# Patient Record
Sex: Female | Born: 1975
Health system: Southern US, Community
[De-identification: ages and names within clinical notes are randomized; demographics above are authoritative.]

## PROBLEM LIST (undated history)

## (undated) DIAGNOSIS — N390 Urinary tract infection, site not specified: Secondary | ICD-10-CM

## (undated) DIAGNOSIS — D649 Anemia, unspecified: Secondary | ICD-10-CM

## (undated) DIAGNOSIS — E079 Disorder of thyroid, unspecified: Secondary | ICD-10-CM

## (undated) DIAGNOSIS — U071 COVID-19: Secondary | ICD-10-CM

## (undated) DIAGNOSIS — K3580 Unspecified acute appendicitis: Principal | ICD-10-CM

## (undated) HISTORY — DX: Anemia, unspecified: D64.9

## (undated) HISTORY — DX: Urinary tract infection, site not specified: N39.0

## (undated) HISTORY — DX: COVID-19: U07.1

## (undated) HISTORY — PX: APPENDECTOMY: SHX54

## (undated) HISTORY — DX: Unspecified acute appendicitis: K35.80

## (undated) HISTORY — PX: COLONOSCOPY: SHX174

---

## 2002-10-19 ENCOUNTER — Inpatient Hospital Stay (HOSPITAL_COMMUNITY): Admission: AD | Admit: 2002-10-19 | Discharge: 2002-10-19 | Payer: Self-pay | Admitting: Family Medicine

## 2006-03-20 ENCOUNTER — Emergency Department (HOSPITAL_COMMUNITY): Admission: EM | Admit: 2006-03-20 | Discharge: 2006-03-21 | Payer: Self-pay | Admitting: Emergency Medicine

## 2006-08-26 ENCOUNTER — Inpatient Hospital Stay (HOSPITAL_COMMUNITY): Admission: AD | Admit: 2006-08-26 | Discharge: 2006-08-28 | Payer: Self-pay | Admitting: Obstetrics

## 2007-04-10 ENCOUNTER — Inpatient Hospital Stay (HOSPITAL_COMMUNITY): Admission: AD | Admit: 2007-04-10 | Discharge: 2007-04-10 | Payer: Self-pay | Admitting: Obstetrics & Gynecology

## 2007-10-05 ENCOUNTER — Inpatient Hospital Stay (HOSPITAL_COMMUNITY): Admission: AD | Admit: 2007-10-05 | Discharge: 2007-10-05 | Payer: Self-pay | Admitting: Obstetrics & Gynecology

## 2008-05-18 ENCOUNTER — Emergency Department (HOSPITAL_COMMUNITY): Admission: EM | Admit: 2008-05-18 | Discharge: 2008-05-19 | Payer: Self-pay | Admitting: Emergency Medicine

## 2008-05-18 ENCOUNTER — Inpatient Hospital Stay (HOSPITAL_COMMUNITY): Admission: AD | Admit: 2008-05-18 | Discharge: 2008-05-18 | Payer: Self-pay | Admitting: Obstetrics & Gynecology

## 2008-05-24 ENCOUNTER — Ambulatory Visit (HOSPITAL_COMMUNITY): Admission: RE | Admit: 2008-05-24 | Discharge: 2008-05-24 | Payer: Self-pay | Admitting: Family Medicine

## 2011-04-26 LAB — WET PREP, GENITAL: Trich, Wet Prep: NONE SEEN

## 2011-04-26 LAB — POCT PREGNANCY, URINE: Preg Test, Ur: NEGATIVE

## 2011-05-14 LAB — POCT PREGNANCY, URINE
Operator id: 22333
Preg Test, Ur: NEGATIVE

## 2011-05-14 LAB — WET PREP, GENITAL: Trich, Wet Prep: NONE SEEN

## 2012-01-13 DIAGNOSIS — K3580 Unspecified acute appendicitis: Secondary | ICD-10-CM

## 2012-01-13 HISTORY — DX: Unspecified acute appendicitis: K35.80

## 2012-01-17 ENCOUNTER — Ambulatory Visit (HOSPITAL_COMMUNITY)
Admission: EM | Admit: 2012-01-17 | Discharge: 2012-01-20 | Disposition: A | Discharge status: Home / Self Care | Payer: Medicaid Other | Source: Ambulatory Visit | Attending: Surgery | Admitting: Surgery

## 2012-01-17 ENCOUNTER — Encounter (HOSPITAL_COMMUNITY): Payer: Self-pay | Admitting: Emergency Medicine

## 2012-01-17 DIAGNOSIS — K37 Unspecified appendicitis: Secondary | ICD-10-CM

## 2012-01-17 DIAGNOSIS — K3580 Unspecified acute appendicitis: Secondary | ICD-10-CM

## 2012-01-17 DIAGNOSIS — R1013 Epigastric pain: Secondary | ICD-10-CM | POA: Insufficient documentation

## 2012-01-17 DIAGNOSIS — R1031 Right lower quadrant pain: Secondary | ICD-10-CM | POA: Insufficient documentation

## 2012-01-17 DIAGNOSIS — K8020 Calculus of gallbladder without cholecystitis without obstruction: Secondary | ICD-10-CM | POA: Insufficient documentation

## 2012-01-17 NOTE — ED Notes (Signed)
PT. REPORTS UPPER ABDOMINAL PAIN ONSET TODAY . VOMITTED TODAY DENIES ,  FEVER OR CHILLS.

## 2012-01-18 ENCOUNTER — Emergency Department (HOSPITAL_COMMUNITY): Payer: Medicaid Other

## 2012-01-18 ENCOUNTER — Encounter (HOSPITAL_COMMUNITY): Payer: Self-pay | Admitting: Anesthesiology

## 2012-01-18 ENCOUNTER — Encounter (HOSPITAL_COMMUNITY)
Admission: EM | Disposition: A | Discharge status: Home / Self Care | Payer: Self-pay | Source: Ambulatory Visit | Attending: Emergency Medicine

## 2012-01-18 ENCOUNTER — Encounter (HOSPITAL_COMMUNITY): Payer: Self-pay | Admitting: General Practice

## 2012-01-18 ENCOUNTER — Emergency Department (HOSPITAL_COMMUNITY): Payer: Medicaid Other | Admitting: Anesthesiology

## 2012-01-18 HISTORY — PX: LAPAROSCOPIC APPENDECTOMY: SHX408

## 2012-01-18 LAB — POCT PREGNANCY, URINE: Preg Test, Ur: NEGATIVE

## 2012-01-18 LAB — URINALYSIS, ROUTINE W REFLEX MICROSCOPIC
Bilirubin Urine: NEGATIVE
Ketones, ur: NEGATIVE mg/dL
Nitrite: NEGATIVE
Protein, ur: NEGATIVE mg/dL
Urobilinogen, UA: 0.2 mg/dL (ref 0.0–1.0)

## 2012-01-18 LAB — LIPASE, BLOOD: Lipase: 26 U/L (ref 11–59)

## 2012-01-18 LAB — DIFFERENTIAL
Basophils Relative: 0 % (ref 0–1)
Eosinophils Absolute: 0.1 10*3/uL (ref 0.0–0.7)
Eosinophils Relative: 1 % (ref 0–5)
Neutrophils Relative %: 84 % — ABNORMAL HIGH (ref 43–77)

## 2012-01-18 LAB — HEPATIC FUNCTION PANEL
AST: 24 U/L (ref 0–37)
Albumin: 3.8 g/dL (ref 3.5–5.2)
Bilirubin, Direct: <0.1 mg/dL (ref 0.0–0.3)
Total Bilirubin: 0.6 mg/dL (ref 0.3–1.2)

## 2012-01-18 LAB — BASIC METABOLIC PANEL
BUN: 12 mg/dL (ref 6–23)
Calcium: 9.1 mg/dL (ref 8.4–10.5)
GFR calc Af Amer: >90 mL/min (ref >90)
GFR calc non Af Amer: >90 mL/min (ref >90)
Potassium: 3.9 mEq/L (ref 3.5–5.1)
Sodium: 137 mEq/L (ref 135–145)

## 2012-01-18 LAB — CBC
MCH: 30.1 pg (ref 26.0–34.0)
MCHC: 33.9 g/dL (ref 30.0–36.0)
MCV: 88.7 fL (ref 78.0–100.0)
Platelets: 336 10*3/uL (ref 150–400)

## 2012-01-18 SURGERY — APPENDECTOMY, LAPAROSCOPIC
Anesthesia: General | Site: Abdomen | Wound class: Contaminated

## 2012-01-18 MED ORDER — LIDOCAINE HCL 4 % MT SOLN: OROMUCOSAL | Status: DC | PRN

## 2012-01-18 MED ORDER — NEOSTIGMINE METHYLSULFATE 1 MG/ML IJ SOLN
INTRAMUSCULAR | Status: DC | PRN
  Administered 2012-01-18: 1 mg via INTRAVENOUS
  Administered 2012-01-18: 2 mg via INTRAVENOUS

## 2012-01-18 MED ORDER — ERTAPENEM SODIUM 1 G IJ SOLR
1.0000 g | Freq: Once | INTRAMUSCULAR | Status: AC
  Administered 2012-01-18: 1 g via INTRAVENOUS
  Filled 2012-01-18: qty 1

## 2012-01-18 MED ORDER — 0.9 % SODIUM CHLORIDE (POUR BTL) OPTIME
TOPICAL | Status: DC | PRN
  Administered 2012-01-18: 1000 mL

## 2012-01-18 MED ORDER — DROPERIDOL 2.5 MG/ML IJ SOLN
INTRAMUSCULAR | Status: DC | PRN
  Administered 2012-01-18: 0.625 mg via INTRAVENOUS

## 2012-01-18 MED ORDER — GLYCOPYRROLATE 0.2 MG/ML IJ SOLN
INTRAMUSCULAR | Status: DC | PRN
  Administered 2012-01-18: 0.4 mg via INTRAVENOUS
  Administered 2012-01-18: 0.1 mg via INTRAVENOUS

## 2012-01-18 MED ORDER — IOHEXOL 300 MG/ML  SOLN: 20.0000 mL | INTRAMUSCULAR | Status: DC

## 2012-01-18 MED ORDER — PHENOL 1.4 % MT LIQD
1.0000 | OROMUCOSAL | Status: DC | PRN
  Filled 2012-01-18: qty 177

## 2012-01-18 MED ORDER — SODIUM CHLORIDE 0.9 % IV BOLUS (SEPSIS)
1000.0000 mL | Freq: Once | INTRAVENOUS | Status: AC
  Administered 2012-01-18: 1000 mL via INTRAVENOUS

## 2012-01-18 MED ORDER — OXYCODONE-ACETAMINOPHEN 5-325 MG PO TABS
1.0000 | ORAL_TABLET | ORAL | Status: DC | PRN
  Administered 2012-01-19: 2 via ORAL
  Administered 2012-01-19: 1 via ORAL
  Administered 2012-01-20: 2 via ORAL
  Filled 2012-01-18: qty 1
  Filled 2012-01-18 (×2): qty 2

## 2012-01-18 MED ORDER — LIDOCAINE HCL (CARDIAC) 20 MG/ML IV SOLN
INTRAVENOUS | Status: DC | PRN
  Administered 2012-01-18: 60 mg via INTRAVENOUS

## 2012-01-18 MED ORDER — ONDANSETRON HCL 4 MG/2ML IJ SOLN
4.0000 mg | Freq: Four times a day (QID) | INTRAMUSCULAR | Status: DC | PRN
  Administered 2012-01-18 – 2012-01-20 (×4): 4 mg via INTRAVENOUS
  Filled 2012-01-18 (×4): qty 2

## 2012-01-18 MED ORDER — HYDROMORPHONE HCL PF 1 MG/ML IJ SOLN
INTRAMUSCULAR | Status: AC
  Filled 2012-01-18: qty 1

## 2012-01-18 MED ORDER — SODIUM CHLORIDE 0.9 % IR SOLN
Status: DC | PRN
  Administered 2012-01-18: 1000 mL

## 2012-01-18 MED ORDER — FENTANYL CITRATE 0.05 MG/ML IJ SOLN
INTRAMUSCULAR | Status: DC | PRN
  Administered 2012-01-18: 150 ug via INTRAVENOUS

## 2012-01-18 MED ORDER — ONDANSETRON HCL 4 MG/2ML IJ SOLN
4.0000 mg | Freq: Once | INTRAMUSCULAR | Status: AC
  Administered 2012-01-18: 4 mg via INTRAVENOUS

## 2012-01-18 MED ORDER — ONDANSETRON HCL 4 MG/2ML IJ SOLN
4.0000 mg | Freq: Once | INTRAMUSCULAR | Status: AC
  Administered 2012-01-18: 4 mg via INTRAVENOUS
  Filled 2012-01-18: qty 2

## 2012-01-18 MED ORDER — ONDANSETRON HCL 4 MG/2ML IJ SOLN: 4.0000 mg | Freq: Once | INTRAMUSCULAR | Status: DC | PRN

## 2012-01-18 MED ORDER — ONDANSETRON HCL 4 MG/2ML IJ SOLN
INTRAMUSCULAR | Status: AC
  Administered 2012-01-18: 4 mg via INTRAVENOUS
  Filled 2012-01-18: qty 2

## 2012-01-18 MED ORDER — LACTATED RINGERS IV SOLN
INTRAVENOUS | Status: DC | PRN
  Administered 2012-01-18 (×2): via INTRAVENOUS

## 2012-01-18 MED ORDER — KCL IN DEXTROSE-NACL 20-5-0.45 MEQ/L-%-% IV SOLN
INTRAVENOUS | Status: AC
  Filled 2012-01-18: qty 1000

## 2012-01-18 MED ORDER — SUCCINYLCHOLINE CHLORIDE 20 MG/ML IJ SOLN
INTRAMUSCULAR | Status: DC | PRN
  Administered 2012-01-18: 60 mg via INTRAVENOUS

## 2012-01-18 MED ORDER — ONDANSETRON HCL 4 MG PO TABS
4.0000 mg | ORAL_TABLET | Freq: Four times a day (QID) | ORAL | Status: DC | PRN
  Administered 2012-01-19: 4 mg via ORAL
  Filled 2012-01-18: qty 1

## 2012-01-18 MED ORDER — PROPOFOL 10 MG/ML IV EMUL
INTRAVENOUS | Status: DC | PRN
  Administered 2012-01-18: 200 mg via INTRAVENOUS

## 2012-01-18 MED ORDER — HEPARIN SODIUM (PORCINE) 5000 UNIT/ML IJ SOLN
5000.0000 [IU] | Freq: Three times a day (TID) | INTRAMUSCULAR | Status: DC
  Administered 2012-01-19 – 2012-01-20 (×4): 5000 [IU] via SUBCUTANEOUS
  Filled 2012-01-18 (×7): qty 1

## 2012-01-18 MED ORDER — ROCURONIUM BROMIDE 100 MG/10ML IV SOLN
INTRAVENOUS | Status: DC | PRN
  Administered 2012-01-18: 30 mg via INTRAVENOUS

## 2012-01-18 MED ORDER — IOHEXOL 300 MG/ML  SOLN
80.0000 mL | Freq: Once | INTRAMUSCULAR | Status: AC | PRN
  Administered 2012-01-18: 80 mL via INTRAVENOUS

## 2012-01-18 MED ORDER — LIDOCAINE HCL 4 % MT SOLN
OROMUCOSAL | Status: DC | PRN
  Administered 2012-01-18: 4 mL via TOPICAL

## 2012-01-18 MED ORDER — KCL IN DEXTROSE-NACL 20-5-0.45 MEQ/L-%-% IV SOLN
INTRAVENOUS | Status: DC
  Administered 2012-01-19: 01:00:00 via INTRAVENOUS
  Filled 2012-01-18 (×3): qty 1000

## 2012-01-18 MED ORDER — HYDROMORPHONE HCL PF 1 MG/ML IJ SOLN
0.2500 mg | INTRAMUSCULAR | Status: DC | PRN
  Administered 2012-01-18 (×2): 0.5 mg via INTRAVENOUS

## 2012-01-18 MED ORDER — ONDANSETRON HCL 4 MG/2ML IJ SOLN
INTRAMUSCULAR | Status: DC | PRN
  Administered 2012-01-18 (×2): 4 mg via INTRAVENOUS

## 2012-01-18 MED ORDER — BUPIVACAINE HCL (PF) 0.25 % IJ SOLN
INTRAMUSCULAR | Status: DC | PRN
  Administered 2012-01-18: 18 mL

## 2012-01-18 MED ORDER — HYDROMORPHONE HCL PF 1 MG/ML IJ SOLN
2.0000 mg | INTRAMUSCULAR | Status: DC | PRN
  Administered 2012-01-18 – 2012-01-19 (×3): 2 mg via INTRAVENOUS
  Filled 2012-01-18: qty 2
  Filled 2012-01-18: qty 1
  Filled 2012-01-18: qty 2
  Filled 2012-01-18: qty 1

## 2012-01-18 MED ORDER — HYDROMORPHONE HCL PF 1 MG/ML IJ SOLN
1.0000 mg | Freq: Once | INTRAMUSCULAR | Status: AC
  Administered 2012-01-18: 1 mg via INTRAVENOUS
  Filled 2012-01-18: qty 1

## 2012-01-18 SURGICAL SUPPLY — 45 items
APPLIER CLIP 5 13 M/L LIGAMAX5 (MISCELLANEOUS)
BLADE SURG ROTATE 9660 (MISCELLANEOUS) IMPLANT
CANISTER SUCTION 2500CC (MISCELLANEOUS) ×2 IMPLANT
CHLORAPREP W/TINT 26ML (MISCELLANEOUS) ×2 IMPLANT
CLIP APPLIE 5 13 M/L LIGAMAX5 (MISCELLANEOUS) IMPLANT
CLOTH BEACON ORANGE TIMEOUT ST (SAFETY) ×2 IMPLANT
COVER SURGICAL LIGHT HANDLE (MISCELLANEOUS) ×2 IMPLANT
CUTTER LINEAR ENDO 35 ART FLEX (STAPLE) ×2 IMPLANT
DECANTER SPIKE VIAL GLASS SM (MISCELLANEOUS) IMPLANT
DERMABOND ADHESIVE PROPEN (GAUZE/BANDAGES/DRESSINGS) ×1
DERMABOND ADVANCED (GAUZE/BANDAGES/DRESSINGS) ×1
DERMABOND ADVANCED .7 DNX12 (GAUZE/BANDAGES/DRESSINGS) ×1 IMPLANT
DERMABOND ADVANCED .7 DNX6 (GAUZE/BANDAGES/DRESSINGS) ×1 IMPLANT
DRAPE UTILITY 15X26 W/TAPE STR (DRAPE) ×4 IMPLANT
ELECT REM PT RETURN 9FT ADLT (ELECTROSURGICAL) ×2
ELECTRODE REM PT RTRN 9FT ADLT (ELECTROSURGICAL) ×1 IMPLANT
ENDOLOOP SUT PDS II  0 18 (SUTURE)
ENDOLOOP SUT PDS II 0 18 (SUTURE) IMPLANT
GLOVE BIOGEL PI IND STRL 7.0 (GLOVE) ×1 IMPLANT
GLOVE BIOGEL PI IND STRL 7.5 (GLOVE) ×2 IMPLANT
GLOVE BIOGEL PI INDICATOR 7.0 (GLOVE) ×1
GLOVE BIOGEL PI INDICATOR 7.5 (GLOVE) ×2
GLOVE EUDERMIC 7 POWDERFREE (GLOVE) ×2 IMPLANT
GLOVE SURG SS PI 7.0 STRL IVOR (GLOVE) ×4 IMPLANT
GOWN PREVENTION PLUS XLARGE (GOWN DISPOSABLE) ×2 IMPLANT
GOWN STRL NON-REIN LRG LVL3 (GOWN DISPOSABLE) ×4 IMPLANT
KIT BASIN OR (CUSTOM PROCEDURE TRAY) ×2 IMPLANT
KIT ROOM TURNOVER OR (KITS) ×2 IMPLANT
NS IRRIG 1000ML POUR BTL (IV SOLUTION) ×2 IMPLANT
PAD ARMBOARD 7.5X6 YLW CONV (MISCELLANEOUS) ×4 IMPLANT
POUCH SPECIMEN RETRIEVAL 10MM (ENDOMECHANICALS) ×2 IMPLANT
RELOAD /EVU35 (ENDOMECHANICALS) IMPLANT
RELOAD CUTTER ETS 35MM STAND (ENDOMECHANICALS) IMPLANT
SCALPEL HARMONIC ACE (MISCELLANEOUS) ×2 IMPLANT
SET IRRIG TUBING LAPAROSCOPIC (IRRIGATION / IRRIGATOR) ×2 IMPLANT
SLEEVE ENDOPATH XCEL 5M (ENDOMECHANICALS) ×2 IMPLANT
SPECIMEN JAR SMALL (MISCELLANEOUS) ×2 IMPLANT
SUT MON AB 4-0 PC3 18 (SUTURE) ×2 IMPLANT
TOWEL OR 17X24 6PK STRL BLUE (TOWEL DISPOSABLE) ×2 IMPLANT
TOWEL OR 17X26 10 PK STRL BLUE (TOWEL DISPOSABLE) ×2 IMPLANT
TRAY FOLEY CATH 14FR (SET/KITS/TRAYS/PACK) ×2 IMPLANT
TRAY LAPAROSCOPIC (CUSTOM PROCEDURE TRAY) ×2 IMPLANT
TROCAR XCEL BLUNT TIP 100MML (ENDOMECHANICALS) ×2 IMPLANT
TROCAR XCEL NON-BLD 5MMX100MML (ENDOMECHANICALS) ×2 IMPLANT
WATER STERILE IRR 1000ML POUR (IV SOLUTION) IMPLANT

## 2012-01-18 NOTE — Op Note (Signed)
Kristen Holt 09/14/1975 [id number] 01/17/2012  Preoperative diagnosis: Acuta appendicitis   Postoperative diagnosis: Same  Procedure: Laparoscopic appendectomy  Surgeon: Currie Paris, MD, FACS   Anesthesia: General   Clinical History and Indications: Patient has been in her usual state of good health until about 2 days ago when she developed abdominal pain which has developed a localized to the right lower quadrant. She had right lower quadrant tenderness, and elevated white count, and a CT scan that was consistent with a very early appendicitis. Incidentally noted is gallstones without evidence of acute cholecystitis. After discussion with the patient she elected to proceed to laparoscopic appendectomy    Description of Procedure: I saw the patient the preoperative area and we confirmed the plans. She had no further questions. She was then taken to the operating room and after satisfactory general and tracheal anesthesia had been obtained a Foley catheter was placed and the abdomen was prepped and draped. A timeout was done.  0.25% plain Marcaine was used for each incision. I made an umbilical incision identified and opened the fascia and entered the peritoneal cavity under direct vision. A pursestring was placed and the Hassan cannula introduced and the abdomen insufflated to 15. Initial exam the admission Zocor 7 ounces. The appendix was not immediately visualized. The gallbladder Appeared to be normal without evidence of acute inflammation.Under direct vision a 5 mm trocar was placed in the right upper quadrant and another one in the left lower quadrant. The camera was then placed in a left lower quadrant port.  The appendix was then able to be visualized I placed the patient in Trendelenburg tilt to the left. It appeared early appendicitis with some edema of the appendix. I was able to grasp it elevated. Using the harmonic scalpel I was able to divide the mesentery down to  the base. The GIA was placed and fired across the base. He takes is placed in the bag. I appear to have a Secure closure of the appendiceal base. There is no bleeding. Appendix and removed Through the umbilicus port. I reinsufflated and a final check for hemostasis. The abdomen was then deflated after the ports were removed. The pursestring was used to close the umbilicus. 4-0 Monocryl subcuticulars use for all the skin incisions.  The patient tolerated the procedure well and there no operative complications. All counts are correct.  Currie Paris, MD, FACS 01/18/2012 10:03 AM

## 2012-01-18 NOTE — ED Provider Notes (Signed)
History     CSN: [id number]  Arrival date & time 01/17/12  2338   First MD Initiated Contact with Patient 01/18/12 0241      Chief Complaint  Patient presents with  . Abdominal Pain    (Consider location/radiation/quality/duration/timing/severity/associated sxs/prior treatment) HPI Comments: Patient is English speaking hispanic lady who presents with a day history of peri-umbilical abdominal pain with nausea and vomiting which started tonight - reports no fever, chills, states that the pain has now radiated to epigastric and RLQ abdominal area.  Reports vomiting 4 times NBNB vomit.  Denies constipation, diarrhea, abdominal surgeries, vaginal discharge, is currently on menstrual cycle which is normal, pregnancy, dysuria, hematuria.  Patient is a 36 y.o. female presenting with abdominal pain. The history is provided by the patient. No language interpreter was used.  Abdominal Pain The primary symptoms of the illness include abdominal pain, nausea and vomiting. The primary symptoms of the illness do not include fever, fatigue, shortness of breath, diarrhea, hematemesis, hematochezia, dysuria, vaginal discharge or vaginal bleeding. The current episode started 6 to 12 hours ago. The onset of the illness was gradual. The problem has been gradually worsening.  The patient states that she believes she is currently not pregnant. The patient has not had a change in bowel habit. Additional symptoms associated with the illness include anorexia. Symptoms associated with the illness do not include chills, diaphoresis, heartburn, constipation, urgency, hematuria, frequency or back pain.    History reviewed. No pertinent past medical history.  History reviewed. No pertinent past surgical history.  No family history on file.  History  Substance Use Topics  . Smoking status: Never Smoker   . Smokeless tobacco: Not on file  . Alcohol Use: No    OB History    Grav Para Term Preterm Abortions TAB SAB  Ect Mult Living                  Review of Systems  Constitutional: Negative for fever, chills, diaphoresis and fatigue.  Respiratory: Negative for shortness of breath.   Gastrointestinal: Positive for nausea, vomiting, abdominal pain and anorexia. Negative for heartburn, diarrhea, constipation, hematochezia and hematemesis.  Genitourinary: Negative for dysuria, urgency, frequency, hematuria, vaginal bleeding and vaginal discharge.  Musculoskeletal: Negative for back pain.  All other systems reviewed and are negative.    Allergies  Review of patient's allergies indicates no known allergies.  Home Medications   Current Outpatient Rx  Name Route Sig Dispense Refill  . EMETROL PO Oral Take 5 mLs by mouth 2 (two) times daily as needed. For nausea      BP 99/61  Pulse 78  Temp 98.3 F (36.8 C)  Resp 18  SpO2 100%  LMP 01/14/2012  Physical Exam  Nursing note and vitals reviewed. Constitutional: She is oriented to person, place, and time. She appears well-developed and well-nourished. She appears distressed.       Uncomfortable appearing  HENT:  Head: Normocephalic and atraumatic.  Right Ear: External ear normal.  Left Ear: External ear normal.  Nose: Nose normal.  Mouth/Throat: Oropharynx is clear and moist. No oropharyngeal exudate.  Eyes: Conjunctivae are normal. Pupils are equal, round, and reactive to light. No scleral icterus.  Neck: Normal range of motion. Neck supple.  Cardiovascular: Normal rate, regular rhythm and normal heart sounds.  Exam reveals no gallop and no friction rub.   No murmur heard. Pulmonary/Chest: Effort normal and breath sounds normal. No respiratory distress. She has no wheezes. She has no rales.  She exhibits no tenderness.  Abdominal: Soft. Bowel sounds are normal. She exhibits no distension and no mass. There is tenderness. There is guarding. There is no rebound.    Musculoskeletal: Normal range of motion. She exhibits no edema and no  tenderness.  Lymphadenopathy:    She has no cervical adenopathy.  Neurological: She is alert and oriented to person, place, and time. No cranial nerve deficit.  Skin: Skin is warm and dry. No rash noted. No erythema. No pallor.  Psychiatric: She has a normal mood and affect. Her behavior is normal. Judgment and thought content normal.    ED Course  Procedures (including critical care time)  Labs Reviewed  CBC - Abnormal; Notable for the following:    WBC 17.8 (*)     All other components within normal limits  DIFFERENTIAL - Abnormal; Notable for the following:    Neutrophils Relative 84 (*)     Neutro Abs 15.0 (*)     Lymphocytes Relative 11 (*)     All other components within normal limits  BASIC METABOLIC PANEL - Abnormal; Notable for the following:    Glucose, Bld 125 (*)     All other components within normal limits  URINALYSIS, ROUTINE W REFLEX MICROSCOPIC  HEPATIC FUNCTION PANEL  LIPASE, BLOOD  POCT PREGNANCY, URINE   US Abdomen Complete  01/18/2012  *RADIOLOGY REPORT*  Clinical Data:  Abdominal pain.  ABDOMINAL ULTRASOUND COMPLETE  Comparison:  None  Findings:  Gallbladder:  A single 1.0 cm stone is noted within the gallbladder; this is mobile, without evidence for obstruction.  No gallbladder wall thickening or pericholecystic fluid is seen to suggest cholecystitis.  No ultrasonographic Murphy's sign is elicited.  Common Bile Duct:  0.3 cm in diameter; within normal limits in caliber, though difficult to fully characterize.  Liver:  Normal parenchymal echogenicity and echotexture; no focal lesions identified.  Limited Doppler evaluation demonstrates normal blood flow within the liver.  IVC:  Unremarkable in appearance.  Pancreas:  Not well visualized due to overlying bowel gas.  Spleen:  8.2 cm in length; within normal limits in size and echotexture.  Right kidney:  9.2 cm in length; normal in size, configuration and parenchymal echogenicity.  No evidence of mass or  hydronephrosis.  Left kidney:  11.4 cm in length; normal in size, configuration and parenchymal echogenicity.  No evidence of mass or hydronephrosis.  Abdominal Aorta:  Normal in caliber; no aneurysm identified.  IMPRESSION:  1.  No acute abnormalities seen within the abdomen. 2.  Cholelithiasis, without evidence for obstruction or cholecystitis.  Original Report Authenticated By: Tonia Ghent, M.D.   Results for orders placed during the hospital encounter of 01/17/12  URINALYSIS, ROUTINE W REFLEX MICROSCOPIC      Component Value Range   Color, Urine YELLOW  YELLOW   APPearance CLEAR  CLEAR   Specific Gravity, Urine 1.014  1.005 - 1.030   pH 7.0  5.0 - 8.0   Glucose, UA NEGATIVE  NEGATIVE mg/dL   Hgb urine dipstick NEGATIVE  NEGATIVE   Bilirubin Urine NEGATIVE  NEGATIVE   Ketones, ur NEGATIVE  NEGATIVE mg/dL   Protein, ur NEGATIVE  NEGATIVE mg/dL   Urobilinogen, UA 0.2  0.0 - 1.0 mg/dL   Nitrite NEGATIVE  NEGATIVE   Leukocytes, UA NEGATIVE  NEGATIVE  CBC      Component Value Range   WBC 17.8 (*) 4.0 - 10.5 K/uL   RBC 4.52  3.87 - 5.11 MIL/uL   Hemoglobin 13.6  12.0 - 15.0 g/dL   HCT 65.7  84.6 - 96.2 %   MCV 88.7  78.0 - 100.0 fL   MCH 30.1  26.0 - 34.0 pg   MCHC 33.9  30.0 - 36.0 g/dL   RDW 95.2  84.1 - 32.4 %   Platelets 336  150 - 400 K/uL  DIFFERENTIAL      Component Value Range   Neutrophils Relative 84 (*) 43 - 77 %   Neutro Abs 15.0 (*) 1.7 - 7.7 K/uL   Lymphocytes Relative 11 (*) 12 - 46 %   Lymphs Abs 1.9  0.7 - 4.0 K/uL   Monocytes Relative 5  3 - 12 %   Monocytes Absolute 0.8  0.1 - 1.0 K/uL   Eosinophils Relative 1  0 - 5 %   Eosinophils Absolute 0.1  0.0 - 0.7 K/uL   Basophils Relative 0  0 - 1 %   Basophils Absolute 0.0  0.0 - 0.1 K/uL  BASIC METABOLIC PANEL      Component Value Range   Sodium 137  135 - 145 mEq/L   Potassium 3.9  3.5 - 5.1 mEq/L   Chloride 101  96 - 112 mEq/L   CO2 25  19 - 32 mEq/L   Glucose, Bld 125 (*) 70 - 99 mg/dL   BUN 12  6 - 23  mg/dL   Creatinine, Ser 4.01  0.50 - 1.10 mg/dL   Calcium 9.1  8.4 - 02.7 mg/dL   GFR calc non Af Amer >90  >90 mL/min   GFR calc Af Amer >90  >90 mL/min  HEPATIC FUNCTION PANEL      Component Value Range   Total Protein 7.2  6.0 - 8.3 g/dL   Albumin 3.8  3.5 - 5.2 g/dL   AST 24  0 - 37 U/L   ALT 18  0 - 35 U/L   Alkaline Phosphatase 72  39 - 117 U/L   Total Bilirubin 0.6  0.3 - 1.2 mg/dL   Bilirubin, Direct <2.5  0.0 - 0.3 mg/dL   Indirect Bilirubin NOT CALCULATED  0.3 - 0.9 mg/dL  LIPASE, BLOOD      Component Value Range   Lipase 26  11 - 59 U/L  POCT PREGNANCY, URINE      Component Value Range   Preg Test, Ur NEGATIVE  NEGATIVE   US Abdomen Complete  01/18/2012  *RADIOLOGY REPORT*  Clinical Data:  Abdominal pain.  ABDOMINAL ULTRASOUND COMPLETE  Comparison:  None  Findings:  Gallbladder:  A single 1.0 cm stone is noted within the gallbladder; this is mobile, without evidence for obstruction.  No gallbladder wall thickening or pericholecystic fluid is seen to suggest cholecystitis.  No ultrasonographic Murphy's sign is elicited.  Common Bile Duct:  0.3 cm in diameter; within normal limits in caliber, though difficult to fully characterize.  Liver:  Normal parenchymal echogenicity and echotexture; no focal lesions identified.  Limited Doppler evaluation demonstrates normal blood flow within the liver.  IVC:  Unremarkable in appearance.  Pancreas:  Not well visualized due to overlying bowel gas.  Spleen:  8.2 cm in length; within normal limits in size and echotexture.  Right kidney:  9.2 cm in length; normal in size, configuration and parenchymal echogenicity.  No evidence of mass or hydronephrosis.  Left kidney:  11.4 cm in length; normal in size, configuration and parenchymal echogenicity.  No evidence of mass or hydronephrosis.  Abdominal Aorta:  Normal in caliber; no aneurysm identified.  IMPRESSION:  1.  No acute abnormalities seen within the abdomen. 2.  Cholelithiasis, without evidence  for obstruction or cholecystitis.  Original Report Authenticated By: Tonia Ghent, M.D.      Acute early appendicitis   MDM  Patient here with peri-umbilical and RLQ abdominal pain with leukocytosis and vomiting - CT scan reads early appendicitis - Dr. Norlene Campbell will be contacting Dr. Janee Morn with surgery.        Izola Price [place], [place] 01/18/12 1610  7:24 AM Spoke with Dr. Jamey Ripa with general surgery who will admit the patient.  Izola Price [place], [place] 01/18/12 [phone]

## 2012-01-18 NOTE — Preoperative (Signed)
Beta Blockers   Reason not to administer Beta Blockers:Not Applicable 

## 2012-01-18 NOTE — Anesthesia Procedure Notes (Signed)
Procedure Name: Intubation Date/Time: 01/18/2012 9:14 AM Performed by: Quentin Ore Pre-anesthesia Checklist: Patient identified, Emergency Drugs available, Suction available, Patient being monitored and Timeout performed Patient Re-evaluated:Patient Re-evaluated prior to inductionOxygen Delivery Method: Circle system utilized Preoxygenation: Pre-oxygenation with 100% oxygen Intubation Type: Rapid sequence and Cricoid Pressure applied Laryngoscope Size: Mac and 3 Grade View: Grade I Tube type: Oral Tube size: 7.5 mm Number of attempts: 1 Airway Equipment and Method: Stylet and LTA kit utilized Placement Confirmation: ETT inserted through vocal cords under direct vision,  positive ETCO2 and breath sounds checked- equal and bilateral Secured at: 20 cm Tube secured with: Tape Dental Injury: Teeth and Oropharynx as per pre-operative assessment

## 2012-01-18 NOTE — ED Provider Notes (Signed)
Medical screening examination/treatment/procedure(s) were conducted as a shared visit with non-physician practitioner(s) and myself.  I personally evaluated the patient during the encounter.  Pt with onset of pain today initially more diffuse now mainly on right lower side.  No urinary or gi or GU signs on exam.  CT scan shows appendicitis  TO be admitted to surgery for next lap appy  Olivia Mackie, MD 01/18/12 1036

## 2012-01-18 NOTE — Anesthesia Postprocedure Evaluation (Signed)
  Anesthesia Post-op Note  Patient: Kristen Holt  Procedure(s) Performed: Procedure(s) (LRB): APPENDECTOMY LAPAROSCOPIC (N/A)  Patient Location: PACU  Anesthesia Type: General  Level of Consciousness: awake, alert , oriented and patient cooperative  Airway and Oxygen Therapy: Patient Spontanous Breathing and Patient connected to nasal cannula oxygen  Post-op Pain: mild  Post-op Assessment: Post-op Vital signs reviewed, Patient's Cardiovascular Status Stable, Respiratory Function Stable, Patent Airway, No signs of Nausea or vomiting and Pain level controlled  Post-op Vital Signs: stable  Complications: No apparent anesthesia complications

## 2012-01-18 NOTE — H&P (Signed)
Kristen Holt 09/14/1975  [id number].   Primary Care MD:  Requesting MD: Dr. Norlene Campbell Chief Complaint/Reason for Consult: Abdominal pain with nausea and vomiting HPI: Patient is a 35 yr old female who presents to the [facility] with a 2 day history of abdiminal pain with nausea and vomiting.  The pain started as more generalized and has now become more umbilicus and right lower quadrant.  She has also had chills but no fevers.  She denies any other symptoms.  CT evaluation shows appendicitis and lab work shows leukocytosis.  Review of Systems: Review of systems is negative except as indicated in HPI  No family history on file.  History reviewed. No pertinent past medical history.  History reviewed. No pertinent past surgical history.  Social History:  reports that she has never smoked. She does not have any smokeless tobacco history on file. She reports that she does not drink alcohol or use illicit drugs.  Allergies: No Known Allergies   (Not in a hospital admission)  Blood pressure 101/56, pulse 79, temperature 97.7 F (36.5 C), temperature source Oral, resp. rate 16, last menstrual period 01/14/2012, SpO2 99.00%. Physical Exam: General:  WDWN in NAD.  Pleasant and cooperative.  HEENT:  NCAT, EOMI, no icterus.  NECK:  Supple, no obvious mass or thyroid enlargement.  CV:  RRR, no murmur, no JVD.  CHEST:  No scars.  RESPIRATORY:  Breath sounds equal and clear. Respirations nonlabored.  ABDOMEN:  Soft, tender in umbilicus and right lower quad, nondistended, no masses, no organomegaly, active bowel sounds, no scars, no hernias.  ANORECTAL:  Deferred.  GU:  Deferred.  MUSCULOSKELETAL:  FROM, good muscle tone, no edema, no venous stasis changes  LYMPHATIC: No palpable cervical, supraclavicular, axillary adenopathy.  SKIN:  No jaundice or suspicious rashes.  NEUROLOGIC:  Alert and oriented, answers questions appropriately, moves all four extremities  equally    Results for orders placed during the hospital encounter of 01/17/12 (from the past 48 hour(s))  CBC     Status: Abnormal   Collection Time   01/17/12 11:46 PM      Component Value Range Comment   WBC 17.8 (*) 4.0 - 10.5 K/uL    RBC 4.52  3.87 - 5.11 MIL/uL    Hemoglobin 13.6  12.0 - 15.0 g/dL    HCT 40.9  81.1 - 91.4 %    MCV 88.7  78.0 - 100.0 fL    MCH 30.1  26.0 - 34.0 pg    MCHC 33.9  30.0 - 36.0 g/dL    RDW 78.2  95.6 - 21.3 %    Platelets 336  150 - 400 K/uL   DIFFERENTIAL     Status: Abnormal   Collection Time   01/17/12 11:46 PM      Component Value Range Comment   Neutrophils Relative 84 (*) 43 - 77 %    Neutro Abs 15.0 (*) 1.7 - 7.7 K/uL    Lymphocytes Relative 11 (*) 12 - 46 %    Lymphs Abs 1.9  0.7 - 4.0 K/uL    Monocytes Relative 5  3 - 12 %    Monocytes Absolute 0.8  0.1 - 1.0 K/uL    Eosinophils Relative 1  0 - 5 %    Eosinophils Absolute 0.1  0.0 - 0.7 K/uL    Basophils Relative 0  0 - 1 %    Basophils Absolute 0.0  0.0 - 0.1 K/uL   BASIC METABOLIC PANEL     Status:  Abnormal   Collection Time   01/17/12 11:46 PM      Component Value Range Comment   Sodium 137  135 - 145 mEq/L    Potassium 3.9  3.5 - 5.1 mEq/L    Chloride 101  96 - 112 mEq/L    CO2 25  19 - 32 mEq/L    Glucose, Bld 125 (*) 70 - 99 mg/dL    BUN 12  6 - 23 mg/dL    Creatinine, Ser 1.61  0.50 - 1.10 mg/dL    Calcium 9.1  8.4 - 09.6 mg/dL    GFR calc non Af Amer >90  >90 mL/min    GFR calc Af Amer >90  >90 mL/min   HEPATIC FUNCTION PANEL     Status: Normal   Collection Time   01/17/12 11:49 PM      Component Value Range Comment   Total Protein 7.2  6.0 - 8.3 g/dL    Albumin 3.8  3.5 - 5.2 g/dL    AST 24  0 - 37 U/L HEMOLYSIS AT THIS LEVEL MAY AFFECT RESULT   ALT 18  0 - 35 U/L    Alkaline Phosphatase 72  39 - 117 U/L    Total Bilirubin 0.6  0.3 - 1.2 mg/dL    Bilirubin, Direct <0.4  0.0 - 0.3 mg/dL    Indirect Bilirubin NOT CALCULATED  0.3 - 0.9 mg/dL   LIPASE, BLOOD      Status: Normal   Collection Time   01/17/12 11:49 PM      Component Value Range Comment   Lipase 26  11 - 59 U/L   URINALYSIS, ROUTINE W REFLEX MICROSCOPIC     Status: Normal   Collection Time   01/18/12  3:17 AM      Component Value Range Comment   Color, Urine YELLOW  YELLOW    APPearance CLEAR  CLEAR    Specific Gravity, Urine 1.014  1.005 - 1.030    pH 7.0  5.0 - 8.0    Glucose, UA NEGATIVE  NEGATIVE mg/dL    Hgb urine dipstick NEGATIVE  NEGATIVE    Bilirubin Urine NEGATIVE  NEGATIVE    Ketones, ur NEGATIVE  NEGATIVE mg/dL    Protein, ur NEGATIVE  NEGATIVE mg/dL    Urobilinogen, UA 0.2  0.0 - 1.0 mg/dL    Nitrite NEGATIVE  NEGATIVE    Leukocytes, UA NEGATIVE  NEGATIVE MICROSCOPIC NOT DONE ON URINES WITH NEGATIVE PROTEIN, BLOOD, LEUKOCYTES, NITRITE, OR GLUCOSE <1000 mg/dL.  POCT PREGNANCY, URINE     Status: Normal   Collection Time   01/18/12  3:21 AM      Component Value Range Comment   Preg Test, Ur NEGATIVE  NEGATIVE    US Abdomen Complete  01/18/2012  *RADIOLOGY REPORT*  Clinical Data:  Abdominal pain.  ABDOMINAL ULTRASOUND COMPLETE  Comparison:  None  Findings:  Gallbladder:  A single 1.0 cm stone is noted within the gallbladder; this is mobile, without evidence for obstruction.  No gallbladder wall thickening or pericholecystic fluid is seen to suggest cholecystitis.  No ultrasonographic Murphy's sign is elicited.  Common Bile Duct:  0.3 cm in diameter; within normal limits in caliber, though difficult to fully characterize.  Liver:  Normal parenchymal echogenicity and echotexture; no focal lesions identified.  Limited Doppler evaluation demonstrates normal blood flow within the liver.  IVC:  Unremarkable in appearance.  Pancreas:  Not well visualized due to overlying bowel gas.  Spleen:  8.2 cm  in length; within normal limits in size and echotexture.  Right kidney:  9.2 cm in length; normal in size, configuration and parenchymal echogenicity.  No evidence of mass or hydronephrosis.   Left kidney:  11.4 cm in length; normal in size, configuration and parenchymal echogenicity.  No evidence of mass or hydronephrosis.  Abdominal Aorta:  Normal in caliber; no aneurysm identified.  IMPRESSION:  1.  No acute abnormalities seen within the abdomen. 2.  Cholelithiasis, without evidence for obstruction or cholecystitis.  Original Report Authenticated By: Tonia Ghent, M.D.   Ct Abdomen Pelvis W Contrast  01/18/2012  *RADIOLOGY REPORT*  Clinical Data: Lower abdominal pain; nausea, vomiting and chills.  CT ABDOMEN AND PELVIS WITH CONTRAST  Technique:  Multidetector CT imaging of the abdomen and pelvis was performed following the standard protocol during bolus administration of intravenous contrast.  Contrast: 80mL OMNIPAQUE IOHEXOL 300 MG/ML  SOLN  Comparison: Abdominal ultrasound performed earlier today at 01:53 a.m.  Findings: Mild bibasilar atelectasis is noted.  The liver and spleen are unremarkable in appearance.  A few tiny stones are noted layering dependently within the gallbladder.  The gallbladder is otherwise unremarkable in appearance.  The pancreas and adrenal glands are unremarkable.  The kidneys are unremarkable in appearance.  There is no evidence of hydronephrosis.  No renal or ureteral stones are seen.  No perinephric stranding is appreciated.  No free fluid is identified.  The small bowel is unremarkable in appearance.  The stomach is within normal limits.  No acute vascular abnormalities are seen.  The appendix appears mildly dilated, measuring up to 0.9 cm in diameter, with mild associated soft tissue inflammation, suggestive of mild acute appendicitis.  Mild adjacent cecal wall thickening is noted.  No significant free fluid is seen.  There is no evidence for perforation or abscess formation.  The colon is unremarkable in appearance.  The bladder is mildly distended and grossly unremarkable in appearance.  The uterus is within normal limits.  The ovaries are relatively symmetric;  no suspicious adnexal masses are seen.  No inguinal lymphadenopathy is seen.  No acute osseous abnormalities are identified.  IMPRESSION:  1.  Mild dilatation of the appendix to 0.9 cm in diameter, with mild soft tissue inflammation and adjacent thickening of the cecal wall.  This most likely reflects mild appendicitis.  No evidence for perforation or abscess formation.  No free fluid seen. 2.  Cholelithiasis noted; gallbladder otherwise unremarkable in appearance. 3.  Mild bibasilar atelectasis noted.  These results were called by telephone on 01/18/2012  at  07:03 a.m. to  [facility], who verbally acknowledged these results.  Original Report Authenticated By: Tonia Ghent, M.D.       Assessment/Plan 1.  Acute Appendicitis with leukocytosis: the patient will be admitted and taken to the OR for laparoscopic appendectomy.  RBA discussed with patient including bleeding, infection, bowel injury, need for conversion to open procedure.  She expresses understanding and wishes to proceed.  Interpreter was offered to the patient but she states that she does understand the situation but will ask if needed to have one present.  The patient will be seen by Dr. Jamey Ripa prior to surgery.  Kristen Holt 01/18/2012, 7:49 AM

## 2012-01-18 NOTE — ED Notes (Signed)
Contrast given to patient

## 2012-01-18 NOTE — ED Notes (Signed)
CT made aware that pt is complete with contrast.

## 2012-01-18 NOTE — Transfer of Care (Signed)
Immediate Anesthesia Transfer of Care Note  Patient: Kristen Holt  Procedure(s) Performed: Procedure(s) (LRB): APPENDECTOMY LAPAROSCOPIC (N/A)  Patient Location: PACU  Anesthesia Type: General  Level of Consciousness: awake, alert  and oriented  Airway & Oxygen Therapy: Patient Spontanous Breathing and Patient connected to nasal cannula oxygen  Post-op Assessment: Report given to PACU RN, Post -op Vital signs reviewed and stable and Patient moving all extremities X 4  Post vital signs: Reviewed and stable  Complications: No apparent anesthesia complications

## 2012-01-18 NOTE — H&P (Signed)
Patient seen and examined and agree with findings of EW, PA. This appears to be an early appendicitis by CT. She also has gallstones, so will look at GB at time of surgery.Reviewed the plans with the patient who understands and agrees. All questions answered

## 2012-01-18 NOTE — ED Notes (Signed)
Patient was brought to the ED by family with complaints of Epigastric pain.  Patient has had 4 incidents of vomitus and no diarrhea.  Patient rates her pain at an 8.

## 2012-01-18 NOTE — Anesthesia Preprocedure Evaluation (Addendum)
Anesthesia Evaluation  Patient identified by MRN, date of birth, ID band Patient awake    Reviewed: Allergy & Precautions, H&P , NPO status , Patient's Chart, lab work & pertinent test results  Airway Mallampati: I TM Distance: >3 FB Neck ROM: full    Dental  (+) Teeth Intact   Pulmonary          Cardiovascular Rhythm:regular Rate:Normal     Neuro/Psych    GI/Hepatic   Endo/Other    Renal/GU      Musculoskeletal   Abdominal   Peds  Hematology   Anesthesia Other Findings   Reproductive/Obstetrics                         Anesthesia Physical Anesthesia Plan  ASA: I  Anesthesia Plan: General   Post-op Pain Management:    Induction: Intravenous  Airway Management Planned: Oral ETT  Additional Equipment:   Intra-op Plan:   Post-operative Plan: Extubation in OR  Informed Consent: I have reviewed the patients History and Physical, chart, labs and discussed the procedure including the risks, benefits and alternatives for the proposed anesthesia with the patient or authorized representative who has indicated his/her understanding and acceptance.     Plan Discussed with: CRNA, Anesthesiologist and Surgeon  Anesthesia Plan Comments:         Anesthesia Quick Evaluation  

## 2012-01-19 ENCOUNTER — Encounter (HOSPITAL_COMMUNITY): Payer: Self-pay | Admitting: Surgery

## 2012-01-19 LAB — CBC
HCT: 36.5 % (ref 36.0–46.0)
Hemoglobin: 12.1 g/dL (ref 12.0–15.0)
MCH: 29.8 pg (ref 26.0–34.0)
MCHC: 33.2 g/dL (ref 30.0–36.0)
MCV: 89.9 fL (ref 78.0–100.0)

## 2012-01-19 MED ORDER — PROMETHAZINE HCL 25 MG/ML IJ SOLN
25.0000 mg | Freq: Four times a day (QID) | INTRAMUSCULAR | Status: DC | PRN
  Administered 2012-01-19: 25 mg via INTRAVENOUS
  Filled 2012-01-19: qty 1

## 2012-01-19 MED FILL — Bupivacaine HCl Preservative Free (PF) Inj 0.25%: INTRAMUSCULAR | Qty: 30 | Status: AC

## 2012-01-19 NOTE — Progress Notes (Signed)
Pt has been voiding very small amts since surgery. Unable to void now. I&O cath done. 700 cc clear urine removed. Still nauseated after 2 doses of Zofran. Diet changed back to clear liquids, and MD notified.Irena Reichmann 01/19/2012

## 2012-01-19 NOTE — Progress Notes (Signed)
Interpreter Wyvonnia Dusky for Morrie Sheldon, Financial asstance

## 2012-01-19 NOTE — Progress Notes (Signed)
Vomited X1 since seen earlier. Not much pain. VS: BP 89/68  Pulse 63  Temp 98.2 F (36.8 C) (Oral)  Resp 18  Ht 5' (1.524 m)  Wt 149 lb 14.4 oz (67.994 kg)  BMI 29.28 kg/m2  SpO2 99%  LMP 01/14/2012  Alert, NAD Abd soft and not tender except incision.  Post op nausea - likely will resolve later today

## 2012-01-19 NOTE — Progress Notes (Signed)
1 Day Post-Op  Subjective: Generally feels well today,  Some episodes of NV last pm, but has been taking liquid diet.   Objective: Vital signs in last 24 hours: Temp:  [97 F (36.1 C)-98.2 F (36.8 C)] 98.2 F (36.8 C) (06/19 0525) Pulse Rate:  [59-88] 63  (06/19 0525) Resp:  [12-21] 18  (06/19 0525) BP: (88-108)/(43-77) 89/68 mmHg (06/19 0525) SpO2:  [93 %-100 %] 99 % (06/19 0525) Weight:  [149 lb 14.4 oz (67.994 kg)] 149 lb 14.4 oz (67.994 kg) (06/18 1200) Last BM Date: 01/17/12  Intake/Output from previous day: 06/18 0701 - 06/19 0700 In: 1625 [I.V.:1625] Out: 510 [Urine:500; Blood:10] Intake/Output this shift:    General appearance: alert, cooperative and no distress Abdomen: all surgical wounds appear to be healing well, no eccymosis,drng seen. No bloating, positive BS, no flatus reported, No BM to date. Currently no report of NV.   Lab Results:   Basename 01/17/12 2346  WBC 17.8*  HGB 13.6  HCT 40.1  PLT 336   BMET  Basename 01/17/12 2346  NA 137  K 3.9  CL 101  CO2 25  GLUCOSE 125*  BUN 12  CREATININE 0.57  CALCIUM 9.1   PT/INR No results found for this basename: LABPROT:2,INR:2 in the last 72 hours ABG No results found for this basename: PHART:2,PCO2:2,PO2:2,HCO3:2 in the last 72 hours  Studies/Results: US Abdomen Complete  01/18/2012  *RADIOLOGY REPORT*  Clinical Data:  Abdominal pain.  ABDOMINAL ULTRASOUND COMPLETE  Comparison:  None  Findings:  Gallbladder:  A single 1.0 cm stone is noted within the gallbladder; this is mobile, without evidence for obstruction.  No gallbladder wall thickening or pericholecystic fluid is seen to suggest cholecystitis.  No ultrasonographic Murphy's sign is elicited.  Common Bile Duct:  0.3 cm in diameter; within normal limits in caliber, though difficult to fully characterize.  Liver:  Normal parenchymal echogenicity and echotexture; no focal lesions identified.  Limited Doppler evaluation demonstrates normal blood flow  within the liver.  IVC:  Unremarkable in appearance.  Pancreas:  Not well visualized due to overlying bowel gas.  Spleen:  8.2 cm in length; within normal limits in size and echotexture.  Right kidney:  9.2 cm in length; normal in size, configuration and parenchymal echogenicity.  No evidence of mass or hydronephrosis.  Left kidney:  11.4 cm in length; normal in size, configuration and parenchymal echogenicity.  No evidence of mass or hydronephrosis.  Abdominal Aorta:  Normal in caliber; no aneurysm identified.  IMPRESSION:  1.  No acute abnormalities seen within the abdomen. 2.  Cholelithiasis, without evidence for obstruction or cholecystitis.  Original Report Authenticated By: Tonia Ghent, M.D.   Ct Abdomen Pelvis W Contrast  01/18/2012  *RADIOLOGY REPORT*  Clinical Data: Lower abdominal pain; nausea, vomiting and chills.  CT ABDOMEN AND PELVIS WITH CONTRAST  Technique:  Multidetector CT imaging of the abdomen and pelvis was performed following the standard protocol during bolus administration of intravenous contrast.  Contrast: 80mL OMNIPAQUE IOHEXOL 300 MG/ML  SOLN  Comparison: Abdominal ultrasound performed earlier today at 01:53 a.m.  Findings: Mild bibasilar atelectasis is noted.  The liver and spleen are unremarkable in appearance.  A few tiny stones are noted layering dependently within the gallbladder.  The gallbladder is otherwise unremarkable in appearance.  The pancreas and adrenal glands are unremarkable.  The kidneys are unremarkable in appearance.  There is no evidence of hydronephrosis.  No renal or ureteral stones are seen.  No perinephric stranding is appreciated.  No free fluid is identified.  The small bowel is unremarkable in appearance.  The stomach is within normal limits.  No acute vascular abnormalities are seen.  The appendix appears mildly dilated, measuring up to 0.9 cm in diameter, with mild associated soft tissue inflammation, suggestive of mild acute appendicitis.  Mild adjacent  cecal wall thickening is noted.  No significant free fluid is seen.  There is no evidence for perforation or abscess formation.  The colon is unremarkable in appearance.  The bladder is mildly distended and grossly unremarkable in appearance.  The uterus is within normal limits.  The ovaries are relatively symmetric; no suspicious adnexal masses are seen.  No inguinal lymphadenopathy is seen.  No acute osseous abnormalities are identified.  IMPRESSION:  1.  Mild dilatation of the appendix to 0.9 cm in diameter, with mild soft tissue inflammation and adjacent thickening of the cecal wall.  This most likely reflects mild appendicitis.  No evidence for perforation or abscess formation.  No free fluid seen. 2.  Cholelithiasis noted; gallbladder otherwise unremarkable in appearance. 3.  Mild bibasilar atelectasis noted.  These results were called by telephone on 01/18/2012  at  07:03 a.m. to  [facility] PA, who verbally acknowledged these results.  Original Report Authenticated By: Tonia Ghent, M.D.    Anti-infectives: Anti-infectives     Start     Dose/Rate Route Frequency Ordered Stop   01/18/12 0715   ertapenem (INVANZ) 1 g in sodium chloride 0.9 % 50 mL IVPB        1 g 100 mL/hr over 30 Minutes Intravenous  Once 01/18/12 0707 01/18/12 0816          Assessment/Plan: s/p Procedure(s) (LRB): APPENDECTOMY LAPAROSCOPIC (N/A) 1.Advance diet 2. D/C IVF 3.Possible discharge to home if tolerates regular diet without any NV. 4. Recheck CBC   LOS: 2 days    Kalep Full 01/19/2012

## 2012-01-20 MED ORDER — ONDANSETRON HCL 4 MG PO TABS: 4.0000 mg | ORAL_TABLET | Freq: Four times a day (QID) | ORAL | Status: AC | PRN

## 2012-01-20 MED ORDER — OXYCODONE-ACETAMINOPHEN 5-325 MG PO TABS
1.0000 | ORAL_TABLET | ORAL | Status: AC | PRN
Start: 2012-01-20 — End: 2012-01-30

## 2012-01-20 NOTE — Discharge Summary (Signed)
  Physician Discharge Summary  Patient ID: Adrionna Delcid MRN: [id number] DOB/AGE: 35/13/1977 35 y.o.  Admit date: 01/17/2012 Discharge date: 01/20/2012  Discharge Diagnoses There are no active problems to display for this patient.   Consultants none  Procedures Laparoscopic Appendectomy  HPI: 36 yr old female admitted through [facility] with abdominal pain and nausea who was found to have acute appendicitis.  Hospital Course: Patient was admitted and underwent the procedure listed above.  She tolerated this well but her post-op course was complicated by persistent nausea requiring an extra day in the hospital.  On POD#2, her nausea was much better, she was hungry, her vitals stable, afebrile, her pain controlled and she was felt stable for discharge.    Medication List  As of 01/20/2012  8:56 AM   TAKE these medications         EMETROL PO   Take 5 mLs by mouth 2 (two) times daily as needed. For nausea      ondansetron 4 MG tablet   Commonly known as: ZOFRAN   Take 1 tablet (4 mg total) by mouth every 6 (six) hours as needed for nausea.      oxyCODONE-acetaminophen 5-325 MG per tablet   Commonly known as: PERCOCET   Take 1-2 tablets by mouth every 4 (four) hours as needed.             Follow-up Information    Follow up with Currie Paris, MD. Call in 2 weeks. (Post-op appendectomy)    Contact information:   [facility] [address] [place] [address] [phone]          Signed: Clance Boll, Cordelia Poche   01/20/2012, 8:56 AM

## 2012-01-20 NOTE — Discharge Instructions (Signed)
Apendicitis (Appendicitis) Se llama apendicitis a la inflamacin del apndice. La inflamacin puede causar la ruptura (perforacin) y la acumulacin de pus. (absceso).  CAUSAS No siempre hay una causa evidente de apendicitis. A veces se produce por una obstruccin en el apndice. Las causas de la obstruccin pueden ser:  Verner Mould pequea y dura de materia fecal del tamao de una arveja (fecalito).   Ganglios linfticos agrandados en el apndice.  SINTOMAS  Dolor alrededor del ombligo que se irradia hacia la zona derecha del abdomen. El dolor puede ser cada vez ms intenso y agudo a medida que el tiempo pasa.   Sensibilidad en la zona inferior derecha del abdomen. El dolor empeora si tose o realiza algn movimiento brusco.   Ganas de vomitar (nuseas).   Vmitos.   Prdida del apetito.   Grant Ruts.   Constipacin.   Diarrea.   Malestar general.  DIAGNSTICO  Examen fsico.   anlisis de sangre.   Anlisis de [place].   Una radiografa o tomografa computada para confirmar el diagnstico.  TRATAMENTO Una vez que el diagnstico de apendicitis fue realizado, se procede a extirparlo lo antes posible. Este procedimiento se denomina apendicectoma. En una apendicectoma abierta, se realiza un corte (incisin) en la zona inferior derecha del abdomen y se extirpa el apndice. En una apendicectoma laporoscpica, por lo general se realizan 3 incisiones. Se utilizan instrumentos largos y finos y [place] para extirpar el apndice. La mayora de los pacientes vuelve a su casa luego de 24 a 28 horas. En algunas situaciones, el apndice podra estar perforado y se puede haber formado un absceso. El absceso podr [employer] "pared" alrededor que puede verse con una tomografa computarizada. En este caso, se realizar un drenaje del absceso y se lo tratar con medicamentos que matan grmenes (antibiticos). Una vez que el absceso se ha resuelto, podr ser o no necesario Runner, broadcasting/film/video. Document Released: 07/19/2005 Document Revised: 07/08/2011 [facility] Patient Information 2012 [place], [place].  Apendicectoma abierta - Cuidados posteriores  (Open Appendectomy, Care After)  Siga estas instrucciones durante las prximas semanas. Estas indicaciones le proporcionan informacin general acerca de cmo deber cuidarse despus del procedimiento. El mdico tambin podr darle instrucciones especficas. El tratamiento ha sido planificado segn las prcticas mdicas actuales, pero en algunos casos pueden ocurrir problemas. Comunquese con el mdico si tiene algn problema o tiene preguntas despus del procedimiento.  INSTRUCCIONES PARA EL CUIDADO EN EL HOGAR   No conduzca mientras toma analgsicos narcticos.   Use un laxante si est constipado por los analgsicos.   Cambie el vendaje tal como se le indic.   Mantenga la herida seca y limpia. Puede lavar la herida suavemente con Hortense Ramal. Seque dando palmaditas con una toalla limpia y seca.   No tome baos, no utilice piscinas ni baeras durante [address], o segn las indicaciones del mdico.   Solo tome medicamentos que se pueden comprar sin receta o recetados para Chief Technology Officer, Dentist o fiebre, como le indica el mdico.   Puede retomar su dieta habitual, segn las indicaciones.   No levante objetos que pesen ms de 10 libras (4.5 kg) ni practique deportes de contacto durante 3 semanas, o segn las indicaciones.   Retome lentamente la actividad despus de la [place].   Inspire profundamente para evitar una infeccin pulmonar (neumona).  SOLICITE ATENCIN MDICA SI:   Presenta enrojecimiento, hinchazn o aumento del dolor en la herida.   Observa pus que proviene de la herida.   Hay una secrecin  en la herida que dura ms de Civil engineer, contracting.   Advierte un olor ftido que proviene de la herida o del vendaje.   La herida se abre luego de que le han extrado las suturas.   Nota un incremento del dolor en los  hombros (en la zona donde van los breteles) o cerca de los omplatos.   Presenta episodios de mareos o se siente dbil cuando est de pie.   Le falta el aire.   Presenta nuseas o vmitos persistentes.   No puede controlar las funciones intestinales o pierde el apetito.   Tiene diarrea.  SOLICITE ATENCIN MDICA DE INMEDIATO SI:   Tiene fiebre.   Aparece una erupcin cutnea.   Presenta dificultades respiratorias o siente dolor en el pecho.   Aparece alguna reaccin o efecto secundario por los medicamentos administrados.  ASEGRESE DE QUE:   Comprende estas instrucciones.   Controlar su enfermedad.   Solicitar ayuda de inmediato si no mejora o si empeora.  Document Released: 03/17/2011 Document Revised: 07/08/2011 [facility] Patient Information 2012 [place], [place].Apendicectoma abierta - Cuidados posteriores  (Open Appendectomy, Care After)  Siga estas instrucciones durante las prximas semanas. Estas indicaciones le proporcionan informacin general acerca de cmo deber cuidarse despus del procedimiento. El mdico tambin podr darle instrucciones especficas. El tratamiento ha sido planificado segn las prcticas mdicas actuales, pero en algunos casos pueden ocurrir problemas. Comunquese con el mdico si tiene algn problema o tiene preguntas despus del procedimiento.  INSTRUCCIONES PARA EL CUIDADO EN EL HOGAR   No conduzca mientras toma analgsicos narcticos.   Use un laxante si est constipado por los analgsicos.   Cambie el vendaje tal como se le indic.   Mantenga la herida seca y limpia. Puede lavar la herida suavemente con Hortense Ramal. Seque dando palmaditas con una toalla limpia y seca.   No tome baos, no utilice piscinas ni baeras durante [address], o segn las indicaciones del mdico.   Solo tome medicamentos que se pueden comprar sin receta o recetados para Chief Technology Officer, Dentist o fiebre, como le indica el mdico.   Puede retomar su dieta habitual,  segn las indicaciones.   No levante objetos que pesen ms de 10 libras (4.5 kg) ni practique deportes de contacto durante 3 semanas, o segn las indicaciones.   Retome lentamente la actividad despus de la [place].   Inspire profundamente para evitar una infeccin pulmonar (neumona).  SOLICITE ATENCIN MDICA SI:   Presenta enrojecimiento, hinchazn o aumento del dolor en la herida.   Observa pus que proviene de la herida.   Hay una secrecin en la herida que dura ms de Civil engineer, contracting.   Advierte un olor ftido que proviene de la herida o del vendaje.   La herida se abre luego de que le han extrado las suturas.   Nota un incremento del dolor en los hombros (en la zona donde van los breteles) o cerca de los omplatos.   Presenta episodios de mareos o se siente dbil cuando est de pie.   Le falta el aire.   Presenta nuseas o vmitos persistentes.   No puede controlar las funciones intestinales o pierde el apetito.   Tiene diarrea.  SOLICITE ATENCIN MDICA DE INMEDIATO SI:   Tiene fiebre.   Aparece una erupcin cutnea.   Presenta dificultades respiratorias o siente dolor en el pecho.   Aparece alguna reaccin o efecto secundario por los medicamentos administrados.  ASEGRESE DE QUE:   Comprende estas instrucciones.   Controlar su enfermedad.  Solicitar ayuda de inmediato si no mejora o si empeora.  Document Released: 03/17/2011 Document Revised: 07/08/2011 [facility] Patient Information 2012 [place], [place].

## 2012-02-10 ENCOUNTER — Encounter (INDEPENDENT_AMBULATORY_CARE_PROVIDER_SITE_OTHER): Payer: Self-pay | Admitting: Surgery

## 2012-02-10 ENCOUNTER — Ambulatory Visit (INDEPENDENT_AMBULATORY_CARE_PROVIDER_SITE_OTHER): Payer: Self-pay | Admitting: Surgery

## 2012-02-10 VITALS — BP 122/80 | HR 68 | Temp 98.5°F | Resp 16 | Ht 61.5 in | Wt 146.0 lb

## 2012-02-10 DIAGNOSIS — K3580 Unspecified acute appendicitis: Secondary | ICD-10-CM

## 2012-02-10 NOTE — Patient Instructions (Addendum)
We will see you again on an as needed basis. Please call the office at [phone] if you have any questions or concerns. Thank you for allowing us to take care of you.  

## 2012-02-11 ENCOUNTER — Encounter (INDEPENDENT_AMBULATORY_CARE_PROVIDER_SITE_OTHER): Payer: Self-pay | Admitting: Surgery

## 2012-02-11 NOTE — Progress Notes (Signed)
NAME: Kristen Holt                                            DOB: 09/14/1975 DATE: 02/10/2012                                                  MRN: [id number]  CC: Post op   HPI: This patient comes in for post op follow-up.Sheunderwent lap appendectomy on 01/17/12. She feels that she is doing well. Occasional mild nausea. Has returned to work PE:  VITAL SIGNS: BP 122/80  Pulse 68  Temp 98.5 F (36.9 C) (Temporal)  Resp 16  Ht 5' 1.5" (1.562 m)  Wt 146 lb (66.225 kg)  BMI 27.14 kg/m2  LMP 01/14/2012  General: The patient appears to be healthy, NAD Abdomen soft and benign. All incisions healing fine  DATA REVIEWED: Path showed acute supporative appendicitis  IMPRESSION: The patient is doing well S/P appendectomy.    PLAN: She can resume all normal activities; RTC PRN

## 2012-09-28 ENCOUNTER — Encounter (HOSPITAL_COMMUNITY): Payer: Self-pay

## 2012-09-28 ENCOUNTER — Emergency Department (HOSPITAL_COMMUNITY)
Admission: EM | Admit: 2012-09-28 | Discharge: 2012-09-28 | Disposition: A | Discharge status: Home / Self Care | Payer: No Typology Code available for payment source | Source: Home / Self Care

## 2012-09-28 DIAGNOSIS — E039 Hypothyroidism, unspecified: Secondary | ICD-10-CM

## 2012-09-28 DIAGNOSIS — J329 Chronic sinusitis, unspecified: Secondary | ICD-10-CM

## 2012-09-28 MED ORDER — LORATADINE 10 MG PO TABS: 10.0000 mg | ORAL_TABLET | Freq: Every day | ORAL | Status: DC

## 2012-09-28 MED ORDER — AMOXICILLIN-POT CLAVULANATE 875-125 MG PO TABS: 1.0000 | ORAL_TABLET | Freq: Two times a day (BID) | ORAL | Status: DC

## 2012-09-28 NOTE — ED Provider Notes (Signed)
Patient Demographics  Kristen Holt, is a 37 y.o. female  ZOX:[id number]  WJX:[id number]  DOB - 09/14/1975  Chief Complaint  Patient presents with  . Thyroid Problem        Subjective:   Kristen Holt is a 37 yo with h/o hypothyroidism, ?perrenial allergies, and sinusitis here today for recheck of her TFTs and c/o sinus problems. She reports rhinorrhea, postnasal drip that she has had for more tan 2weeks, and pains above her eye areas bilat. today has, No chest pain, No abdominal pain - No Nausea, No new weakness tingling or numbness, No Cough - SOB. She denies fevers she states she has been on claritin, states she tried the other antihistamines in the past and they did not work.  Objective:    Filed Vitals:   09/28/12 1409  BP: 124/76  Pulse: 68  Temp: 97.7 F (36.5 C)  TempSrc: Oral  SpO2: 100%     Exam  Awake Alert, Oriented X 3, No new F.N deficits, Normal affect [place].AT,PERRAL, tenderness over frontal area above sinuses bil/ethmoid sinus areas Supple Neck,No JVD, No cervical lymphadenopathy appriciated.  Symmetrical Chest wall movement, Good air movement bilaterally, CTAB RRR,No Gallops,Rubs or new Murmurs, No Parasternal Heave +ve B.Sounds, Abd Soft, Non tender, No organomegaly appriciated, No rebound - guarding or rigidity. No Cyanosis, Clubbing or edema, No new Rash or bruise      Data Review   CBC No results found for this basename: WBC, HGB, HCT, PLT, MCV, MCH, MCHC, RDW, NEUTRABS, LYMPHSABS, MONOABS, EOSABS, BASOSABS, BANDABS, BANDSABD,  in the last 168 hours  Chemistries   No results found for this basename: NA, K, CL, CO2, GLUCOSE, BUN, CREATININE, GFRCGP, CALCIUM, MG, AST, ALT, ALKPHOS, BILITOT,  in the last 168 hours ------------------------------------------------------------------------------------------------------------------ No results found for this basename: HGBA1C,  in the last 72  hours ------------------------------------------------------------------------------------------------------------------ No results found for this basename: CHOL, HDL, LDLCALC, TRIG, CHOLHDL, LDLDIRECT,  in the last 72 hours ------------------------------------------------------------------------------------------------------------------ No results found for this basename: TSH, T4TOTAL, FREET3, T3FREE, THYROIDAB,  in the last 72 hours ------------------------------------------------------------------------------------------------------------------ No results found for this basename: VITAMINB12, FOLATE, FERRITIN, TIBC, IRON, RETICCTPCT,  in the last 72 hours  Coagulation profile  No results found for this basename: INR, PROTIME,  in the last 168 hours     Prior to Admission medications   Medication Sig Start Date End Date Taking? Authorizing Provider  amoxicillin-clavulanate (AUGMENTIN) 875-125 MG per tablet Take 1 tablet by mouth 2 (two) times daily. 09/28/12   Kela Millin, MD  loratadine (CLARITIN) 10 MG tablet Take 1 tablet (10 mg total) by mouth daily. 09/28/12   Kela Millin, MD     Assessment & Plan   Hypothyroidism - continue synthroid -check TSH, follow and adjust meds as appropriate Sinusitis -will treat with a10day course of augmentin -continue claritin -follow up as needed or in 3mos    Follow-up Information   Schedule an appointment as soon as possible for a visit in 3 months to follow up. (and as needed)        Rozena Fierro C M.D on 09/28/2012 at 3:19 PM   Kela Millin, MD 09/29/12 2130

## 2012-09-28 NOTE — ED Notes (Signed)
Patient states here for thyroid check  And sinus problem

## 2012-09-29 LAB — TSH: TSH: 1.813 u[IU]/mL (ref 0.350–4.500)

## 2012-10-26 ENCOUNTER — Encounter (HOSPITAL_COMMUNITY): Payer: Self-pay | Admitting: *Deleted

## 2012-10-26 ENCOUNTER — Emergency Department (HOSPITAL_COMMUNITY)
Admission: EM | Admit: 2012-10-26 | Discharge: 2012-10-26 | Disposition: A | Discharge status: Home / Self Care | Payer: No Typology Code available for payment source | Source: Home / Self Care

## 2012-10-26 DIAGNOSIS — J329 Chronic sinusitis, unspecified: Secondary | ICD-10-CM

## 2012-10-26 HISTORY — DX: Disorder of thyroid, unspecified: E07.9

## 2012-10-26 MED ORDER — PHENYLEPHRINE HCL 1 % NA SOLN: 1.0000 [drp] | NASAL | Status: DC | PRN

## 2012-10-26 MED ORDER — FLUCONAZOLE 100 MG PO TABS: 100.0000 mg | ORAL_TABLET | Freq: Every day | ORAL | Status: DC

## 2012-10-26 NOTE — ED Notes (Signed)
Pt here to establish care and need lab results from another time; still has sinusitis; vaginal itching.

## 2012-10-26 NOTE — ED Provider Notes (Signed)
History     CSN: [id number]  Arrival date & time 10/26/12  1614   First MD Initiated Contact with Patient 10/26/12 1640      Chief Complaint  Patient presents with  . Establish Care    (Consider location/radiation/quality/duration/timing/severity/associated sxs/prior treatment) HPI Pt is 37 yo female with recent sinusitis, comes in for follow up and explains she has completed ABX but feels as if she is still congested in nasal area. She denies fevers, chills, cough, chest pain or shortness of breath. No other systemic concerns.  Past Medical History  Diagnosis Date  . Diabetes mellitus     pre diabetic   . Acute appendicitis 01/13/2012  . Thyroid disease     Past Surgical History  Procedure Laterality Date  . Laparoscopic appendectomy  01/18/2012    Procedure: APPENDECTOMY LAPAROSCOPIC;  Surgeon: Currie Paris, MD;  Location: [facility] OR;  Service: General;  Laterality: N/A;  . Appendectomy      Family History  Problem Relation Age of Onset  . Diabetes Mother     History  Substance Use Topics  . Smoking status: Never Smoker   . Smokeless tobacco: Never Used  . Alcohol Use: No    OB History   Grav Para Term Preterm Abortions TAB SAB Ect Mult Living                  Review of Systems  Constitutional: Negative for fever, chills, diaphoresis, activity change, appetite change and fatigue.  HENT: Negative for ear pain, nosebleeds, facial swelling, neck pain, neck stiffness and ear discharge.   Eyes: Negative for pain, discharge, redness, itching and visual disturbance.  Respiratory: Negative for cough, choking, chest tightness, shortness of breath, wheezing and stridor.   Cardiovascular: Negative for chest pain, palpitations and leg swelling.  Gastrointestinal: Negative for abdominal distention.  Genitourinary: Negative for dysuria, urgency, frequency, hematuria, flank pain, decreased urine volume, difficulty urinating and dyspareunia.  Musculoskeletal: Negative for  back pain, joint swelling, arthralgias and gait problem.  Neurological: Negative for dizziness, tremors, seizures, syncope, facial asymmetry, speech difficulty, weakness, light-headedness, numbness and headaches.  Hematological: Negative for adenopathy. Does not bruise/bleed easily.  Psychiatric/Behavioral: Negative for hallucinations, behavioral problems, confusion, dysphoric mood, decreased concentration and agitation.    Allergies  Review of patient's allergies indicates no known allergies.  Home Medications   Current Outpatient Rx  Name  Route  Sig  Dispense  Refill  . levothyroxine (SYNTHROID, LEVOTHROID) 25 MCG tablet   Oral   Take 25 mcg by mouth daily.         . fluconazole (DIFLUCAN) 100 MG tablet   Oral   Take 1 tablet (100 mg total) by mouth daily.   10 tablet   0   . phenylephrine ([employer] NASAL SPRAY) 1 % nasal spray   Nasal   Place 1 drop into the nose every 4 (four) hours as needed for congestion.   30 mL   1     BP 91/62  Pulse 75  Temp(Src) 97.8 F (36.6 C) (Oral)  SpO2 100%  LMP 10/04/2012  Physical Exam  Constitutional: Appears well-developed and well-nourished. No distress.  HENT: Normocephalic. External right and left ear normal. Oropharynx is clear and moist.  Eyes: Conjunctivae and EOM are normal. PERRLA, no scleral icterus.  Neck: Normal ROM. Neck supple. No JVD. No tracheal deviation. No thyromegaly.  CVS: RRR, S1/S2 +, no murmurs, no gallops, no carotid bruit.  Pulmonary: Effort and breath sounds normal, no stridor, rhonchi,  wheezes, rales.  Abdominal: Soft. BS +,  no distension, tenderness, rebound or guarding.  Musculoskeletal: Normal range of motion. No edema and no tenderness.  Lymphadenopathy: No lymphadenopathy noted, cervical, inguinal. Neuro: Alert. Normal reflexes, muscle tone coordination. No cranial nerve deficit. Skin: Skin is warm and dry. No rash noted. Not diaphoretic. No erythema. No pallor.  Psychiatric: Normal mood and  affect. Behavior, judgment, thought content normal.    ED Course  Procedures (including critical care time)  Labs Reviewed - No data to display No results found.   1. Sinusitis   - encouraged placing warm compresses if sinuses feel tender - discussed overuse of ABX as pt is afebrile and has completed course of ABX  - advised inhaling warm salty steam to alleviate congestion, advised avoiding sick contacts or exposures    MDM  Sinusitis

## 2013-01-19 ENCOUNTER — Ambulatory Visit: Payer: No Typology Code available for payment source | Attending: Family Medicine | Admitting: Internal Medicine

## 2013-01-19 VITALS — BP 108/73 | HR 80 | Temp 98.2°F | Resp 16 | Wt 150.4 lb

## 2013-01-19 DIAGNOSIS — Z79899 Other long term (current) drug therapy: Secondary | ICD-10-CM | POA: Insufficient documentation

## 2013-01-19 DIAGNOSIS — J020 Streptococcal pharyngitis: Secondary | ICD-10-CM

## 2013-01-19 DIAGNOSIS — J029 Acute pharyngitis, unspecified: Secondary | ICD-10-CM | POA: Insufficient documentation

## 2013-01-19 DIAGNOSIS — J329 Chronic sinusitis, unspecified: Secondary | ICD-10-CM | POA: Insufficient documentation

## 2013-01-19 DIAGNOSIS — E119 Type 2 diabetes mellitus without complications: Secondary | ICD-10-CM | POA: Insufficient documentation

## 2013-01-19 DIAGNOSIS — R1310 Dysphagia, unspecified: Secondary | ICD-10-CM | POA: Insufficient documentation

## 2013-01-19 LAB — CBC WITH DIFFERENTIAL/PLATELET
Basophils Absolute: 0 10*3/uL (ref 0.0–0.1)
Eosinophils Absolute: 0.7 10*3/uL (ref 0.0–0.7)
Eosinophils Relative: 10 % — ABNORMAL HIGH (ref 0–5)
MCH: 28.9 pg (ref 26.0–34.0)
MCV: 87 fL (ref 78.0–100.0)
Platelets: 308 10*3/uL (ref 150–400)
RDW: 13.8 % (ref 11.5–15.5)

## 2013-01-19 MED ORDER — BENZOCAINE-MENTHOL 10-2.1 MG MT LOZG: LOZENGE | OROMUCOSAL | Status: DC

## 2013-01-19 MED ORDER — FLUTICASONE PROPIONATE 50 MCG/ACT NA SUSP: 2.0000 | Freq: Every day | NASAL | Status: DC

## 2013-01-19 MED ORDER — AMOXICILLIN-POT CLAVULANATE 875-125 MG PO TABS: 1.0000 | ORAL_TABLET | Freq: Two times a day (BID) | ORAL | Status: AC

## 2013-01-19 NOTE — Progress Notes (Signed)
Patient ID: Kristen Holt, female   DOB: 09/14/1975, 37 y.o.   MRN: [id number]   CC:  HPI: 37 year old female who [facility] sore throat. The symptoms have been present for about a couple of weeks. She has difficulty swallowing she has more pain on the left tonsil. She states that occasionally she has noted white spots on her palate. The patient denies any fever, denies any cough. The patient now has a history of chronic sinusitis and has been taking loratadine for this. But she states that it does not work very well.   No Known Allergies Past Medical History  Diagnosis Date  . Diabetes mellitus     pre diabetic   . Acute appendicitis 01/13/2012  . Thyroid disease    Current Outpatient Prescriptions on File Prior to Visit  Medication Sig Dispense Refill  . fluconazole (DIFLUCAN) 100 MG tablet Take 1 tablet (100 mg total) by mouth daily.  10 tablet  0  . levothyroxine (SYNTHROID, LEVOTHROID) 25 MCG tablet Take 25 mcg by mouth daily.      . phenylephrine ([employer]S NASAL SPRAY) 1 % nasal spray Place 1 drop into the nose every 4 (four) hours as needed for congestion.  30 mL  1   No current facility-administered medications on file prior to visit.   Family History  Problem Relation Age of Onset  . Diabetes Mother    History   Social History  . Marital Status: Single    Spouse Name: N/A    Number of Children: N/A  . Years of Education: N/A   Occupational History  . Not on file.   Social History Main Topics  . Smoking status: Never Smoker   . Smokeless tobacco: Never Used  . Alcohol Use: No  . Drug Use: No  . Sexually Active: Yes   Other Topics Concern  . Not on file   Social History Narrative  . No narrative on file    Review of Systems  Constitutional: Negative for fever, chills, diaphoresis, activity change, appetite change and fatigue.  HENT: Negative for ear pain, nosebleeds, congestion, facial swelling, rhinorrhea,  neck pain, neck stiffness and ear discharge.   Eyes: Negative for pain, discharge, redness, itching and visual disturbance.  Respiratory: Negative for cough, choking, chest tightness, shortness of breath, wheezing and stridor.   Cardiovascular: Negative for chest pain, palpitations and leg swelling.  Gastrointestinal: Negative for abdominal distention.  Genitourinary: Negative for dysuria, urgency, frequency, hematuria, flank pain, decreased urine volume, difficulty urinating and dyspareunia.  Musculoskeletal: Negative for back pain, joint swelling, arthralgias and gait problem.  Neurological: Negative for dizziness, tremors, seizures, syncope, facial asymmetry, speech difficulty, weakness, light-headedness, numbness and headaches.  Hematological: Negative for adenopathy. Does not bruise/bleed easily.  Psychiatric/Behavioral: Negative for hallucinations, behavioral problems, confusion, dysphoric mood, decreased concentration and agitation.    Objective:   Filed Vitals:   01/19/13 0911  BP: 108/73  Pulse: 80  Temp: 98.2 F (36.8 C)  Resp: 16    Physical Exam  Constitutional: Appears well-developed and well-nourished. No distress.  HENT: Normocephalic. External right and left ear normal. Oropharynx is clear and moist.  Eyes: Conjunctivae and EOM are normal. PERRLA, no scleral icterus.  Neck: Normal ROM. Neck supple. No JVD. No tracheal deviation. No thyromegaly.  CVS: RRR, S1/S2 +, no murmurs, no gallops, no carotid bruit.  Pulmonary: Effort and breath sounds normal, no stridor, rhonchi, wheezes, rales.  Abdominal: Soft. BS +,  no distension, tenderness, rebound  or guarding.  Musculoskeletal: Normal range of motion. No edema and no tenderness.  Lymphadenopathy: No lymphadenopathy noted, cervical, inguinal. Neuro: Alert. Normal reflexes, muscle tone coordination. No cranial nerve deficit. Skin: Skin is warm and dry. No rash noted. Not diaphoretic. No erythema. No pallor.  Psychiatric:  Normal mood and affect. Behavior, judgment, thought content normal.   Lab Results  Component Value Date   WBC 7.5 01/19/2012   HGB 12.1 01/19/2012   HCT 36.5 01/19/2012   MCV 89.9 01/19/2012   PLT 291 01/19/2012   Lab Results  Component Value Date   CREATININE 0.57 01/17/2012   BUN 12 01/17/2012   NA 137 01/17/2012   K 3.9 01/17/2012   CL 101 01/17/2012   CO2 25 01/17/2012    No results found for this basename: HGBA1C   Lipid Panel  No results found for this basename: chol, trig, hdl, cholhdl, vldl, ldlcalc       Assessment and plan:   Patient Active Problem List   Diagnosis Date Noted  . Acute appendicitis 01/13/2012   Acute laryngitis vs streptococcal pharyngitis #1 Will obtain a rapid strep throat test #2 prescribe Augmentin for 10 days #3 prescribed Flonase for congestion  Patient advised to followup and call back within 2 weeks if symptoms don't resolve

## 2013-01-19 NOTE — Progress Notes (Signed)
Patient still not feeling well Complains of sore throat with white stuff on tonsils Congestion Runny nose

## 2013-02-01 ENCOUNTER — Ambulatory Visit: Payer: No Typology Code available for payment source | Attending: Family Medicine | Admitting: Family Medicine

## 2013-02-01 VITALS — BP 109/75 | HR 69 | Temp 97.9°F | Resp 17 | Ht 65.0 in | Wt 149.8 lb

## 2013-02-01 DIAGNOSIS — J029 Acute pharyngitis, unspecified: Secondary | ICD-10-CM | POA: Insufficient documentation

## 2013-02-01 DIAGNOSIS — J309 Allergic rhinitis, unspecified: Secondary | ICD-10-CM | POA: Insufficient documentation

## 2013-02-01 DIAGNOSIS — J020 Streptococcal pharyngitis: Secondary | ICD-10-CM | POA: Insufficient documentation

## 2013-02-01 MED ORDER — NAPROXEN 500 MG PO TABS: ORAL_TABLET | ORAL | Status: DC

## 2013-02-01 NOTE — Patient Instructions (Addendum)
Faringitis Viral  (Viral Pharyngitis)   La faringitis virales una infección viral que produce enrojecimiento, dolor e hinchazón (inflamación) en la garganta. No se disemina de una persona a otra (no es contagiosa).   CAUSAS  La causa es la inhalación de una gran cantidad de gérmenes llamados virus. Muchos virus diferentes pueden causar faringitis viral.  SÍNTOMAS  Los síntomas de faringitis viral son:  · Dolor de garganta  · Cansancio.  · Nariz tapada.  · Fiebre no muy elevada  · Congestión  · Tos  TRATAMIENTO  El tratamiento incluye reposo, beber muchos líquidos y el uso de medicamentos de venta libre (autorizados por el médico)  INSTRUCCIONES PARA EL CUIDADO EN EL HOGAR   · Debe ingerir gran cantidad de líquido para mantener la orina de tono claro o color amarillo pálido.  · Consuma alimentos blandos, fríos, como helados de crema, de agua o gelatina.  · Puede hacer gárgaras con agua tibia con sal (una cucharadita en 1 litro de agua).  · Después de los 7 años, pueden administrarse pastillas para la tos con seguridad.  · Solo tome medicamentos que se pueden comprar sin receta o recetados para el dolor, malestar o fiebre, como le indica el médico. No tome aspirina  Para no contagiar evite:  · El contacto boca a boca con otras personas.  · Compartir utensilios para comer o beber.  · Toser cerca de otras personas  SOLICITE ATENCIÓN MÉDICA SI:   · Mejora luego de algunos días pero luego empeora.  · Tiene fiebre o siente un dolor intenso que no puede ser controlado con los medicamentos.  · Hay otros cambios que lo preocupan.  Document Released: 04/28/2005 Document Revised: 10/11/2011  [employer]® Patient Information ©2014 [employer].

## 2013-02-01 NOTE — Progress Notes (Signed)
Patient states here for follow up for her sore throat Still not better-now has white stuff in her throat

## 2013-02-01 NOTE — Progress Notes (Signed)
Patient ID: Kristen Holt, female   DOB: 09/14/1975, 37 y.o.   MRN: [id number]  [id number] throat  Translator used to communicate with patient   HPI: Pt reports that she has some white patches on the back of her throat and reports that she still has some sore throat.  No fever or chills.  Pt says she completed the antibiotics with no problems.    No Known Allergies Past Medical History  Diagnosis Date  . Diabetes mellitus     pre diabetic   . Acute appendicitis 01/13/2012  . Thyroid disease    Current Outpatient Prescriptions on File Prior to Visit  Medication Sig Dispense Refill  . Benzocaine-Menthol (CEPACOL SORE THROAT) 10-2.1 MG LOZG Use of every 4 hours as needed  100 each  0  . fluticasone (FLONASE) 50 MCG/ACT nasal spray Place 2 sprays into the nose daily.  16 g  6  . levothyroxine (SYNTHROID, LEVOTHROID) 25 MCG tablet Take 25 mcg by mouth daily.       No current facility-administered medications on file prior to visit.   Family History  Problem Relation Age of Onset  . Diabetes Mother    History   Social History  . Marital Status: Single    Spouse Name: N/A    Number of Children: N/A  . Years of Education: N/A   Occupational History  . Not on file.   Social History Main Topics  . Smoking status: Never Smoker   . Smokeless tobacco: Never Used  . Alcohol Use: No  . Drug Use: No  . Sexually Active: Yes   Other Topics Concern  . Not on file   Social History Narrative  . No narrative on file    Review of Systems  Constitutional: Negative for fever, chills, diaphoresis, activity change, appetite change and fatigue.  HENT: Negative for ear pain, nosebleeds, congestion, facial swelling, rhinorrhea, neck pain, neck stiffness and ear discharge.   Eyes: Negative for pain, discharge, redness, itching and visual disturbance.  Respiratory: Negative for cough, choking, chest tightness, shortness of breath, wheezing and stridor.   Cardiovascular: Negative for  chest pain, palpitations and leg swelling.  Gastrointestinal: Negative for abdominal distention.  Genitourinary: Negative for dysuria, urgency, frequency, hematuria, flank pain, decreased urine volume, difficulty urinating and dyspareunia.  Musculoskeletal: Negative for back pain, joint swelling, arthralgias and gait problem.  Neurological: Negative for dizziness, tremors, seizures, syncope, facial asymmetry, speech difficulty, weakness, light-headedness, numbness and headaches.  Hematological: Negative for adenopathy. Does not bruise/bleed easily.  Psychiatric/Behavioral: Negative for hallucinations, behavioral problems, confusion, dysphoric mood, decreased concentration and agitation.    Objective:   Filed Vitals:   02/01/13 1040  BP: 109/75  Pulse: 69  Temp: 97.9 F (36.6 C)  Resp: 17    Physical Exam  Constitutional: Appears well-developed and well-nourished. No distress.  HENT: Normocephalic. External right and left ear normal. Oropharynx is clear and moist.  Eyes: Conjunctivae and EOM are normal. PERRLA, no scleral icterus.  Neck: Normal ROM. Neck supple. No JVD. No tracheal deviation. No thyromegaly.  CVS: RRR, S1/S2 +, no murmurs, no gallops, no carotid bruit.  Pulmonary: Effort and breath sounds normal, no stridor, rhonchi, wheezes, rales.  Abdominal: Soft. BS +,  no distension, tenderness, rebound or guarding.  Musculoskeletal: Normal range of motion. No edema and no tenderness.  Lymphadenopathy: No lymphadenopathy noted, cervical, inguinal. Neuro: Alert. Normal reflexes, muscle tone coordination. No cranial nerve deficit. Skin: Skin is warm and dry. No rash noted. Not diaphoretic. No erythema.  No pallor.  Psychiatric: Normal mood and affect. Behavior, judgment, thought content normal.   Lab Results  Component Value Date   WBC 7.2 01/19/2013   HGB 12.9 01/19/2013   HCT 38.9 01/19/2013   MCV 87.0 01/19/2013   PLT 308 01/19/2013   Lab Results  Component Value Date    CREATININE 0.57 01/17/2012   BUN 12 01/17/2012   NA 137 01/17/2012   K 3.9 01/17/2012   CL 101 01/17/2012   CO2 25 01/17/2012    No results found for this basename: HGBA1C   Lipid Panel  No results found for this basename: chol, trig, hdl, cholhdl, vldl, ldlcalc       Assessment and plan:   Patient Active Problem List   Diagnosis Date Noted  . Acute appendicitis 01/13/2012    Rapid Strep negative   Viral Pharyngitis  Naproxen 500 mg po every 12 hours with food prn Fluids Rest  RTC in 3 mos and prn  The patient was given clear instructions to go to ER or return to medical center if symptoms don't improve, worsen or new problems develop.  The patient verbalized understanding.  The patient was told to call to get any lab results if not heard anything in the next week.    Rodney Langton, MD, CDE, FAAFP [facility] [place], [place]

## 2013-02-23 ENCOUNTER — Emergency Department (HOSPITAL_COMMUNITY)
Admission: EM | Admit: 2013-02-23 | Discharge: 2013-02-23 | Disposition: A | Discharge status: Home / Self Care | Payer: No Typology Code available for payment source | Source: Home / Self Care | Attending: Emergency Medicine | Admitting: Emergency Medicine

## 2013-02-23 ENCOUNTER — Encounter (HOSPITAL_COMMUNITY): Payer: Self-pay | Admitting: Emergency Medicine

## 2013-02-23 ENCOUNTER — Emergency Department (INDEPENDENT_AMBULATORY_CARE_PROVIDER_SITE_OTHER): Payer: No Typology Code available for payment source

## 2013-02-23 DIAGNOSIS — K2970 Gastritis, unspecified, without bleeding: Secondary | ICD-10-CM

## 2013-02-23 DIAGNOSIS — K2990 Gastroduodenitis, unspecified, without bleeding: Secondary | ICD-10-CM

## 2013-02-23 LAB — CBC WITH DIFFERENTIAL/PLATELET
Eosinophils Relative: 7 % — ABNORMAL HIGH (ref 0–5)
HCT: 39.4 % (ref 36.0–46.0)
Lymphocytes Relative: 42 % (ref 12–46)
Lymphs Abs: 2.9 10*3/uL (ref 0.7–4.0)
MCH: 29.4 pg (ref 26.0–34.0)
MCHC: 34.3 g/dL (ref 30.0–36.0)
MCV: 85.8 fL (ref 78.0–100.0)
Monocytes Absolute: 0.6 10*3/uL (ref 0.1–1.0)
Neutro Abs: 2.8 10*3/uL (ref 1.7–7.7)
Neutrophils Relative %: 42 % — ABNORMAL LOW (ref 43–77)
Platelets: 292 10*3/uL (ref 150–400)
RBC: 4.59 MIL/uL (ref 3.87–5.11)
RDW: 13.5 % (ref 11.5–15.5)
WBC: 6.7 10*3/uL (ref 4.0–10.5)

## 2013-02-23 LAB — POCT I-STAT, CHEM 8
Potassium: 3.8 mEq/L (ref 3.5–5.1)
Sodium: 140 mEq/L (ref 135–145)
TCO2: 24 mmol/L (ref 0–100)

## 2013-02-23 LAB — POCT URINALYSIS DIP (DEVICE)
Leukocytes, UA: NEGATIVE
pH: 8.5 — ABNORMAL HIGH (ref 5.0–8.0)

## 2013-02-23 MED ORDER — ONDANSETRON HCL 4 MG/2ML IJ SOLN
4.0000 mg | Freq: Once | INTRAMUSCULAR | Status: AC
  Administered 2013-02-23: 4 mg via INTRAMUSCULAR

## 2013-02-23 MED ORDER — GI COCKTAIL ~~LOC~~
ORAL | Status: AC
  Filled 2013-02-23: qty 30

## 2013-02-23 MED ORDER — ONDANSETRON 4 MG PO TBDP
ORAL_TABLET | ORAL | Status: AC
  Filled 2013-02-23: qty 2

## 2013-02-23 MED ORDER — GI COCKTAIL ~~LOC~~
30.0000 mL | Freq: Once | ORAL | Status: AC
  Administered 2013-02-23: 30 mL via ORAL

## 2013-02-23 MED ORDER — ONDANSETRON 4 MG PO TBDP
8.0000 mg | ORAL_TABLET | Freq: Once | ORAL | Status: AC
  Administered 2013-02-23: 8 mg via ORAL

## 2013-02-23 MED ORDER — OXYCODONE-ACETAMINOPHEN 5-325 MG PO TABS: ORAL_TABLET | ORAL | Status: DC

## 2013-02-23 MED ORDER — ONDANSETRON 8 MG PO TBDP: 8.0000 mg | ORAL_TABLET | Freq: Three times a day (TID) | ORAL | Status: DC | PRN

## 2013-02-23 MED ORDER — DIPHENOXYLATE-ATROPINE 2.5-0.025 MG PO TABS: 1.0000 | ORAL_TABLET | Freq: Four times a day (QID) | ORAL | Status: DC | PRN

## 2013-02-23 MED ORDER — ONDANSETRON HCL 4 MG/2ML IJ SOLN
INTRAMUSCULAR | Status: AC
  Filled 2013-02-23: qty 2

## 2013-02-23 MED ORDER — OMEPRAZOLE 20 MG PO CPDR: 20.0000 mg | DELAYED_RELEASE_CAPSULE | Freq: Every day | ORAL | Status: DC

## 2013-02-23 NOTE — ED Notes (Signed)
Reports c/o abdominal pain, pointing to center, epigastric area.  0. Patient reports pain worse with eating and reports nausea and belching frequently.  Patient reports diarrhea this am, cold and hot spells.

## 2013-02-23 NOTE — ED Provider Notes (Signed)
Chief Complaint:   Chief Complaint  Patient presents with  . Abdominal Pain    History of Present Illness:    Kristen Holt is a 37 year old female who has a one-week history of epigastric pain which is worse when she is some better with antacids. It is rated 8/10 in intensity. It does not radiate. It's been associated with nausea, burping, and anorexia. She had diarrhea twice this morning and once while here at the urgent care Center. The patient has felt cold and hot but not had any definite fever. She's had some urinary frequency. She denies any vomiting, fever, URI symptoms, blood in the stool, black stools, dysuria, urgency, and hematuria, or GYN complaints. Her last menstrual period was July 18.  Review of Systems:  Other than noted above, the patient denies any of the following symptoms: Constitutional:  No fever, chills, fatigue, weight loss or anorexia. Lungs:  No cough or shortness of breath. Heart:  No chest pain, palpitations, syncope or edema.  No cardiac history. Abdomen:  No nausea, vomiting, hematememesis, melena, diarrhea, or hematochezia. GU:  No dysuria, frequency, urgency, or hematuria. Gyn:  No vaginal discharge, itching, abnormal bleeding, dyspareunia, or pelvic pain.  PMFSH:  Past medical history, family history, social history, meds, and allergies were reviewed along with nurse's notes.  No prior abdominal surgeries, past history of GI problems, STDs or GYN problems.  No history of aspirin or NSAID use.  No excessive alcohol intake.   Physical Exam:   Vital signs:  BP 126/77  Pulse 90  Temp(Src) 98 F (36.7 C) (Oral)  Resp 14  SpO2 97% Gen:  Alert, oriented, in no distress. Lungs:  Breath sounds clear and equal bilaterally.  No wheezes, rales or rhonchi. Heart:  Regular rhythm.  No gallops or murmers.   Abdomen:  Soft, flat, nondistended. No organomegaly or mass. Bowel sounds were normally active. She has tenderness to palpation in the epigastrium and over the  periumbilical area but not in the lower abdomen. There was no guarding or rebound. Skin:  Clear, warm and dry.  No rash.  Labs:   Results for orders placed during the hospital encounter of 02/23/13  CBC WITH DIFFERENTIAL      Result Value Range   WBC 6.7  4.0 - 10.5 K/uL   RBC 4.59  3.87 - 5.11 MIL/uL   Hemoglobin 13.5  12.0 - 15.0 g/dL   HCT 16.1  09.6 - 04.5 %   MCV 85.8  78.0 - 100.0 fL   MCH 29.4  26.0 - 34.0 pg   MCHC 34.3  30.0 - 36.0 g/dL   RDW 40.9  81.1 - 91.4 %   Platelets 292  150 - 400 K/uL   Neutrophils Relative % 42 (*) 43 - 77 %   Neutro Abs 2.8  1.7 - 7.7 K/uL   Lymphocytes Relative 42  12 - 46 %   Lymphs Abs 2.9  0.7 - 4.0 K/uL   Monocytes Relative 8  3 - 12 %   Monocytes Absolute 0.6  0.1 - 1.0 K/uL   Eosinophils Relative 7 (*) 0 - 5 %   Eosinophils Absolute 0.5  0.0 - 0.7 K/uL   Basophils Relative 1  0 - 1 %   Basophils Absolute 0.1  0.0 - 0.1 K/uL  POCT URINALYSIS DIP (DEVICE)      Result Value Range   Glucose, UA NEGATIVE  NEGATIVE mg/dL   Bilirubin Urine NEGATIVE  NEGATIVE   Ketones, ur NEGATIVE  NEGATIVE mg/dL   Specific Gravity, Urine 1.015  1.005 - 1.030   Hgb urine dipstick NEGATIVE  NEGATIVE   pH 8.5 (*) 5.0 - 8.0   Protein, ur NEGATIVE  NEGATIVE mg/dL   Urobilinogen, UA 0.2  0.0 - 1.0 mg/dL   Nitrite NEGATIVE  NEGATIVE   Leukocytes, UA NEGATIVE  NEGATIVE  POCT I-STAT, CHEM 8      Result Value Range   Sodium 140  135 - 145 mEq/L   Potassium 3.8  3.5 - 5.1 mEq/L   Chloride 105  96 - 112 mEq/L   BUN 9  6 - 23 mg/dL   Creatinine, Ser 1.61  0.50 - 1.10 mg/dL   Glucose, Bld 99  70 - 99 mg/dL   Calcium, Ion 0.96  0.45 - 1.23 mmol/L   TCO2 24  0 - 100 mmol/L   Hemoglobin 15.0  12.0 - 15.0 g/dL   HCT 40.9  81.1 - 91.4 %  POCT H PYLORI SCREEN      Result Value Range   H. PYLORI SCREEN, POC NEGATIVE  NEGATIVE  POCT PREGNANCY, URINE      Result Value Range   Preg Test, Ur NEGATIVE  NEGATIVE     Radiology:  Dg Abd Acute W/chest  02/23/2013    *RADIOLOGY REPORT*  Clinical Data: Epigastric pain for 2 weeks.  ACUTE ABDOMEN SERIES (ABDOMEN 2 VIEW & CHEST 1 VIEW)  Comparison: None.  Findings: The heart size and mediastinal contours are normal. The lungs are clear. There is no pleural effusion or pneumothorax. No acute osseous findings are identified.  The bowel gas pattern is normal.  There is no free intraperitoneal air.  Small pelvic calcifications are likely phleboliths.  The osseous structures appear normal.  IMPRESSION: No active cardiopulmonary or abdominal process identified.   Original Report Authenticated By: Carey Bullocks, M.D.    Course in Urgent Care Center:   She was given Zofran ODT 8 mg sublingually and GI cocktail. Her pain was better but she does little bit more nauseated so she was given additional 4 mg of Zofran IM.  Assessment:  The encounter diagnosis was Gastritis.  This could be a viral or bacterial gastroenteritis or could be gastritis or ulcer disease.  Plan:   1.  The following meds were prescribed:   Discharge Medication List as of 02/23/2013  3:51 PM    START taking these medications   Details  diphenoxylate-atropine (LOMOTIL) 2.5-0.025 MG per tablet Take 1 tablet by mouth 4 (four) times daily as needed for diarrhea or loose stools., Starting 02/23/2013, Until Discontinued, Print    omeprazole (PRILOSEC) 20 MG capsule Take 1 capsule (20 mg total) by mouth daily., Starting 02/23/2013, Until Discontinued, Normal    ondansetron (ZOFRAN ODT) 8 MG disintegrating tablet Take 1 tablet (8 mg total) by mouth every 8 (eight) hours as needed for nausea., Starting 02/23/2013, Until Discontinued, Normal    oxyCODONE-acetaminophen (PERCOCET) 5-325 MG per tablet 1 to 2 tablets every 6 hours as needed for pain., Print       2.  The patient was instructed in symptomatic care and handouts were given. 3.  The patient was told to return if becoming worse in any way, if no better in 3 or 4 days, and given some red flag symptoms  such as persistent vomiting or fever or worsening pain that would indicate earlier return. 4.  Follow up at the [facility] and [facility] next week.    Reuben Likes, MD  02/23/13 2148 

## 2013-02-23 NOTE — ED Notes (Signed)
Sipping ginger ale 

## 2013-02-23 NOTE — ED Notes (Signed)
Escorted to registration to have questions related to orange card answered

## 2013-02-23 NOTE — ED Notes (Signed)
MD at bedside. 

## 2013-05-21 ENCOUNTER — Ambulatory Visit: Payer: Medicaid Other

## 2013-05-21 ENCOUNTER — Ambulatory Visit: Payer: No Typology Code available for payment source | Attending: Internal Medicine | Admitting: Internal Medicine

## 2013-05-21 ENCOUNTER — Encounter: Payer: Self-pay | Admitting: Internal Medicine

## 2013-05-21 VITALS — BP 101/65 | HR 73 | Temp 99.4°F | Resp 16 | Ht 62.0 in | Wt 142.0 lb

## 2013-05-21 DIAGNOSIS — J029 Acute pharyngitis, unspecified: Secondary | ICD-10-CM | POA: Insufficient documentation

## 2013-05-21 DIAGNOSIS — R52 Pain, unspecified: Secondary | ICD-10-CM | POA: Insufficient documentation

## 2013-05-21 DIAGNOSIS — J3489 Other specified disorders of nose and nasal sinuses: Secondary | ICD-10-CM | POA: Insufficient documentation

## 2013-05-21 DIAGNOSIS — Z79899 Other long term (current) drug therapy: Secondary | ICD-10-CM | POA: Insufficient documentation

## 2013-05-21 DIAGNOSIS — Z23 Encounter for immunization: Secondary | ICD-10-CM | POA: Insufficient documentation

## 2013-05-21 DIAGNOSIS — R059 Cough, unspecified: Secondary | ICD-10-CM | POA: Insufficient documentation

## 2013-05-21 DIAGNOSIS — J069 Acute upper respiratory infection, unspecified: Secondary | ICD-10-CM | POA: Insufficient documentation

## 2013-05-21 DIAGNOSIS — R7309 Other abnormal glucose: Secondary | ICD-10-CM | POA: Insufficient documentation

## 2013-05-21 DIAGNOSIS — R509 Fever, unspecified: Secondary | ICD-10-CM | POA: Insufficient documentation

## 2013-05-21 DIAGNOSIS — R05 Cough: Secondary | ICD-10-CM | POA: Insufficient documentation

## 2013-05-21 DIAGNOSIS — E039 Hypothyroidism, unspecified: Secondary | ICD-10-CM | POA: Insufficient documentation

## 2013-05-21 LAB — CBC WITH DIFFERENTIAL/PLATELET
Basophils Absolute: 0 10*3/uL (ref 0.0–0.1)
Eosinophils Relative: 8 % — ABNORMAL HIGH (ref 0–5)
HCT: 37.2 % (ref 36.0–46.0)
Hemoglobin: 12.5 g/dL (ref 12.0–15.0)
Lymphocytes Relative: 35 % (ref 12–46)
MCHC: 33.6 g/dL (ref 30.0–36.0)
MCV: 86.3 fL (ref 78.0–100.0)
Monocytes Absolute: 0.5 10*3/uL (ref 0.1–1.0)
Monocytes Relative: 7 % (ref 3–12)
RDW: 14.1 % (ref 11.5–15.5)
WBC: 7.1 10*3/uL (ref 4.0–10.5)

## 2013-05-21 MED ORDER — FLUTICASONE PROPIONATE 50 MCG/ACT NA SUSP: 2.0000 | Freq: Every day | NASAL | Status: DC

## 2013-05-21 MED ORDER — AZITHROMYCIN 250 MG PO TABS: ORAL_TABLET | ORAL | Status: DC

## 2013-05-21 NOTE — Patient Instructions (Signed)
Infección de las vías aéreas superiores en los adultos  (Upper Respiratory Infection, Adult)   La infección respiratoria de las vías aéreas superiores se conoce también como resfrío común. Las vías aéreas superiores incluyen los senos nasales, la garganta, la tráquea, y los bronquios. Los bronquios son las vías aéreas que conducen el aire a los pulmones. La mayor parte de las personas mejora luego de una semana, pero los síntomas pueden durar hasta dos semanas. La tos residual puede durar más.  CAUSAS  Varios tipos de virus pueden causar la infección de los tejidos que cubren las vías aéreas superiores. Los tejidos se irritan y se inflaman y se originan secreciones. También es frecuente la producción de moco. El resfrío es contagioso. El virus se disemina fácilmente a otras personas por contacto oral. Aquí se incluyen los besos, el compartir un vaso y el toser o estornudar. También puede diseminarse tocándose la boca o la nariz y luego tocando una superficie que luego tocan otras personas.   SÍNTOMAS  Los síntomas se desarrollan entre uno y tres días luego de entrar en contacto con el virus. Pueden variar de una persona a otra. Incluyen:  · Secreción nasal.  · Estornudos  · Congestión nasal.  · Irritación de los senos nasales.  · Dolor de garganta.  · Pérdida de la voz (laringitis).  · Tos.  · Fatiga.  · Dolores musculares.  · Pérdida del apetito.  · Dolor de cabeza.  · Fiebre no muy elevada.  DIAGNÓSTICO  Puede diagnosticarse a sí mismo la infección respiratoria, según los síntomas habituales, ya que la mayor parte de las personas se resfría dos o tres veces al año. El profesional puede confirmarlo basándose en el examen físico. Lo más importante es que el profesional verifique que los síntomas no se deben a otra enfermedad como anginas, sinusitis, neumonía, asma o epiglotitis. Para diagnosticar el resfrio común, no es necesario que haga análisis de sangre, pruebas en la garganta o radiografías, pero en algunos  casos puede ser de utilidad para excluir otros problemas más graves. El médico decidirá si necesita otras pruebas.  RIESGOS Y COMPLICACIONES  Tendrá mayor riesgo de sufrir un resfrío grave si consume cigarrillos, sufre una enfermedad cardíaca (como insuficiencia cardíaca) o pulmonar crónica (como asma) o si tiene un debilitamiento del sistema inmunológico. Las personas muy jóvenes o muy mayores tienen riesgo de sufrir infecciones más graves. La sinusitis bacteriana, las infecciones del oído medio y la neumonía bacteriana pueden complicar el resfrío común. El resfrío puede exacerbar el asma y la enfermedad pulmonar obstructiva crónica. En algunos casos estas complicaciones requieren la atención en un servicio de emergencias y pueden poner en peligro la vida.  PREVENCIÓN  La mejor manera de protegerse para no contraer un resfrío es mantener una buena higiene. Evite el contacto bucal o de las manos con personas con síntomas de resfrío. Si se produce el contacto, lávese las manos con frecuencia. No hay pruebas firmes que indiquen que la vitamina C, la vitamina E, la equinácea o la actividad física reduzcan las posibilidades de tener una infección. Sin embargo, siempre se recomienda descansar mucho y tener una buena nutrición.  TRATAMIENTO  El tratamiento está dirigido a aliviar los síntomas. Esta enfermedad no tiene cura. Los antibióticos no son eficaces, ya que esta infección la causa un virus y no una bacteria. El tratamiento incluye:  · Aumente la ingesta de líquidos. Consumo de bebidas deportivas, que proporcionan electrolitos,azúcares e hidratación.  · Inhale vapor caliente (de un vaporizador o de   la ducha).  · Tomar sopa de pollo u otros líquidos claros, y mantener una buena nutrición.  · Descanse lo suficiente.  · Haga gárgaras o coma pastillas para aliviar las molestias.  · Control de la fiebre con ibuprofeno o acetaminofen, según las indicaciones del médico.  · Aumento del uso del inhalador, si sufre asma.  Las  pastillas y los geles de zinc durante las primeras 24 horas de iniciado el resfrío común, pueden disminuir la duración y aliviar la gravedad de los síntomas. Los medicamentos para el dolor pueden disminuir la fiebre, aliviar los dolores musculares y el dolor de garganta. Se dispone de una gran variedad de medicamentos de venta libre para tratar la congestión y la secreción nasal. El profesional podrá recomendarle inhalantes para los otros síntomas.  INSTRUCCIONES PARA EL CUIDADO DOMICILIARIO  · Utilice los medicamentos de venta libre o de prescripción para el dolor, el malestar o la fiebre, según se lo indique el profesional que lo asiste.  · Utilice un vaporizador caliente o inhale vapor, haciendo salir agua de la ducha para aumentar la humedad ambiente. Esto mantendrá las secreciones húmedas y le resultará más fácil respirar.  · Beba gran cantidad de líquido para mantener la orina de tono claro o color amarillo pálido.  · Descanse todo lo que pueda.  · Regrese a su trabajo cuando la temperatura se haya normalizado, o cuando el profesional que lo asiste se lo indique. Quizás sea necesario que permanezca en su casa durante un tiempo más prolongado para evitar infectar a otras personas. También puede utilizar un barbijo y ser cuidadoso con el lavado de manos para evitar la diseminación del virus.  SOLICITE ATENCIÓN MÉDICA SI:  · Luego de los primeros días siente que empeora en vez de mejorar.  · Necesita que el profesional le brinde más información relacionada con los medicamentos para controlar los síntomas.  · Siente escalofríos, le falta el aire o escupe moco de color marrón o rojo. Estos pueden ser síntomas de neumonía.  · Tiene una secreción nasal de color amarillo o marrón, o siente dolor en el rostro, especialmente cuando se inclina hacia adelante. Estos pueden ser síntomas de sinusitis.  · Tiene fiebre, siente el cuello hinchado, tiene dolor al tragar u observa manchas blancas en el fondo de la garganta.  Estos pueden ser síntomas de angina por estreptococo.  SOLICITE ATENCIÓN MÉDICA DE INMEDIATO SI:  · Tiene fiebre.  · Comienza a sentir un dolor de cabeza intenso o persistente, dolor de oídos, en el seno nasal o en el pecho.  · Tiene tos y esta se prolonga demasiado, tose y escupe sangre, la mucosidad habitual se modifica (si tiene una enfermedad pulmonar crónica) o respira con dificultad.  · Siente rigidez en el cuello o dolor de cabeza intenso.  Document Released: 04/28/2005 Document Revised: 10/11/2011  [employer]® Patient Information ©2014 [employer].

## 2013-05-21 NOTE — Progress Notes (Signed)
Patient ID: Kristen Holt, female   DOB: 09/14/1975, 37 y.o.   MRN: [id number] Patient Demographics  Kristen Holt, is a 37 y.o. female  AOZ:[id number]  NGE:[id number]  DOB - 09/14/1975  Chief Complaint  Patient presents with  . Follow-up        Subjective:   Kristen Holt is a 37 y.o. female here today for a follow up visit. She has been having sore throat, nasal congestion and cough for the past few days. She also experiences fever and generalized body aches. She has no history of contact with anyone with similar symptoms. She told me so on diagnosed with hypothyroidism in the past but she has not been using her medications, she wants to know if truly she has hypothyroidism. She has no significant symptoms, no constipation, no cold intolerance, no neck swelling. She denies being depressed. She does not smoke cigarette, she does not drink alcohol. Patient has No headache, No chest pain, No abdominal pain - No Nausea, No new weakness tingling or numbness, No Cough - SOB.  ALLERGIES: No Known Allergies  PAST MEDICAL HISTORY: Past Medical History  Diagnosis Date  . Diabetes mellitus     pre diabetic   . Acute appendicitis 01/13/2012  . Thyroid disease     MEDICATIONS AT HOME: Prior to Admission medications   Medication Sig Start Date End Date Taking? Authorizing Provider  fluticasone (FLONASE) 50 MCG/ACT nasal spray Place 2 sprays into the nose daily. 05/21/13  Yes Jeanann Lewandowsky, MD  azithromycin (ZITHROMAX) 250 MG tablet Take 2 tablets day 1, then 1 tab daily for 5 days 05/21/13   Jeanann Lewandowsky, MD  Benzocaine-Menthol (CEPACOL SORE THROAT) 10-2.1 MG LOZG Use of every 4 hours as needed 01/19/13   Richarda Overlie, MD  levothyroxine (SYNTHROID, LEVOTHROID) 25 MCG tablet Take 25 mcg by mouth daily.    Nurse Emergency, RN  naproxen (NAPROSYN) 500 MG tablet Take 1 po every 12 hours with food prn sore throat pain 02/01/13   Clanford Cyndie Mull, MD      Objective:   Filed Vitals:   05/21/13 0953  BP: 101/65  Pulse: 73  Temp: 99.4 F (37.4 C)  TempSrc: Oral  Resp: 16  Height: 5\' 2"  (1.575 m)  Weight: 142 lb (64.411 kg)  SpO2: 98%    Exam General appearance : Awake, alert, not in any distress. Speech Clear. Not toxic looking, nasal congestion HEENT: Atraumatic and Normocephalic, pupils equally reactive to light and accomodation Neck: supple, no JVD. No cervical lymphadenopathy.  Chest:Good air entry bilaterally, no added sounds  CVS: S1 S2 regular, no murmurs.  Abdomen: Bowel sounds present, Non tender and not distended with no gaurding, rigidity or rebound. Extremities: B/L Lower Ext shows no edema, both legs are warm to touch Neurology: Awake alert, and oriented X 3, CN II-XII intact, Non focal Skin:No Rash Wounds:N/A   Data Review   CBC No results found for this basename: WBC, HGB, HCT, PLT, MCV, MCH, MCHC, RDW, NEUTRABS, LYMPHSABS, MONOABS, EOSABS, BASOSABS, BANDABS, BANDSABD,  in the last 168 hours  Chemistries   No results found for this basename: NA, K, CL, CO2, GLUCOSE, BUN, CREATININE, GFRCGP, CALCIUM, MG, AST, ALT, ALKPHOS, BILITOT,  in the last 168 hours ------------------------------------------------------------------------------------------------------------------ No results found for this basename: HGBA1C,  in the last 72 hours ------------------------------------------------------------------------------------------------------------------ No results found for this basename: CHOL, HDL, LDLCALC, TRIG, CHOLHDL, LDLDIRECT,  in the last 72 hours ------------------------------------------------------------------------------------------------------------------ No results found for this basename: TSH, T4TOTAL, FREET3, T3FREE, THYROIDAB,  in  the last 72 hours ------------------------------------------------------------------------------------------------------------------ No results found for this basename:  VITAMINB12, FOLATE, FERRITIN, TIBC, IRON, RETICCTPCT,  in the last 72 hours  Coagulation profile  No results found for this basename: INR, PROTIME,  in the last 168 hours    Assessment & Plan   Patient Active Problem List   Diagnosis Date Noted  . Upper respiratory infection, acute 05/21/2013  . Streptococcal sore throat 02/01/2013  . Allergic rhinitis 02/01/2013  . Acute appendicitis 01/13/2012     Plan: Zithromax 500 mg by mouth day 1 then 250 mg by mouth daily for 5 days Flonase 50 mcg/ACT nasal spray twice daily for muscle congestion Ibuprofen 400 mg tablet by mouth every 6 hour when necessary fever  Labs: CBC differential TSH Free T4  Patient has been extensively counseled about upper respiratory tract infection and the possibility of being viral, patient educated on symptomatic treatment approach  Interpreter was used to communicate directly with patient for the entire encounter including providing detailed patient instructions.   Health Maintenance -Colonoscopy: Not applicable -Pap Smear: Scheduled for January 2015 -Mammogram: Not applicable -Vaccinations:  -Influenza given today  Follow up in 3 months or when necessary   The patient was given clear instructions to go to ER or return to medical center if symptoms don't improve, worsen or new problems develop. The patient verbalized understanding. The patient was told to call to get lab results if they haven't heard anything in the next week.    Jeanann Lewandowsky, MD, MHA, FACP, FAAP [facility] [place], [place] [phone]   05/21/2013, 10:09 AM

## 2013-05-21 NOTE — Progress Notes (Signed)
Pt is here today with flu like symptoms, fever, chills, widespread aching in her joints and bones. Symptoms started last Friday. Pt reports that for two years she has been dealing with rhinitis.

## 2013-05-22 ENCOUNTER — Encounter (HOSPITAL_COMMUNITY): Payer: Self-pay | Admitting: Emergency Medicine

## 2013-05-23 ENCOUNTER — Telehealth: Payer: Self-pay | Admitting: Emergency Medicine

## 2013-05-23 NOTE — Telephone Encounter (Signed)
Message copied by Darlis Loan on Wed May 23, 2013  9:48 AM ------      Message from: Quentin Angst      Created: Tue May 22, 2013  3:50 PM       Please let patient know that her thyroid function test as well as other lab tests are normal ------

## 2013-05-23 NOTE — Telephone Encounter (Signed)
Pt given lab results 

## 2013-05-24 ENCOUNTER — Telehealth: Payer: Self-pay | Admitting: Emergency Medicine

## 2013-05-24 NOTE — Telephone Encounter (Signed)
Pt came in as a walk in for c/o chills,body aches without fever or cough x 2 weeks. Pt is taking nasal spray for sinus congestion orthostats negative BP 95/63 Spoke with Dr. Elisabeth Pigeon- instructions to get plenty of fluids and rest and return if fever and cough Pt refused work note

## 2013-05-28 ENCOUNTER — Encounter (HOSPITAL_COMMUNITY): Payer: Self-pay | Admitting: Emergency Medicine

## 2013-05-28 ENCOUNTER — Emergency Department (INDEPENDENT_AMBULATORY_CARE_PROVIDER_SITE_OTHER): Payer: PRIVATE HEALTH INSURANCE

## 2013-05-28 ENCOUNTER — Emergency Department (HOSPITAL_COMMUNITY)
Admission: EM | Admit: 2013-05-28 | Discharge: 2013-05-28 | Disposition: A | Discharge status: Home / Self Care | Payer: PRIVATE HEALTH INSURANCE | Source: Home / Self Care | Attending: Emergency Medicine | Admitting: Emergency Medicine

## 2013-05-28 DIAGNOSIS — B349 Viral infection, unspecified: Secondary | ICD-10-CM

## 2013-05-28 DIAGNOSIS — K2970 Gastritis, unspecified, without bleeding: Secondary | ICD-10-CM

## 2013-05-28 DIAGNOSIS — B9789 Other viral agents as the cause of diseases classified elsewhere: Secondary | ICD-10-CM

## 2013-05-28 MED ORDER — IPRATROPIUM-ALBUTEROL 0.5-2.5 (3) MG/3ML IN SOLN
3.0000 mL | Freq: Once | RESPIRATORY_TRACT | Status: AC
  Administered 2013-05-28: 3 mL via RESPIRATORY_TRACT

## 2013-05-28 MED ORDER — ALBUTEROL SULFATE (5 MG/ML) 0.5% IN NEBU
INHALATION_SOLUTION | RESPIRATORY_TRACT | Status: AC
  Filled 2013-05-28: qty 1

## 2013-05-28 MED ORDER — IPRATROPIUM BROMIDE 0.02 % IN SOLN
RESPIRATORY_TRACT | Status: AC
  Filled 2013-05-28: qty 2.5

## 2013-05-28 MED ORDER — SODIUM CHLORIDE 0.9 % IN NEBU
INHALATION_SOLUTION | RESPIRATORY_TRACT | Status: AC
  Filled 2013-05-28: qty 3

## 2013-05-28 MED ORDER — PREDNISONE 10 MG PO KIT: PACK | ORAL | Status: DC

## 2013-05-28 MED ORDER — CHLORPHENIRAMINE-PSE-IBUPROFEN 2-30-200 MG PO TABS: ORAL_TABLET | ORAL | Status: DC

## 2013-05-28 NOTE — ED Provider Notes (Signed)
Medical screening examination/treatment/procedure(s) were performed by non-physician practitioner and as supervising physician I was immediately available for consultation/collaboration.  Arush Gatliff, M.D.  Greidy Sherard C Shishir Krantz, MD 05/28/13 1450 

## 2013-05-28 NOTE — ED Provider Notes (Signed)
CSN: [id number]     Arrival date & time 05/28/13  1130 History   None    Chief Complaint  Patient presents with  . URI   (Consider location/radiation/quality/duration/timing/severity/associated sxs/prior Treatment) HPI Comments: 37 year old female presents complaining of upper respiratory infection symptoms for the past 2 weeks. About one week ago she saw her primary care physician who prescribed Flonase and a Z-Pak. This has not helped at all with her symptoms. She continues to have body aches, subjective fevers, cough, and nasal congestion. She took the medications as prescribed he continues to use the nasal spray. She denies nausea, vomiting, rash, sick contacts, recent travel. She has taken over-the-counter medications also without relief.  Patient is a 37 y.o. female presenting with URI.  URI Presenting symptoms: congestion, cough and fatigue   Presenting symptoms: no fever   Associated symptoms: arthralgias and myalgias     Past Medical History  Diagnosis Date  . Diabetes mellitus     pre diabetic   . Acute appendicitis 01/13/2012  . Thyroid disease    Past Surgical History  Procedure Laterality Date  . Laparoscopic appendectomy  01/18/2012    Procedure: APPENDECTOMY LAPAROSCOPIC;  Surgeon: Currie Paris, MD;  Location: [facility] OR;  Service: General;  Laterality: N/A;  . Appendectomy     Family History  Problem Relation Age of Onset  . Diabetes Mother    History  Substance Use Topics  . Smoking status: Never Smoker   . Smokeless tobacco: Never Used  . Alcohol Use: No   OB History   Grav Para Term Preterm Abortions TAB SAB Ect Mult Living                 Review of Systems  Constitutional: Positive for fatigue. Negative for fever and chills.  HENT: Positive for congestion.   Eyes: Negative for visual disturbance.  Respiratory: Positive for cough. Negative for shortness of breath.   Cardiovascular: Negative for chest pain, palpitations and leg swelling.   Gastrointestinal: Negative for nausea, vomiting and abdominal pain.  Endocrine: Negative for polydipsia and polyuria.  Genitourinary: Negative for dysuria, urgency and frequency.  Musculoskeletal: Positive for arthralgias and myalgias.  Skin: Negative for rash.  Neurological: Negative for dizziness, weakness and light-headedness.    Allergies  Review of patient's allergies indicates no known allergies.  Home Medications   Current Outpatient Rx  Name  Route  Sig  Dispense  Refill  . amoxicillin-clavulanate (AUGMENTIN) 875-125 MG per tablet   Oral   Take 1 tablet by mouth 2 (two) times daily.   20 tablet   0   . azithromycin (ZITHROMAX) 250 MG tablet      Take 2 tablets day 1, then 1 tab daily for 5 days   6 tablet   0   . Benzocaine-Menthol (CEPACOL SORE THROAT) 10-2.1 MG LOZG      Use of every 4 hours as needed   100 each   0   . bismuth subsalicylate (PEPTO BISMOL) 262 MG/15ML suspension   Oral   Take 15 mLs by mouth every 6 (six) hours as needed for indigestion.         . castor oil liquid   Oral   Take by mouth.         . Chlorpheniramine-PSE-Ibuprofen (ADVIL ALLERGY SINUS) 2-30-200 MG TABS      1-2 tabs PO Q4-6 hrs PRN   50 each   1   . diphenoxylate-atropine (LOMOTIL) 2.5-0.025 MG per tablet   Oral  Take 1 tablet by mouth 4 (four) times daily as needed for diarrhea or loose stools.   16 tablet   0   . fluticasone (FLONASE) 50 MCG/ACT nasal spray   Nasal   Place 2 sprays into the nose daily.   16 g   6   . levothyroxine (SYNTHROID, LEVOTHROID) 25 MCG tablet   Oral   Take 25 mcg by mouth daily.         Marland Kitchen loratadine (CLARITIN) 10 MG tablet   Oral   Take 1 tablet (10 mg total) by mouth daily.   30 tablet   0   . naproxen (NAPROSYN) 500 MG tablet      Take 1 po every 12 hours with food prn sore throat pain   20 tablet   0   . omeprazole (PRILOSEC) 20 MG capsule   Oral   Take 1 capsule (20 mg total) by mouth daily.   15 capsule    0   . ondansetron (ZOFRAN ODT) 8 MG disintegrating tablet   Oral   Take 1 tablet (8 mg total) by mouth every 8 (eight) hours as needed for nausea.   20 tablet   0   . oxyCODONE-acetaminophen (PERCOCET) 5-325 MG per tablet      1 to 2 tablets every 6 hours as needed for pain.   20 tablet   0   . PredniSONE 10 MG KIT      12 day taper dose pack. Use as directed   48 each   0   . Ranitidine HCl (ZANTAC PO)   Oral   Take by mouth.         . sodium-potassium bicarbonate (ALKA-SELTZER GOLD) TBEF   Oral   Take 1 tablet by mouth daily as needed.          BP 109/70  Pulse 70  Temp(Src) 98.2 F (36.8 C) (Oral)  Resp 16  SpO2 100%  LMP 05/22/2013 Physical Exam  Nursing note and vitals reviewed. Constitutional: She is oriented to person, place, and time. Vital signs are normal. She appears well-developed and well-nourished. No distress.  HENT:  Head: Normocephalic and atraumatic.  Right Ear: External ear normal.  Left Ear: External ear normal.  Nose: Nose normal.  Mouth/Throat: Oropharynx is clear and moist. No oropharyngeal exudate.  Eyes: Conjunctivae and EOM are normal. Pupils are equal, round, and reactive to light. Right eye exhibits no discharge. Left eye exhibits no discharge. No scleral icterus.  Neck: Normal range of motion. Neck supple. No JVD present. No thyromegaly present.  Cardiovascular: Normal rate, regular rhythm and normal heart sounds.   Pulmonary/Chest: Effort normal. No respiratory distress. She has wheezes (LUL only).  Lymphadenopathy:    She has no cervical adenopathy.  Neurological: She is alert and oriented to person, place, and time. She has normal strength. Coordination normal.  Skin: Skin is warm and dry. No rash noted. She is not diaphoretic.  Psychiatric: She has a normal mood and affect. Judgment normal.    ED Course  Procedures (including critical care time) Labs Review Labs Reviewed  POCT INFECTIOUS MONO SCREEN   Imaging  Review Dg Chest 2 View  05/28/2013   CLINICAL DATA:  Chest pain and headache.  EXAM: CHEST  2 VIEW  COMPARISON:  02/23/2013.  FINDINGS: The heart size and mediastinal contours are within normal limits. Both lungs are clear. The visualized skeletal structures are unremarkable.  IMPRESSION: No active cardiopulmonary disease.   Electronically Signed  By: Loralie Champagne M.D.   On: 05/28/2013 13:50    Mono is negative. Giving a breathing treatment and getting a chest x-ray.  MDM   1. Viral syndrome    Chest x-ray negative. This is most likely a virus similar to Mono given the normal physical exam and vague subjective complaints. Treat with Advil allergy sinus symptoms and with Sterapred dosepak. Continue to use the Flonase. Followup as needed if not improving.   Meds ordered this encounter  Medications  . ipratropium-albuterol (DUONEB) 0.5-2.5 (3) MG/3ML nebulizer solution 3 mL    Sig:   . PredniSONE 10 MG KIT    Sig: 12 day taper dose pack. Use as directed    Dispense:  48 each    Refill:  0    Order Specific Question:  Supervising Provider    Answer:  Lorenz Coaster, DAVID C [id number]  . Chlorpheniramine-PSE-Ibuprofen (ADVIL ALLERGY SINUS) 2-30-200 MG TABS    Sig: 1-2 tabs PO Q4-6 hrs PRN    Dispense:  50 each    Refill:  1    Order Specific Question:  Supervising Provider    Answer:  Lorenz Coaster, DAVID C [[id number]]       Graylon Good, PA-C 05/28/13 1410

## 2013-05-28 NOTE — ED Notes (Addendum)
c/o URI type syx for 2 weeks (body aches, fever) no relief w OTC medications; recent z-pack, flu shot at [employer], wellness clinic

## 2013-08-17 ENCOUNTER — Emergency Department (HOSPITAL_COMMUNITY)
Admission: EM | Admit: 2013-08-17 | Discharge: 2013-08-17 | Disposition: A | Discharge status: Home / Self Care | Payer: PRIVATE HEALTH INSURANCE | Source: Home / Self Care | Attending: Family Medicine | Admitting: Family Medicine

## 2013-08-17 ENCOUNTER — Encounter (HOSPITAL_COMMUNITY): Payer: Self-pay | Admitting: Emergency Medicine

## 2013-08-17 DIAGNOSIS — R899 Unspecified abnormal finding in specimens from other organs, systems and tissues: Secondary | ICD-10-CM

## 2013-08-17 DIAGNOSIS — J029 Acute pharyngitis, unspecified: Secondary | ICD-10-CM

## 2013-08-17 DIAGNOSIS — R6889 Other general symptoms and signs: Secondary | ICD-10-CM

## 2013-08-17 LAB — BASIC METABOLIC PANEL
BUN: 15 mg/dL (ref 6–23)
CALCIUM: 8.9 mg/dL (ref 8.4–10.5)
CO2: 24 meq/L (ref 19–32)
CREATININE: 0.5 mg/dL (ref 0.50–1.10)
Chloride: 102 mEq/L (ref 96–112)
GFR calc Af Amer: >90 mL/min (ref >90)
GFR calc non Af Amer: >90 mL/min (ref >90)
GLUCOSE: 84 mg/dL (ref 70–99)
Potassium: 4.1 mEq/L (ref 3.7–5.3)
Sodium: 138 mEq/L (ref 137–147)

## 2013-08-17 LAB — CBC
HEMATOCRIT: 38 % (ref 36.0–46.0)
Hemoglobin: 13 g/dL (ref 12.0–15.0)
MCH: 30.2 pg (ref 26.0–34.0)
MCHC: 34.2 g/dL (ref 30.0–36.0)
MCV: 88.4 fL (ref 78.0–100.0)
Platelets: 329 10*3/uL (ref 150–400)
RBC: 4.3 MIL/uL (ref 3.87–5.11)
RDW: 13.5 % (ref 11.5–15.5)
WBC: 7.4 10*3/uL (ref 4.0–10.5)

## 2013-08-17 LAB — POCT RAPID STREP A: Streptococcus, Group A Screen (Direct): NEGATIVE

## 2013-08-17 LAB — SEDIMENTATION RATE: Sed Rate: 7 mm/hr (ref 0–22)

## 2013-08-17 LAB — TSH: TSH: 3.526 u[IU]/mL (ref 0.350–4.500)

## 2013-08-17 MED ORDER — IPRATROPIUM BROMIDE 0.06 % NA SOLN: 2.0000 | Freq: Four times a day (QID) | NASAL | Status: DC

## 2013-08-17 MED ORDER — PREDNISONE 10 MG PO TABS: 30.0000 mg | ORAL_TABLET | Freq: Every day | ORAL | Status: DC

## 2013-08-17 NOTE — ED Notes (Addendum)
TSH 3.526, sed rate 7,  WNL. ANA and throat culture pending.  Message to Dr. Denyse Amassorey. Vassie MoselleYork, Talis Iwan M 08/17/2013 ANA and throat culture neg.  Dr. Denyse Amassorey said not further action needed. 08/20/2013

## 2013-08-17 NOTE — Discharge Instructions (Signed)
Thank you for coming in today. Take prednisone daily for 5 days.  Use the nasal spray as needed.  Call or go to the emergency room if you get worse, have trouble breathing, have chest pains, or palpitations.   [facility] [address] [place], [place] [address] [phone] I will call you with lab results.

## 2013-08-17 NOTE — ED Provider Notes (Signed)
Kristen Holt is a 38 y.o. female who presents to Urgent Care today for one week of fevers chills body aches sore throat and ear pain. She has not tried any medications yet. She denies any shortness of breath cough chest pains or palpitations. She notes muscle aches and joint pain diffusely.  Of note in November patient had labs a different office that showed a mildly positive ANA at 1:40 she was treated with a prednisone Dosepak and got better. This is the first time her joint aches have returned since that initial treatment. She has never had followup with rheumatology.   Past Medical History  Diagnosis Date  . Diabetes mellitus     pre diabetic   . Acute appendicitis 01/13/2012  . Thyroid disease    History  Substance Use Topics  . Smoking status: Never Smoker   . Smokeless tobacco: Never Used  . Alcohol Use: No   ROS as above Medications: No current facility-administered medications for this encounter.   Current Outpatient Prescriptions  Medication Sig Dispense Refill  . castor oil liquid Take by mouth.      . fluticasone (FLONASE) 50 MCG/ACT nasal spray Place 2 sprays into the nose daily.  16 g  6  . ipratropium (ATROVENT) 0.06 % nasal spray Place 2 sprays into both nostrils 4 (four) times daily.  15 mL  1  . levothyroxine (SYNTHROID, LEVOTHROID) 25 MCG tablet Take 25 mcg by mouth daily.      Marland Kitchen. loratadine (CLARITIN) 10 MG tablet Take 1 tablet (10 mg total) by mouth daily.  30 tablet  0  . omeprazole (PRILOSEC) 20 MG capsule Take 1 capsule (20 mg total) by mouth daily.  15 capsule  0  . predniSONE (DELTASONE) 10 MG tablet Take 3 tablets (30 mg total) by mouth daily.  15 tablet  0  . [DISCONTINUED] Ranitidine HCl (ZANTAC PO) Take by mouth.        Exam:  BP 114/67  Pulse 68  Temp(Src) 98.4 F (36.9 C) (Oral)  Resp 16  SpO2 100%  LMP 07/29/2013 Gen: Well NAD HEENT: EOMI,  MMM, normal appearing tympanic membranes. Posterior pharynx with cobblestoning. Mild  nontender anterior cervical lymphadenopathy is present bilaterally. Lungs: Normal work of breathing. CTABL Heart: RRR no MRG Abd: NABS, Soft. NT, ND Exts: Brisk capillary refill, warm and well perfused.  No synovitis noted in the hands or wrists bilaterally.  Rapid strep test is negative  Assessment and Plan: 38 y.o. female with resolving viral URI versus viral pharyngitis. Plan to treat with Atrovent nasal spray, and low-dose prednisone. Additionally recommend Tylenol. Followup with primary care provider.  Patient also had a mildly elevated ANA just past the cut off range in November. I am doubtful this is a diagnostic lab finding. Plan to reobtain ANA as well as CBC basic metabolic panel and sedimentation rate. Call patient with results of significantly abnormal. Followup with primary care provider  Discussed warning signs or symptoms. Please see discharge instructions. Patient expresses understanding.    Rodolph BongEvan S Myrikal Messmer, MD 08/17/13 [phone]

## 2013-08-17 NOTE — ED Notes (Signed)
C/o fever/ chills States right side of her head gets real hot Was given meds  States she does have a runny nose and has been sneezing but feels as if that is her allergies

## 2013-08-19 LAB — CULTURE, GROUP A STREP

## 2013-08-20 LAB — ANA: ANA: NEGATIVE

## 2013-08-21 ENCOUNTER — Ambulatory Visit: Payer: PRIVATE HEALTH INSURANCE | Attending: Internal Medicine | Admitting: Internal Medicine

## 2013-08-21 ENCOUNTER — Encounter: Payer: Self-pay | Admitting: Internal Medicine

## 2013-08-21 ENCOUNTER — Other Ambulatory Visit (HOSPITAL_COMMUNITY)
Admission: RE | Admit: 2013-08-21 | Discharge: 2013-08-21 | Disposition: A | Discharge status: Home / Self Care | Payer: PRIVATE HEALTH INSURANCE | Source: Ambulatory Visit | Attending: Internal Medicine | Admitting: Internal Medicine

## 2013-08-21 VITALS — BP 103/67 | HR 71 | Temp 97.8°F | Resp 16 | Ht 62.0 in | Wt 156.0 lb

## 2013-08-21 DIAGNOSIS — J069 Acute upper respiratory infection, unspecified: Secondary | ICD-10-CM | POA: Insufficient documentation

## 2013-08-21 DIAGNOSIS — N760 Acute vaginitis: Secondary | ICD-10-CM | POA: Insufficient documentation

## 2013-08-21 DIAGNOSIS — Z01419 Encounter for gynecological examination (general) (routine) without abnormal findings: Secondary | ICD-10-CM | POA: Insufficient documentation

## 2013-08-21 DIAGNOSIS — Z1151 Encounter for screening for human papillomavirus (HPV): Secondary | ICD-10-CM | POA: Insufficient documentation

## 2013-08-21 DIAGNOSIS — Z113 Encounter for screening for infections with a predominantly sexual mode of transmission: Secondary | ICD-10-CM | POA: Insufficient documentation

## 2013-08-21 DIAGNOSIS — Z124 Encounter for screening for malignant neoplasm of cervix: Secondary | ICD-10-CM | POA: Insufficient documentation

## 2013-08-21 MED ORDER — AMOXICILLIN-POT CLAVULANATE 875-125 MG PO TABS: 1.0000 | ORAL_TABLET | Freq: Two times a day (BID) | ORAL | Status: DC

## 2013-08-21 MED ORDER — FLUTICASONE PROPIONATE 50 MCG/ACT NA SUSP: 2.0000 | Freq: Every day | NASAL | Status: DC

## 2013-08-21 MED ORDER — LORATADINE 10 MG PO TABS: 10.0000 mg | ORAL_TABLET | Freq: Every day | ORAL | Status: DC

## 2013-08-21 NOTE — Patient Instructions (Signed)
Infeccin de las vas areas superiores en los adultos (Upper Respiratory Infection, Adult)  La infeccin de las vas areas superiores tambin se conoce como resfro comn. La causa es un tipo de germen (virus). Los resfros se diseminan fcilmente (son contagiosos). Puede transmitirlo a los dems al besar, toser, estornudar o beber en el mismo vaso. Generalmente se cura en 1 a 2 semanas.  CUIDADOS EN EL HOGAR   Tome la medicacin segn las indicaciones.  Use un humidificador caliente o respire el vapor en una ducha caliente.  Beba gran cantidad de lquido para mantener la orina de tono claro o color amarillo plido.  Debe hacer reposo.  Regrese a su trabajo cuando la temperatura se haya normalizado, o cuando el mdico se lo indique. Puede usar un barbijo y lavarse las manos para evitar que el resfro se contagie. SOLICITE AYUDA DE INMEDIATO SI:   Luego de los primeros das siente que empeora en vez de mejorar.  Tiene dudas relacionadas con los medicamentos.  Siente escalofros, le falta el aire o escupe moco de color marrn o rojo.  Tiene una secrecin nasal de color amarillo o marrn, o siente dolor en el rostro, especialmente cuando se inclina hacia adelante.  Tiene fiebre, siente el cuello hinchado, tiene dolor al tragar u observa manchas blancas en el fondo de la garganta.  Comienza a sentir un dolor de cabeza intenso o persistente, dolor de odos, en el seno nasal o en el pecho.  Al respirar emite un sonido similar a un silbido (sibilancias).  Siente falta de aire o escupe sangre al toser.  Tiene dolores musculares o rigidez en el cuello. ASEGRESE DE QUE:   Comprende estas instrucciones.  Controlar su enfermedad.  Solicitar ayuda de inmediato si no mejora o si empeora. Document Released: 12/21/2010 Document Revised: 10/11/2011 ExitCare Patient Information 2014 [employer].  

## 2013-08-21 NOTE — Progress Notes (Signed)
Pt is here requesting a pap smear. She also reports having a headache and burning in her left side of her head. She states that when she wakes up feeling sick.

## 2013-08-21 NOTE — Progress Notes (Signed)
Patient ID: Kristen Holt, female   DOB: 09/14/1975, 38 y.o.   MRN: [id number] Patient Demographics  Kristen Holt, is a 38 y.o. female  WIO:[id number]  EQA:[id number]  DOB - 09/14/1975  Chief Complaint  Patient presents with  . Follow-up        Subjective:   Kristen Holt is a 38 y.o. female here today for a follow up visit. Patient is here today mainly for Pap smear. She was however seen recently in the urgent care for cough and sneezing and symptoms of upper respiratory tract infections. She was extensively worked up on all tests were negative including throat swab for strept, ANA, CBC, CMP, ESR. Patient continue to have the same symptoms of sneezing and coughing and runny nose and congestion with blocked nostrils. Previous chest x-ray will review which showed no abnormality. Patient has not taken antibiotics lately. She does not smoke cigarette, she does not drink alcohol. Patient has No headache, No chest pain, No abdominal pain - No Nausea, No new weakness tingling or numbness, No Cough - SOB.  ALLERGIES: No Known Allergies  PAST MEDICAL HISTORY: Past Medical History  Diagnosis Date  . Diabetes mellitus     pre diabetic   . Acute appendicitis 01/13/2012  . Thyroid disease     MEDICATIONS AT HOME: Prior to Admission medications   Medication Sig Start Date End Date Taking? Authorizing Provider  amoxicillin-clavulanate (AUGMENTIN) 875-125 MG per tablet Take 1 tablet by mouth 2 (two) times daily. 08/21/13   Angelica Chessman, MD  castor oil liquid Take by mouth.    Historical Provider, MD  fluticasone (FLONASE) 50 MCG/ACT nasal spray Place 2 sprays into both nostrils daily. 08/21/13   Angelica Chessman, MD  ipratropium (ATROVENT) 0.06 % nasal spray Place 2 sprays into both nostrils 4 (four) times daily. 08/17/13   Gregor Hams, MD  levothyroxine (SYNTHROID, LEVOTHROID) 25 MCG tablet Take 25 mcg by mouth daily.    Nurse Emergency, RN  loratadine  (CLARITIN) 10 MG tablet Take 1 tablet (10 mg total) by mouth daily. 08/21/13   Angelica Chessman, MD  omeprazole (PRILOSEC) 20 MG capsule Take 1 capsule (20 mg total) by mouth daily. 02/23/13   Harden Mo, MD  predniSONE (DELTASONE) 10 MG tablet Take 3 tablets (30 mg total) by mouth daily. 08/17/13   Gregor Hams, MD     Objective:   Filed Vitals:   08/21/13 0955  BP: 103/67  Pulse: 71  Temp: 97.8 F (36.6 C)  TempSrc: Oral  Resp: 16  Height: $Remove'5\' 2"'fJVMCqg$  (1.575 m)  Weight: 156 lb (70.761 kg)  SpO2: 100%    Exam General appearance : Awake, alert, not in any distress. Speech Clear. Not toxic looking, nasal speech HEENT: Congested, Atraumatic and Normocephalic, pupils equally reactive to light and accomodation Neck: supple, no JVD. No cervical lymphadenopathy.  Chest:Good air entry bilaterally, no added sounds  CVS: S1 S2 regular, no murmurs.  Abdomen: Bowel sounds present, Non tender and not distended with no gaurding, rigidity or rebound. Extremities: B/L Lower Ext shows no edema, both legs are warm to touch Neurology: Awake alert, and oriented X 3, CN II-XII intact, Non focal Skin:No Rash Wounds:N/A Pelvic examination: Normal female external genitalia, central cervix, patulous, cervical excitation tenderness negative.  Data Review   CBC  Recent Labs Lab 08/17/13 1207  WBC 7.4  HGB 13.0  HCT 38.0  PLT 329  MCV 88.4  MCH 30.2  MCHC 34.2  RDW 13.5    Chemistries  Recent Labs Lab 08/17/13 1207  NA 138  K 4.1  CL 102  CO2 24  GLUCOSE 84  BUN 15  CREATININE 0.50  CALCIUM 8.9   ------------------------------------------------------------------------------------------------------------------ No results found for this basename: HGBA1C,  in the last 72 hours ------------------------------------------------------------------------------------------------------------------ No results found for this basename: CHOL, HDL, LDLCALC, TRIG, CHOLHDL, LDLDIRECT,  in the  last 72 hours ------------------------------------------------------------------------------------------------------------------ No results found for this basename: TSH, T4TOTAL, FREET3, T3FREE, THYROIDAB,  in the last 72 hours ------------------------------------------------------------------------------------------------------------------ No results found for this basename: VITAMINB12, FOLATE, FERRITIN, TIBC, IRON, RETICCTPCT,  in the last 72 hours  Coagulation profile  No results found for this basename: INR, PROTIME,  in the last 168 hours    Assessment & Plan  All laboratory results and workup were refilled by me and 've explained to patient  1. Upper respiratory infection, acute  - amoxicillin-clavulanate (AUGMENTIN) 875-125 MG per tablet; Take 1 tablet by mouth 2 (two) times daily.  Dispense: 20 tablet; Refill: 0 - loratadine (CLARITIN) 10 MG tablet; Take 1 tablet (10 mg total) by mouth daily.  Dispense: 30 tablet; Refill: 0 - fluticasone (FLONASE) 50 MCG/ACT nasal spray; Place 2 sprays into both nostrils daily.  Dispense: 16 g; Refill: 0  2. Pap smear for cervical cancer screening  - Cytology - PAP - Cervicovaginal ancillary only Sent to the laboratory for cytology and ancillary tests  Follow up in 3 months or when necessary   Interpreter was used to communicate directly with patient for the entire encounter including providing detailed patient instructions.   The patient was given clear instructions to go to ER or return to medical center if symptoms don't improve, worsen or new problems develop. The patient verbalized understanding. The patient was told to call to get lab results if they haven't heard anything in the next week.    Angelica Chessman, MD, [place], [place], [place] and [place], [place]   08/21/2013, 10:56 AM

## 2013-08-22 ENCOUNTER — Telehealth: Payer: Self-pay | Admitting: *Deleted

## 2013-08-22 NOTE — Telephone Encounter (Signed)
Message copied by Raynelle CharyWINFREE, Ahsha Hinsley R on Wed Aug 22, 2013  9:08 AM ------      Message from: Quentin AngstJEGEDE, OLUGBEMIGA E      Created: Tue Aug 21, 2013  4:45 PM       Please inform patient that her swab tests result is negative for infection. We are waiting for the Pap smear cytology ------

## 2013-08-22 NOTE — Telephone Encounter (Signed)
I spoke to the pt and informed her that the swab test came back normal and that I would call her when the pap smear results were in.

## 2013-08-25 ENCOUNTER — Telehealth: Payer: Self-pay

## 2013-08-25 NOTE — Telephone Encounter (Signed)
Interpreter line used Patient is aware of her pap smear results

## 2013-08-25 NOTE — Telephone Encounter (Signed)
Message copied by Lestine MountJUAREZ, Kevonte Vanecek L on Sat Aug 25, 2013  9:30 AM ------      Message from: Quentin AngstJEGEDE, OLUGBEMIGA E      Created: Fri Aug 24, 2013  5:08 PM       Please inform patient that her Pap smear result is normal, negative for malignancy ------

## 2013-09-06 ENCOUNTER — Ambulatory Visit: Payer: PRIVATE HEALTH INSURANCE | Attending: Internal Medicine | Admitting: Family Medicine

## 2013-09-06 ENCOUNTER — Encounter: Payer: Self-pay | Admitting: Family Medicine

## 2013-09-06 VITALS — BP 122/76 | HR 69 | Temp 98.0°F | Resp 14 | Ht 62.0 in | Wt 155.0 lb

## 2013-09-06 DIAGNOSIS — J069 Acute upper respiratory infection, unspecified: Secondary | ICD-10-CM

## 2013-09-06 DIAGNOSIS — R61 Generalized hyperhidrosis: Secondary | ICD-10-CM

## 2013-09-06 MED ORDER — LORATADINE 10 MG PO TABS: 10.0000 mg | ORAL_TABLET | Freq: Every day | ORAL | Status: DC

## 2013-09-06 MED ORDER — FLUTICASONE PROPIONATE 50 MCG/ACT NA SUSP: 2.0000 | Freq: Every day | NASAL | Status: DC

## 2013-09-06 NOTE — Patient Instructions (Addendum)
Be sure you STOP omeprazole and ipratropium, if you are still taking them.

## 2013-09-06 NOTE — Progress Notes (Unsigned)
Subjective:    Patient ID: Kristen Holt, female    DOB: 09/14/1975, 38 y.o.   MRN: [id number]  HPI This patient is here in followup today. Information is obtained via an and person translator.  Problem #1: Night sweats and hot flashes. These have been present for about 3 months. She used to have associated knee pain but the knee pain has improved. She occasionally has discomfort in her hands and feet but isn't sure if they are related to the hot flushes. She gets awakened about 5 times a night due to being very sweaty and feeling hot. During the day she also feels hot especially on the palms of her hands, soles of her feet, and in her head. She sometimes feels a heat rising from her stomach. She has checked her temperature and does not actually have a fever. Labs checked so far have all been within normal limits including her thyroid as she is on Synthroid.  Problem #2: Chronic sinus congestion. The patient points to azithromycin on a paper and tells me that she would like a refill on this as it is "the only thing that has ever helped". She has tried [employer] but did not feel it helped. She tried saline but did not feel it helped. She also tried Claritin but did not feel it helped. Therefore she did not continue any of these. She did not ever try more than one thing at the same time period with Flonase she was using it looking up at the ceiling and aiming at straight up. She denies any sinus pain or pressure at this time also denies fever or bloody discharge. I note that she was prescribed Augmentin about 2 weeks ago for sinusitis.  Review of Systems A 12 point review of systems is negative except as per hpi.      Objective:   Physical Exam  Nursing note and vitals reviewed. Constitutional: She is oriented to person, place, and time. She appears well-developed and well-nourished.  HENT:  Right Ear: External ear normal.  Left Ear: External ear normal.  Nose: Nose normal. No sinus  ttp Mouth/Throat: Oropharynx is clear and moist. No oropharyngeal exudate.  Eyes: Conjunctivae are normal. Pupils are equal, round, and reactive to light.  Neck: Normal range of motion. Neck supple. No thyromegaly present.  Cardiovascular: Normal rate, regular rhythm and normal heart sounds.   Pulmonary/Chest: Effort normal and breath sounds normal.  Abdominal: Soft. Bowel sounds are normal. She exhibits no distension. There is no tenderness. There is no rebound.  Lymphadenopathy:    She has no cervical adenopathy.  Neurological: She is alert and oriented to person, place, and time. She has normal reflexes.  Skin: Skin is warm and dry.  Psychiatric: She has a normal mood and affect. Her behavior is normal. She is a little agitated, especially when denied the azithromycin.  msk - no edema or swelling. FROM without crepitus. No ttp.      Assessment & Plan:  Gregory was seen today for office visit.  Diagnoses and associated orders for this visit:  Upper respiratory infection, acute - loratadine (CLARITIN) 10 MG tablet; Take 1 tablet (10 mg total) by mouth daily. - fluticasone (FLONASE) 50 MCG/ACT nasal spray; Place 2 sprays into both nostrils daily. - I've discussed with the patient the importance of taking the Claritin and Flonase and using saline rinses to alleviate her chronic sinusitis symptoms. I suspect this is due to allergic rhinitis. I've emphasized that azithromycin is not the treatment  of choice for sinus infection. Further I don't think that she currently has a sinus infection. She was treated very appropriately 2 weeks ago and I think her symptoms right now are her baseline chronic allergic rhinitis/sinusitis. We discussed aiming the Flonase toward the ipsilateral eyeball and looking down at her feet to obtain proper angle. We also discussed using the saline rinse at night and the Flonase in the morning to avoid rinse and Flonase away. Night sweats - PPD - DG Chest 2  View - HIV antibody - Follicle Stimulating Hormone - Metanephrines, urine, 24 hour; Future - Catecholamines, fractionated, urine, 24 hour; Future - 5 HIAA w/Creatinine, 24 hr. - I've asked her to stop the omeprazole as well as ipratropium because there is a chance these could be contributing to her hot flashes. She is alert he had a CBC and a TSH done recently so won't repeat these. Labs we will do our as above. We could consider a CT of her torso or blood cultures if the above fails to reveal or resolve the issue. Next  Plan to see this patient back in about 4 weeks, earlier if needed. Any acute illnesses she'll go the emergency department.

## 2013-09-06 NOTE — Progress Notes (Unsigned)
Pt is here for an office visit. Complains of Rt side headaches with hot heat from bilateral hands and feet at night x3 months. Symptoms have been worse before. Has visited our clinic a few times for the same complaints. Also experiences hot/cold seats that interfere with pt's sleeping. Has been taking medication as prescribed and symptoms have not gone away. Pt also complains of sinuses; medication for sinuses did not work. Requests a stool test. Pt has an interpreter with her.

## 2013-09-07 LAB — FOLLICLE STIMULATING HORMONE: FSH: 3.9 m[IU]/mL

## 2013-09-07 LAB — HIV ANTIBODY (ROUTINE TESTING W REFLEX): HIV: NONREACTIVE

## 2013-09-08 ENCOUNTER — Other Ambulatory Visit: Payer: Self-pay | Admitting: Emergency Medicine

## 2013-09-08 ENCOUNTER — Other Ambulatory Visit: Payer: Self-pay | Admitting: *Deleted

## 2013-09-08 DIAGNOSIS — R8290 Unspecified abnormal findings in urine: Secondary | ICD-10-CM

## 2013-09-08 DIAGNOSIS — R61 Generalized hyperhidrosis: Secondary | ICD-10-CM

## 2013-09-09 ENCOUNTER — Other Ambulatory Visit: Payer: Self-pay | Admitting: Internal Medicine

## 2013-09-11 ENCOUNTER — Ambulatory Visit (HOSPITAL_COMMUNITY)
Admission: RE | Admit: 2013-09-11 | Discharge: 2013-09-11 | Disposition: A | Discharge status: Home / Self Care | Payer: PRIVATE HEALTH INSURANCE | Source: Ambulatory Visit | Attending: Family Medicine | Admitting: Family Medicine

## 2013-09-11 DIAGNOSIS — R61 Generalized hyperhidrosis: Secondary | ICD-10-CM | POA: Insufficient documentation

## 2013-09-16 LAB — CATECHOLAMINES, FRACTIONATED, URINE, 24 HOUR
CREATININE, URINE MG/DAY-CATEUR: 1.28 g/(24.h) (ref 0.63–2.50)
Calculated Total (E+NE): 68 mcg/24 h (ref 26–121)
Dopamine, 24 hr Urine: 279 mcg/24 h (ref 52–480)
Norepinephrine, 24 hr Ur: 68 mcg/24 h (ref 15–100)
TOTAL VOLUME - CF 24HR U: 3000 mL

## 2013-09-21 ENCOUNTER — Telehealth: Payer: Self-pay | Admitting: *Deleted

## 2013-09-21 NOTE — Telephone Encounter (Signed)
Contacted patient with an interpreter. Left a voicemail for patient to give us a call back. 

## 2013-09-21 NOTE — Telephone Encounter (Signed)
Message copied by Tong Pieczynski, [place]IA R on Fri Sep 21, 2013 11:06 AM ------      Message from: Susie CassetteABROL MD, [facility] Hospital[facility]YANA      Created: Mon Sep 17, 2013  1:05 PM       Patient 25 urine collection was normal ------

## 2013-10-02 ENCOUNTER — Encounter: Payer: Self-pay | Admitting: Internal Medicine

## 2013-10-02 ENCOUNTER — Ambulatory Visit: Payer: PRIVATE HEALTH INSURANCE | Attending: Internal Medicine | Admitting: Internal Medicine

## 2013-10-02 VITALS — BP 102/65 | HR 71 | Temp 98.6°F | Resp 14 | Ht 64.0 in | Wt 155.0 lb

## 2013-10-02 DIAGNOSIS — E119 Type 2 diabetes mellitus without complications: Secondary | ICD-10-CM | POA: Insufficient documentation

## 2013-10-02 DIAGNOSIS — R202 Paresthesia of skin: Secondary | ICD-10-CM

## 2013-10-02 DIAGNOSIS — R200 Anesthesia of skin: Secondary | ICD-10-CM

## 2013-10-02 DIAGNOSIS — R209 Unspecified disturbances of skin sensation: Secondary | ICD-10-CM

## 2013-10-02 NOTE — Progress Notes (Signed)
Patient is here for a follow up for a upper respiratory infection and night sweats. Complains of bilateral hands and feet being hot then cold, night sweats, headaches, heat from head and bilateral ears while sleeping, bilateral hand pain while cooking, aching pains from bilateral knees and feet while walking x4 months consistently; symptoms will come and go. Patient feels that symptoms may be linked to fibromyalgia. Patient has an Research officer, trade unionpanish interpreter.

## 2013-10-02 NOTE — Patient Instructions (Signed)
Neuropata perifrica (Peripheral Neuropathy) La neuropata perifrica es un tipo de lesin nerviosa. Afecta a los nervios que transportan seales entre la mdula espinal y otras partes del cuerpo. Estos se denominan nervios perifricos. Con la neuropata perifrica, es posible que haya lesiones en un nervio o en un grupo de nervios.  [place] lesiones en los nervios perifricos pueden ser [place]. En algunas personas se desconoce la causa de la neuropata perifrica. Algunas causas son:  Diabetes. Esta es la causa ms comn de la neuropata perifrica.  Lesin en un nervio.  Presin o tensin duraderas en un nervio.  [place] puede ser el alcoholismo.  Infecciones.  Enfermedades autoinmunitarias, como la esclerosis mltiple y el lupus eritematoso sistmico.  Enfermedades nerviosas hereditarias.  Algunos medicamentos, como los medicamentos para Science writer.  Sustancias txicas, como el plomo y Equality.  Flujo deficiente de [employer].  Enfermedades renales.  Enfermedad tiroidea. S[employer] Los sntomas que tenga dependern del tipo de nervio que se haya lesionado. Los sntomas ms comunes son:  Prdida de la sensibilidad (adormecimiento) en los pies y [place].  Hormigueo en los pies y las manos.  Dolor y ardor.  Piel muy sensible.  Debilidad.  Imposibilidad para mover partes del cuerpo (parlisis).  Fasciculaciones (temblores musculares).  Torpeza o coordinacin deficiente.  Prdida del equilibrio.  Dificultad para controlar la vejiga.  Sensacin de [employer].  Problemas sexuales. DIAGNSTICO  La neuropata perifrica es un sntoma, no una enfermedad. Puede ser difcil encontrar la causa de la neuropata perifrica. Para averiguarlo, el mdico le har una historia clnica y un examen fsico. Tambin se realizar un examen neurolgico. Esto involucra controlar  aspectos que puedan verse afectados por el cerebro, la mdula espinal y los nervios (sistema nervioso). Por ejemplo, el mdico controlar sus reflejos, cmo se mueve y su grado de sensibilidad.  Posiblemente le realicen otros tipos de pruebas, como las siguientes:  Anlisis de [place].  Una prueba del lquido de la mdula espinal.  Pruebas de diagnstico por imagen, como tomografas computarizadas (TC) o resonancias magnticas (RM).  Electromiograma (EMG). Esta prueba evala los nervios que controlan los msculos.  Pruebas de velocidad de conduccin nerviosa. Estas pruebas evalan la velocidad con la que los mensajes pasan a travs de los nervios.  Biopsia del nervio. Se extirpa un pedazo pequeo del nervio, que luego se examina con el microscopio. TRATAMIENTO   Con frecuencia, la neuropata perifrica se trata con medicamentos, entre los que se incluyen los siguientes:  Analgsicos. Se pueden sugerir medicamentos recetados o de [employer].  Medicamentos anticonvulsivos. Estos posiblemente se usen para Conservation officer, historic buildings.  Antidepresivos. Estos tambin pueden ayudar a Best boy provocado por la neuropata.  Lidocana. Este es un anestsico. Puede usar un parche o recibir una dosis.  Mexiletina. Este medicamento se [place] normalmente para ayudar a Chief Technology Officer los ritmos cardacos irregulares.  Ciruga. Es posible que la ciruga sea necesaria para Public house manager la presin en un nervio o para destruir un nervio que est provocando dolor.  La fisioterapia ayuda al movimiento.  Dispositivos de asistencia que ayuden al [employer]. Carrizo solo medicamentos de venta libre o recetados, segn las indicaciones del mdico. Siga cuidadosamente las instrucciones de Environmental manager. No tome ningn otro medicamento sin obtener primero la aprobacin del mdico.  Si tiene diabetes, trabaje estrechamente con el mdico para mantener bajo  control el nivel de Print production plannerazcar  en sangre.  Si siente adormecimiento en los pies, haga lo siguiente:  Realice controles todos los das para detectar signos de lesin o infeccin. Observe seales de enrojecimiento, calor e hinchazn.  Use calcetines acolchados y zapatos cmodos. Estos ayudan a Anadarko Petroleum Corporationproteger los pies.  No realice actividades que ejerzan presin sobre el nervio lesionado.  No fume. Fumar evita que la sangre llegue a los nervios lesionados.  Evite o limite el consumo alcohol. Demasiado alcohol puede provocar una falta de vitaminas B. Estas vitaminas son necesarias para tener nervios saludables.  Desarrolle un buen sistema de apoyo. Hacerle frente a la neuropata perifrica puede ser estresante. Hable con un profesional de la salud mental o nase a un grupo de apoyo si le cuesta enfrentar la situacin.  Concurra a las consultas de control con su mdico segn las indicaciones. SOLICITE ATENCIN MDICA SI:   Advierte signos o sntomas nuevos de la neuropata perifrica.  Le est costando lidiar con la neuropata perifrica desde el punto de vista emocional.  Tiene fiebre. SOLICITE ATENCIN MDICA DE INMEDIATO SI:   Tiene una lesin o infeccin que no se cura.  Se siente muy mareado o comienza a vomitar.  Siente dolor en el pecho.  Tiene dificultad para respirar. Document Released: 11/04/2008 Document Revised: 03/31/2011 [facility] CenterExit[facility] Patient Information 2014 [place], [place].

## 2013-10-02 NOTE — Progress Notes (Signed)
Patient ID: Kristen Holt, female   DOB: 09/14/1975, 38 y.o.   MRN: [id number]  CC: numbness and tingling  HPI:  Pt is 38 yo female who presents to clinic with main concern of several months duration of fatigue, restlessness, numbness and tingling in hands and legs, throbbing and constant right sided headaches, 5/10 in severity and associated with heat sensations even though she explains when she touches the skin it feels cold on the surface.  She also explains she feels extremity tired and wants to sleep all the time but she is in school and has 4 children and can not afford to sleep. Her symptoms are bothering her and she explains she is feeling more depressed about it as so far she has had so much testing done and she was told all tests are unremarkable. She denies weight gain or loss, no fevers, chills, no specific abdominal or urinary concerns.   No Known Allergies Past Medical History  Diagnosis Date  . Diabetes mellitus     pre diabetic   . Acute appendicitis 01/13/2012  . Thyroid disease    Current Outpatient Prescriptions on File Prior to Visit  Medication Sig Dispense Refill  . fluticasone (FLONASE) 50 MCG/ACT nasal spray Place 2 sprays into both nostrils daily.  16 g  2  . loratadine (CLARITIN) 10 MG tablet Take 1 tablet (10 mg total) by mouth daily.  30 tablet  2  . amoxicillin-clavulanate (AUGMENTIN) 875-125 MG per tablet Take 1 tablet by mouth 2 (two) times daily.  20 tablet  0  . castor oil liquid Take by mouth.      . levothyroxine (SYNTHROID, LEVOTHROID) 25 MCG tablet Take 25 mcg by mouth daily.      . predniSONE (DELTASONE) 10 MG tablet Take 3 tablets (30 mg total) by mouth daily.  15 tablet  0  . [DISCONTINUED] Ranitidine HCl (ZANTAC PO) Take by mouth.       No current facility-administered medications on file prior to visit.   Family History  Problem Relation Age of Onset  . Diabetes Mother    History   Social History  . Marital Status: Single    Spouse  Name: N/A    Number of Children: N/A  . Years of Education: N/A   Occupational History  . Not on file.   Social History Main Topics  . Smoking status: Never Smoker   . Smokeless tobacco: Never Used  . Alcohol Use: No  . Drug Use: No  . Sexual Activity: Yes   Other Topics Concern  . Not on file   Social History Narrative   ** Merged History Encounter **        Review of Systems  Per HPI  Objective:   Filed Vitals:   10/02/13 0901  BP: 102/65  Pulse: 71  Temp: 98.6 F (37 C)  Resp: 14    Physical Exam  Constitutional: Appears well-developed and well-nourished. No distress.  HENT: Normocephalic. External right and left ear normal. Oropharynx is clear and moist.  Eyes: Conjunctivae and EOM are normal. PERRLA, no scleral icterus.  Neck: Normal ROM. Neck supple. No JVD. No tracheal deviation. No thyromegaly.  CVS: RRR, S1/S2 +, no murmurs, no gallops, no carotid bruit.  Pulmonary: Effort and breath sounds normal, no stridor, rhonchi, wheezes, rales.  Abdominal: Soft. BS +,  no distension, tenderness, rebound or guarding.  Musculoskeletal: Normal range of motion. No edema and no tenderness.  Lymphadenopathy: No lymphadenopathy noted, cervical, inguinal. Neuro: Alert. Normal  reflexes, muscle tone coordination. No cranial nerve deficit. Skin: Skin is warm and dry. No rash noted. Not diaphoretic. No erythema. No pallor.  Psychiatric: Normal mood and affect. Behavior, judgment, thought content normal.   Lab Results  Component Value Date   WBC 7.4 08/17/2013   HGB 13.0 08/17/2013   HCT 38.0 08/17/2013   MCV 88.4 08/17/2013   PLT 329 08/17/2013   Lab Results  Component Value Date   CREATININE 0.50 08/17/2013   BUN 15 08/17/2013   NA 138 08/17/2013   K 4.1 08/17/2013   CL 102 08/17/2013   CO2 24 08/17/2013    No results found for this basename: HGBA1C   Lipid Panel  No results found for this basename: chol, trig, hdl, cholhdl, vldl, ldlcalc     Assessment and plan:    Patient Active Problem List   Diagnosis Date Noted  . Numbness and tingling - symptoms are rather systemic and certainly worrisome for an autoimmune process - will proceed with MRI brain w/wo contrast for further evaluation - may need neurology consultation but will see what the results of the MRI brain show - we discussed possibility of MS given the systemic symptoms, numbness and tingling, sensation of heat and cold, headaches, fatigue - we also discussed importance of dietary restrictions, fresh fruits and vegetables, fluids, resting, meditation, exercise - pt has verbalized understanding and had no further questions for me for today   - we have also reviewed existing blood work and diagnostic tests which essentially are all unremarkable and unrevealing of the etiology  08/21/2013

## 2013-10-04 ENCOUNTER — Ambulatory Visit (HOSPITAL_COMMUNITY)
Admission: RE | Admit: 2013-10-04 | Discharge: 2013-10-04 | Disposition: A | Discharge status: Home / Self Care | Payer: PRIVATE HEALTH INSURANCE | Source: Ambulatory Visit | Attending: Internal Medicine | Admitting: Internal Medicine

## 2013-10-04 ENCOUNTER — Ambulatory Visit: Payer: PRIVATE HEALTH INSURANCE

## 2013-10-04 DIAGNOSIS — R200 Anesthesia of skin: Secondary | ICD-10-CM

## 2013-10-04 DIAGNOSIS — R202 Paresthesia of skin: Secondary | ICD-10-CM

## 2013-10-04 DIAGNOSIS — R209 Unspecified disturbances of skin sensation: Secondary | ICD-10-CM | POA: Insufficient documentation

## 2013-10-04 MED ORDER — GADOBENATE DIMEGLUMINE 529 MG/ML IV SOLN
15.0000 mL | Freq: Once | INTRAVENOUS | Status: AC | PRN
  Administered 2013-10-04: 14 mL via INTRAVENOUS

## 2013-10-09 ENCOUNTER — Telehealth: Payer: Self-pay | Admitting: Emergency Medicine

## 2013-10-09 ENCOUNTER — Telehealth: Payer: Self-pay | Admitting: Internal Medicine

## 2013-10-09 NOTE — Telephone Encounter (Signed)
Pt needs MRI read. Please f/u ASAP!

## 2013-10-09 NOTE — Telephone Encounter (Signed)
Pt calling about MRI results. °

## 2013-10-09 NOTE — Telephone Encounter (Signed)
Contacted patient to return a telephone call about her MRI results. Spoke with Dr. Susie CassetteAbrol about the results. Notified the patient that her brain MRI was negative with sinusitis. Patient can schedule an appointment for clarification on her results due the ordering doctor being Dr. Izola PriceMyers. Patient's results will be routed to Dr. Izola PriceMyers for further instructions.

## 2013-10-10 ENCOUNTER — Encounter: Payer: Self-pay | Admitting: Internal Medicine

## 2013-10-10 ENCOUNTER — Ambulatory Visit: Payer: No Typology Code available for payment source | Attending: Internal Medicine | Admitting: Internal Medicine

## 2013-10-10 VITALS — BP 112/72 | HR 81 | Temp 98.8°F | Resp 16

## 2013-10-10 DIAGNOSIS — Z09 Encounter for follow-up examination after completed treatment for conditions other than malignant neoplasm: Secondary | ICD-10-CM | POA: Insufficient documentation

## 2013-10-10 DIAGNOSIS — R209 Unspecified disturbances of skin sensation: Secondary | ICD-10-CM

## 2013-10-10 DIAGNOSIS — J069 Acute upper respiratory infection, unspecified: Secondary | ICD-10-CM | POA: Insufficient documentation

## 2013-10-10 DIAGNOSIS — R200 Anesthesia of skin: Secondary | ICD-10-CM

## 2013-10-10 MED ORDER — AZITHROMYCIN 250 MG PO TABS: ORAL_TABLET | ORAL | Status: DC

## 2013-10-10 NOTE — Progress Notes (Signed)
Patient states got the results of her MRI Was negative but was told she has a sinus infection

## 2013-10-10 NOTE — Progress Notes (Signed)
Patient ID: Kristen Holt, female   DOB: 09/14/1975, 38 y.o.   MRN: [id number]   CC: follow up   HPI: Pt is 38 yo female who comes in for follow up. She wants to know the results of MRI. She explains she has noticed more drainage from nose and generalized headaches, post nasal drip. She also reports subjective fevers, chills. No other systemic concerns. She still has numbness and tingling on the right side of the body unchanged over the past month.   No Known Allergies Past Medical History  Diagnosis Date  . Diabetes mellitus     pre diabetic   . Acute appendicitis 01/13/2012  . Thyroid disease    Current Outpatient Prescriptions on File Prior to Visit  Medication Sig Dispense Refill  . castor oil liquid Take by mouth.      . fluticasone (FLONASE) 50 MCG/ACT nasal spray Place 2 sprays into both nostrils daily.  16 g  2  . levothyroxine (SYNTHROID, LEVOTHROID) 25 MCG tablet Take 25 mcg by mouth daily.      Marland Kitchen. loratadine (CLARITIN) 10 MG tablet Take 1 tablet (10 mg total) by mouth daily.  30 tablet  2  . predniSONE (DELTASONE) 10 MG tablet Take 3 tablets (30 mg total) by mouth daily.  15 tablet  0  . [DISCONTINUED] Ranitidine HCl (ZANTAC PO) Take by mouth.       No current facility-administered medications on file prior to visit.   Family History  Problem Relation Age of Onset  . Diabetes Mother    History   Social History  . Marital Status: Single    Spouse Name: N/A    Number of Children: N/A  . Years of Education: N/A   Occupational History  . Not on file.   Social History Main Topics  . Smoking status: Never Smoker   . Smokeless tobacco: Never Used  . Alcohol Use: No  . Drug Use: No  . Sexual Activity: Yes   Other Topics Concern  . Not on file   Social History Narrative   ** Merged History Encounter **        Review of Systems  Constitutional: Negative for activity change, appetite change and fatigue.  HENT: Negative for ear pain, nosebleeds, neck  pain, neck stiffness and ear discharge.   Eyes: Negative for pain, discharge, redness, itching and visual disturbance.  Respiratory: Negative for cough, choking, chest tightness, shortness of breath, wheezing and stridor.   Cardiovascular: Negative for chest pain, palpitations and leg swelling.  Gastrointestinal: Negative for abdominal distention.  Genitourinary: Negative for dysuria, urgency, frequency, hematuria, flank pain, decreased urine volume, difficulty urinating and dyspareunia.  Musculoskeletal: Negative for back pain, joint swelling, arthralgias and gait problem.  Neurological: Negative for dizziness, tremors, seizures, syncope, facial asymmetry.  Hematological: Negative for adenopathy. Does not bruise/bleed easily.  Psychiatric/Behavioral: Negative for hallucinations, behavioral problems, confusion, dysphoric mood, decreased concentration and agitation.    Objective:   Filed Vitals:   10/10/13 1059  BP: 112/72  Pulse: 81  Temp: 98.8 F (37.1 C)  Resp: 16    Physical Exam  Constitutional: Appears well-developed and well-nourished. No distress.  HENT: Normocephalic. External right and left ear normal. Oropharynx is clear and moist. Nasal discharge and sinus area TTP  Eyes: Conjunctivae and EOM are normal. PERRLA, no scleral icterus.  Neck: Normal ROM. Neck supple. No JVD. No tracheal deviation. No thyromegaly.  CVS: RRR, S1/S2 +, no murmurs, no gallops, no carotid bruit.  Pulmonary: Effort and  breath sounds normal, no stridor, rhonchi, wheezes, rales.  Abdominal: Soft. BS +,  no distension, tenderness, rebound or guarding.  Musculoskeletal: Normal range of motion. No edema and no tenderness.  Lymphadenopathy: No lymphadenopathy noted, cervical, inguinal. Neuro: Alert. Normal reflexes, muscle tone coordination. No cranial nerve deficit. Skin: Skin is warm and dry. No rash noted. Not diaphoretic. No erythema. No pallor.  Psychiatric: Normal mood and affect. Behavior,  judgment, thought content normal.   Lab Results  Component Value Date   WBC 7.4 08/17/2013   HGB 13.0 08/17/2013   HCT 38.0 08/17/2013   MCV 88.4 08/17/2013   PLT 329 08/17/2013   Lab Results  Component Value Date   CREATININE 0.50 08/17/2013   BUN 15 08/17/2013   NA 138 08/17/2013   K 4.1 08/17/2013   CL 102 08/17/2013   CO2 24 08/17/2013    No results found for this basename: HGBA1C   Lipid Panel  No results found for this basename: chol, trig, hdl, cholhdl, vldl, ldlcalc       Assessment and plan:   Patient Active Problem List   Diagnosis Date Noted  . Upper respiratory infection, acute vs sinus infection - will treat with course of ABX Zithromax, discussed importance of hand washing and preventing sick contacts or exposures 05/21/2013  Persistent numbness and tingling - MRI with no signs of demyelinating disease, will provide referral to neurologist

## 2013-10-10 NOTE — Patient Instructions (Signed)
Sinusitis   (Sinusitis)  La sinusitis es el enrojecimiento, sensibilidad e hinchazón (inflamación) de los senos paranasales. Los senos paranasales son bolsas de aire que se encuentran dentro de los huesos de la cara (por debajo de los ojos, en la mitad de la frente o por encima de los ojos). En los senos paranasales sanos, el moco puede drenar y el aire circula a través de ellos en su camino hacia la nariz. Sin embargo, cuando se inflaman, el moco y el aire quedan atrapados. Esto hace que se desarrollen bacterias y otros gérmenes y originen una infección.    La sinusitis puede desarrollarse rápidamente y durar sólo un tiempo corto (aguda) o continuar por un período largo (crónica). La sinusitis que dura más de 12 semanas se considera crónica.   CAUSAS   Las causas de la sinusitis son:   · Alergias  · Las anomalías estructurales, como el desplazamiento del cartílago que separa las fosas nasales (desvío del tabique) pueden disminuir el flujo de aire por la nariz y los senos paranasales y afectar su drenaje.  · Las alteraciones funcionales, como cuando los pequeños pelos (cilias) que se encuentran en los senos nasales y que ayudan a eliminar la mucosidad no funcionan correctamente o no están presentes.  SÍNTOMAS   Los síntomas de sinusitis aguda y crónica son los mismos. Los síntomas principales son el dolor y la presión alrededor de los senos paranasales afectados. Otros síntomas son:   · Dolor en los dientes superiores.  · Dolor de oídos.  · Dolor de cabeza.  · Mal aliento.  · Disminución del sentido del olfato y del gusto.  · Tos, que empeora al acostarse.  · Fatiga.  · Fiebre.  · Drenaje de moco espeso por la nariz, que generalmente es de color verde y puede contener pus (purulento).  · Hinchazón y calor en los senos paranasales afectados.  DIAGNÓSTICO   El médico le hará un examen físico. Durante el examen, el médico:   · Revisará su nariz buscando signos de crecimientos anormales en las fosas nasales (pólipos  nasales).  · Palpará los senos paranasales afectados para buscar signos de infección.  · Observará el interior de los senos paranasales (endoscopía) con un dispositivo especial que emite luz (endoscopio) colocándolo dentro de los senos paranasales.  Si el médico sospecha que usted sufre sinusitis crónica, podrá indicar una o más de las siguientes pruebas:   · Pruebas de alergia.  · Cultivo de secreciones nasales: tomará una muestra del moco nasal y la enviará a un laboratorio para detectar bacterias.  · Citología nasal: el médico tomará una muestra de moco de la nariz para determinar si la sinusitis que usted sufre está relacionada con una alergia.  TRATAMIENTO   La mayoría de los casos de sinusitis aguda se deben a una infección viral y se resuelven espontáneamente dentro de los 10 días. En algunos casos se recetan medicamentos para aliviar los síntomas (analgésicos, descongestivos, aerosoles nasales con corticoides o aerosoles salinos).   Sin embargo, para la sinusitis por infección bacteriana, el médico le recetará antibióticos. Los antibióticos son medicamentos que destruyen las bacterias que causan la infección.   Rara vez la sinusitis tiene su origen en una infección por hongos. En estos casos, el médico le recetará un medicamento antifúngico.   Para algunos casos de sinusitis crónica, es necesario someterse a una cirugía. Generalmente se trata de casos en los que la sinusitis se repite más de 3 veces al año, a pesar de otros tratamientos.     INSTRUCCIONES PARA EL CUIDADO EN EL HOGAR   · Tiene que beber gran cantidad de agua. Los líquidos ayudan a disolver el moco para que drene más fácilmente de los senos paranasales.  · Use un humidificador.  · Inhale vapor de 3 a 4 veces al día (por ejemplo, siéntese en el baño con la ducha abierta).  · Aplique un paño tibio y húmedo en su cara 3 ó 4 veces al día, o según las indicaciones de su médico.  · Use un aerosol nasal salino para ayudar a humedecer y limpiar los  senos nasales.  · Tome medicamentos de venta libre o recetados para el aliviar el dolor, el malestar o la fiebre sólo según las indicaciones de su médico.  SOLICITE ATENCIÓN MÉDICA DE INMEDIATO SI:   · Siente más dolor o sufre dolores de cabeza intensos.  · Tiene náuseas, vómitos o somnolencia.  · Observa hinchazón alrededor del rostro.  · Tiene problemas de visión.  · Presenta rigidez en el cuello.  · Tiene dificultad para respirar.  ASEGÚRESE DE QUE:   · Comprende estas instrucciones.  · Controlará su enfermedad.  · Recibirá ayuda de inmediato si no mejora o si empeora.  Document Released: 04/28/2005 Document Revised: 03/21/2013  [employer]® Patient Information ©2014 [employer].

## 2013-11-06 ENCOUNTER — Ambulatory Visit: Payer: No Typology Code available for payment source | Attending: Internal Medicine

## 2013-11-13 ENCOUNTER — Ambulatory Visit: Payer: No Typology Code available for payment source

## 2013-11-19 ENCOUNTER — Ambulatory Visit: Payer: PRIVATE HEALTH INSURANCE | Admitting: Internal Medicine

## 2013-12-06 ENCOUNTER — Other Ambulatory Visit: Payer: Self-pay | Admitting: Neurology

## 2013-12-06 ENCOUNTER — Ambulatory Visit (INDEPENDENT_AMBULATORY_CARE_PROVIDER_SITE_OTHER): Payer: Self-pay | Admitting: Neurology

## 2013-12-06 ENCOUNTER — Encounter: Payer: Self-pay | Admitting: Neurology

## 2013-12-06 VITALS — BP 128/74 | HR 66 | Temp 97.4°F | Resp 16 | Ht 62.0 in | Wt 152.5 lb

## 2013-12-06 DIAGNOSIS — M79643 Pain in unspecified hand: Secondary | ICD-10-CM

## 2013-12-06 DIAGNOSIS — R208 Other disturbances of skin sensation: Secondary | ICD-10-CM

## 2013-12-06 DIAGNOSIS — R209 Unspecified disturbances of skin sensation: Secondary | ICD-10-CM

## 2013-12-06 DIAGNOSIS — M79609 Pain in unspecified limb: Secondary | ICD-10-CM

## 2013-12-06 DIAGNOSIS — M79671 Pain in right foot: Secondary | ICD-10-CM

## 2013-12-06 DIAGNOSIS — M79672 Pain in left foot: Secondary | ICD-10-CM

## 2013-12-06 LAB — FOLATE

## 2013-12-06 NOTE — Patient Instructions (Signed)
1.  We will check blood work looking for causes of nerve problems. 2.  We will schedule an EMG test to test the nerves. 3.  Follow up after emg.

## 2013-12-06 NOTE — Progress Notes (Signed)
NEUROLOGY CONSULTATION NOTE  Arnold Depinto MRN: [id number] DOB: 09/14/1975  Referring provider: Dr. Doyle Askew Primary care provider: Dr. Doreene Burke  Reason for consult:  Numbness  HISTORY OF PRESENT ILLNESS: Kristen Holt is a 38 year old right-handed Spanish-speaking woman with history of thyroid disease and pre-diabetes who presents for numbness and tingling.  Records and images were personally reviewed where available.  There is an interpreter present.  For several months, she has experienced multiple systemic symptoms, including night sweats, fatigue.  She reports waking up several times a night with sweats and hot flashes.  She reports no fever associated with these episodes, as she would check her temperature.  She was experiencing cold like symptoms. She also has been experiencing upper respiratory symptoms as well.    Among the symptoms include a sensation of feeling hot in the right side of her head, palms of her hands, and soles of her feet. This started approximately 3 months ago. It's not really a burning sensation. She notes discomfort and has to uncover her feet when she is laying in bed. Initially, she didn't go out much for a couple of months because her feet would feel inflamed when she would walk. Also she felt like she had no strength in her legs and had pain in the knees. She would have difficulty cooking, because her hands felt hot and with discomfort.  She noted dull bi-frontal non-throbbing headache. No associated rash.  Over time, symptoms have almost resolved. However she still notes some sensation of heat in her palms and feet. She also continues to have upper respiratory symptoms. She has no prior history of similar symptoms.  10/04/13 MRI Brain w/wo:  brain normal but has sinus disease. Labs in January and February 2015 were unremarkable, including HIV, ESR 7, ANA, TSH 3.526.Marland Kitchen  PAST MEDICAL HISTORY: Past Medical History  Diagnosis Date  . Diabetes  mellitus     pre diabetic   . Acute appendicitis 01/13/2012  . Thyroid disease     PAST SURGICAL HISTORY: Past Surgical History  Procedure Laterality Date  . Laparoscopic appendectomy  01/18/2012    Procedure: APPENDECTOMY LAPAROSCOPIC;  Surgeon: Haywood Lasso, MD;  Location: [facility];  Service: General;  Laterality: N/A;  . Appendectomy      MEDICATIONS: Current Outpatient Prescriptions on File Prior to Visit  Medication Sig Dispense Refill  . fluticasone (FLONASE) 50 MCG/ACT nasal spray Place 2 sprays into both nostrils daily.  16 g  2  . loratadine (CLARITIN) 10 MG tablet Take 1 tablet (10 mg total) by mouth daily.  30 tablet  2  . predniSONE (DELTASONE) 10 MG tablet Take 3 tablets (30 mg total) by mouth daily.  15 tablet  0  . azithromycin (ZITHROMAX) 250 MG tablet Take 500 mg tablet today and continue taking 250 mg tablet daily until completed  6 tablet  0  . castor oil liquid Take by mouth.      . levothyroxine (SYNTHROID, LEVOTHROID) 25 MCG tablet Take 25 mcg by mouth daily.      . [DISCONTINUED] Ranitidine HCl (ZANTAC PO) Take by mouth.       No current facility-administered medications on file prior to visit.    ALLERGIES: No Known Allergies  FAMILY HISTORY: Family History  Problem Relation Age of Onset  . Diabetes Mother     SOCIAL HISTORY: History   Social History  . Marital Status: Single    Spouse Name: N/A    Number of Children: N/A  .  Years of Education: N/A   Occupational History  . Not on file.   Social History Main Topics  . Smoking status: Never Smoker   . Smokeless tobacco: Never Used  . Alcohol Use: No  . Drug Use: No  . Sexual Activity: Yes    Partners: Male   Other Topics Concern  . Not on file   Social History Narrative   ** Merged History Encounter **        REVIEW OF SYSTEMS: Constitutional: Sweats Eyes: No visual changes, double vision, eye pain Ear, nose and throat: Nasal congestion Cardiovascular: No chest pain,  palpitations Respiratory:  No shortness of breath at rest or with exertion, wheezes GastrointestinaI: No nausea, vomiting, diarrhea, abdominal pain, fecal incontinence Genitourinary:  No dysuria, urinary retention or frequency Musculoskeletal:  No neck pain, back pain Integumentary: No rash, pruritus, skin lesions Neurological: as above Psychiatric: No depression, insomnia, anxiety Endocrine: No palpitations, fatigue, diaphoresis, mood swings, change in appetite, change in weight, increased thirst Hematologic/Lymphatic:  No anemia, purpura, petechiae. Allergic/Immunologic: no itchy/runny eyes, recent allergic reactions, rashes  PHYSICAL EXAM: Filed Vitals:   12/06/13 0917  BP: 128/74  Pulse: 66  Temp: 97.4 F (36.3 C)  Resp: 16   General: No acute distress Head:  Normocephalic/atraumatic Neck: supple, no paraspinal tenderness, full range of motion Back: No paraspinal tenderness Heart: regular rate and rhythm Lungs: Clear to auscultation bilaterally. Vascular: No carotid bruits. Neurological Exam: Mental status: alert and oriented to person, place, and time, recent and remote memory intact, fund of knowledge intact, attention and concentration intact, speech fluent and not dysarthric, language intact. Cranial nerves: CN I: not tested CN II: pupils equal, round and reactive to light, visual fields intact, fundi unremarkable, without vessel changes, exudates, hemorrhages or papilledema. CN III, IV, VI:  full range of motion, no nystagmus, no ptosis CN V: facial sensation intact CN VII: upper and lower face symmetric CN VIII: hearing intact CN IX, X: gag intact, uvula midline CN XI: sternocleidomastoid and trapezius muscles intact CN XII: tongue midline Bulk & Tone: normal, no fasciculations. Motor: 5 out of 5 throughout Sensation: Pinprick and vibration intact Deep Tendon Reflexes: 2+ throughout, toes downgoing Finger to nose testing: No Heel to shin: No dysmetria Gait:  Normal station and stride. Able to turn and walk in tandem. Romberg negative.  IMPRESSION: Dysesthesias.    PLAN: 1.  Check blood for causes of neuropathy:  B12, B6, folate, SPEP/IFE 2.  NCV/EMG 3.  Follow up after NCV  45 minutes spent with patient, over 50% spent reviewing MRI, counseling and coordinating care.  Thank you for allowing me to take part in the care of this patient.  Metta Clines, DO  CC:  Mart Piggs, MD  Angelica Chessman, MD

## 2013-12-07 LAB — VITAMIN B12: Vitamin B-12: 850 pg/mL (ref 211–911)

## 2013-12-10 LAB — SPEP & IFE WITH QIG
ALBUMIN ELP: 58.4 % (ref 55.8–66.1)
Alpha-1-Globulin: 3.3 % (ref 2.9–4.9)
Alpha-2-Globulin: 9.1 % (ref 7.1–11.8)
Beta 2: 4.8 % (ref 3.2–6.5)
Beta Globulin: 6.3 % (ref 4.7–7.2)
Gamma Globulin: 18.1 % (ref 11.1–18.8)
IGA: 236 mg/dL (ref 69–380)
IgG (Immunoglobin G), Serum: 1340 mg/dL (ref 690–1700)
IgM, Serum: 185 mg/dL (ref 52–322)
TOTAL PROTEIN, SERUM ELECTROPHOR: 7.3 g/dL (ref 6.0–8.3)

## 2013-12-10 LAB — VITAMIN B6: Vitamin B6: 26.7 ng/mL — ABNORMAL HIGH (ref 2.1–21.7)

## 2013-12-12 ENCOUNTER — Telehealth: Payer: Self-pay | Admitting: *Deleted

## 2013-12-12 ENCOUNTER — Telehealth: Payer: Self-pay | Admitting: Neurology

## 2013-12-12 NOTE — Telephone Encounter (Signed)
Patient is aware of Lab results 

## 2013-12-12 NOTE — Telephone Encounter (Signed)
Have you seen lab results on this patient ? Please advise

## 2013-12-12 NOTE — Telephone Encounter (Signed)
Pt is requesting the results of her lab work.

## 2013-12-13 NOTE — Telephone Encounter (Signed)
Patient is aware that labs are normal  

## 2013-12-13 NOTE — Telephone Encounter (Signed)
I already had sent a message stating that the labs were normal.

## 2014-01-16 ENCOUNTER — Encounter: Payer: Self-pay | Admitting: Neurology

## 2014-01-17 ENCOUNTER — Other Ambulatory Visit: Payer: Self-pay | Admitting: *Deleted

## 2014-01-17 ENCOUNTER — Encounter: Payer: Self-pay | Admitting: Neurology

## 2014-01-17 ENCOUNTER — Telehealth: Payer: Self-pay | Admitting: Neurology

## 2014-01-17 ENCOUNTER — Ambulatory Visit (INDEPENDENT_AMBULATORY_CARE_PROVIDER_SITE_OTHER): Payer: Self-pay | Admitting: Neurology

## 2014-01-17 DIAGNOSIS — G609 Hereditary and idiopathic neuropathy, unspecified: Secondary | ICD-10-CM

## 2014-01-17 NOTE — Procedures (Signed)
[facility]  [address], [address]  [place], [place] [address] Tel: [phone] Fax:  [phone] Test Date:  01/17/2014  Patient: Kristen Holt DOB: 09/14/1975 Physician: Nita Sickleonika Patel, DO  Sex: Female Height: 5\' 2"  Ref Phys: Shon MilletJaffe, Adam  ID#: [id number] Temp: 33.0C Technician: Ala BentSusan Reid R. NCS T.   Patient Complaints: Patient is a 38 year old female with bilateral hand and foot paresthesias and weakness to be evaluated for neuropathy.  NCV & EMG Findings: Extensive electrodiagnostic testing of the right upper and lower extremity reveals:  1. Median, ulnar, radial, and palmar studies are normal.  2. Median and ulnar motor studies are within normal limits.  3. Sural, superficial peroneal, and medial plantar sensory responses are normal.  4. Tibial and peroneal motor responses are within normal limits.  5. There is no evidence of active or chronic motor axon loss changes affecting any of the tested muscles.  Impression: This is a normal study of the right upper and lower extremities.  In particular, there is no evidence of a generalized sensorimotor polyneuropathy, carpal tunnel syndrome, or cervical/lumbosacral motor radiculopathy affecting the right side.   ___________________________ Nita Sickleonika Patel, DO    Nerve Conduction Studies Anti Sensory Summary Table   Site NR Peak (ms) Norm Peak (ms) P-T Amp (V) Norm P-T Amp  Right Median Anti Sensory (2nd Digit)  33C  Wrist    2.7 <3.4 78.1 >20  Right Radial Anti Sensory (Base 1st Digit)  33C  Wrist    2.1 <2.7 58.0 >18  Right Sup Peroneal Anti Sensory (Ant Lat Mall)  12 cm    2.7 <4.5 33.4 >5  Right Sural Anti Sensory (Lat Mall)  Calf    3.4 <4.5 33.4 >5  Right Ulnar Anti Sensory (5th Digit)  33C  Wrist    2.4 <3.1 46.2 >12   Motor Summary Table   Site NR Onset (ms) Norm Onset (ms) O-P Amp (mV) Norm O-P Amp Site1 Site2 Delta-0 (ms) Dist (cm) Vel (m/s) Norm Vel (m/s)  Right Median Motor (Abd  Poll Brev)  33C  Wrist    2.4 <3.9 9.6 >6 Elbow Wrist 3.7 21.0 57 >50  Elbow    6.1  9.2         Right Peroneal Motor (Ext Dig Brev)  Ankle    2.9 <5.5 6.0 >3 B Fib Ankle 5.8 31.0 53 >40  B Fib    8.7  5.5  Poplt B Fib 1.9 10.0 53 >40  Poplt    10.6  5.3         Right Tibial Motor (Abd Hall Brev)  Ankle    3.8 <6.0 24.9 >8 Knee Ankle 7.1 39.5 56 >40  Knee    10.9  20.8         Right Ulnar Motor (Abd Dig Minimi)  33C  Wrist    1.9 <3.1 9.4 >7 B Elbow Wrist 3.3 22.5 68 >50  B Elbow    5.2  9.4  A Elbow B Elbow 1.4 10.0 71 >50  A Elbow    6.6  9.3          Comparison Summary Table   Site NR Peak (ms) Norm Peak (ms) P-T Amp (V) Site1 Site2 Delta-P (ms) Norm Delta (ms)  Right Median/Ulnar Palm Comparison (Wrist - 8cm)  33C  Median Palm    1.8 <2.2 44.7 Median Palm Ulnar Palm 0.1   Ulnar Palm    1.9 <2.2 21.2  Mixed Summary Table   Site NR Peak (ms) Norm Peak (ms) P-T Amp (V) Norm P-T Amp  Right Medial Plantar Mixed (Med Malleolus)  Medial Foot    2.0 <3.7 18.3 >8   F Wave Studies   NR F-Lat (ms) Lat Norm (ms) L-R F-Lat (ms)  Right Tibial (Mrkrs) (Abd Hallucis)     44.43 <55    EMG   Side Muscle Ins Act Fibs Psw Fasc Number Recrt Dur Dur. Amp Amp. Poly Poly. Comment  Right AntTibialis Nml Nml Nml Nml Nml Nml Nml Nml Nml Nml Nml Nml N/A  Right Gastroc Nml Nml Nml Nml Nml Nml Nml Nml Nml Nml Nml Nml N/A  Right Flex Dig Long Nml Nml Nml Nml Nml Nml Nml Nml Nml Nml Nml Nml N/A  Right RectFemoris Nml Nml Nml Nml Nml Nml Nml Nml Nml Nml Nml Nml N/A  Right GluteusMed Nml Nml Nml Nml Nml Nml Nml Nml Nml Nml Nml Nml N/A  Right 1stDorInt Nml Nml Nml Nml Nml Nml Nml Nml Nml Nml Nml Nml N/A  Right FlexPolLong Nml Nml Nml Nml Nml Nml Nml Nml Nml Nml Nml Nml N/A  Right Ext Indicis Nml Nml Nml Nml Nml Nml Nml Nml Nml Nml Nml Nml N/A  Right PronatorTeres Nml Nml Nml Nml Nml Nml Nml Nml Nml Nml Nml Nml N/A  Right Triceps Nml Nml Nml Nml Nml Nml Nml Nml Nml Nml Nml Nml N/A  Right  Deltoid Nml Nml Nml Nml Nml Nml Nml Nml Nml Nml Nml Nml N/A      Waveforms:

## 2014-01-17 NOTE — Telephone Encounter (Signed)
Pt no showed 01/16/14 appt for EEG study. No show letter mailed to ptp / Sherri

## 2014-01-31 ENCOUNTER — Ambulatory Visit: Payer: Self-pay | Admitting: Neurology

## 2014-02-07 ENCOUNTER — Telehealth: Payer: Self-pay | Admitting: Neurology

## 2014-02-07 NOTE — Telephone Encounter (Signed)
Pt no showed 01/31/14 follow up appt w/ Dr. Everlena CooperJaffe. No show letter mailed to pt / Sherri S.

## 2014-02-18 ENCOUNTER — Ambulatory Visit: Payer: Self-pay | Admitting: Internal Medicine

## 2014-05-27 ENCOUNTER — Ambulatory Visit: Payer: Self-pay | Admitting: Internal Medicine

## 2014-05-31 ENCOUNTER — Ambulatory Visit: Payer: Self-pay

## 2014-06-19 ENCOUNTER — Ambulatory Visit: Payer: Self-pay

## 2014-08-14 ENCOUNTER — Ambulatory Visit: Payer: Self-pay

## 2014-08-15 ENCOUNTER — Ambulatory Visit: Payer: Self-pay | Attending: Internal Medicine

## 2014-10-11 ENCOUNTER — Ambulatory Visit: Payer: Self-pay | Admitting: Internal Medicine

## 2014-10-27 IMAGING — CR DG CHEST 2V
2 series · 2 of 2 positions shown · non-contrast
Comparison: May 28, 2013

CLINICAL DATA: Night sweats

EXAM:
CHEST  2 VIEW

[w chest pa]
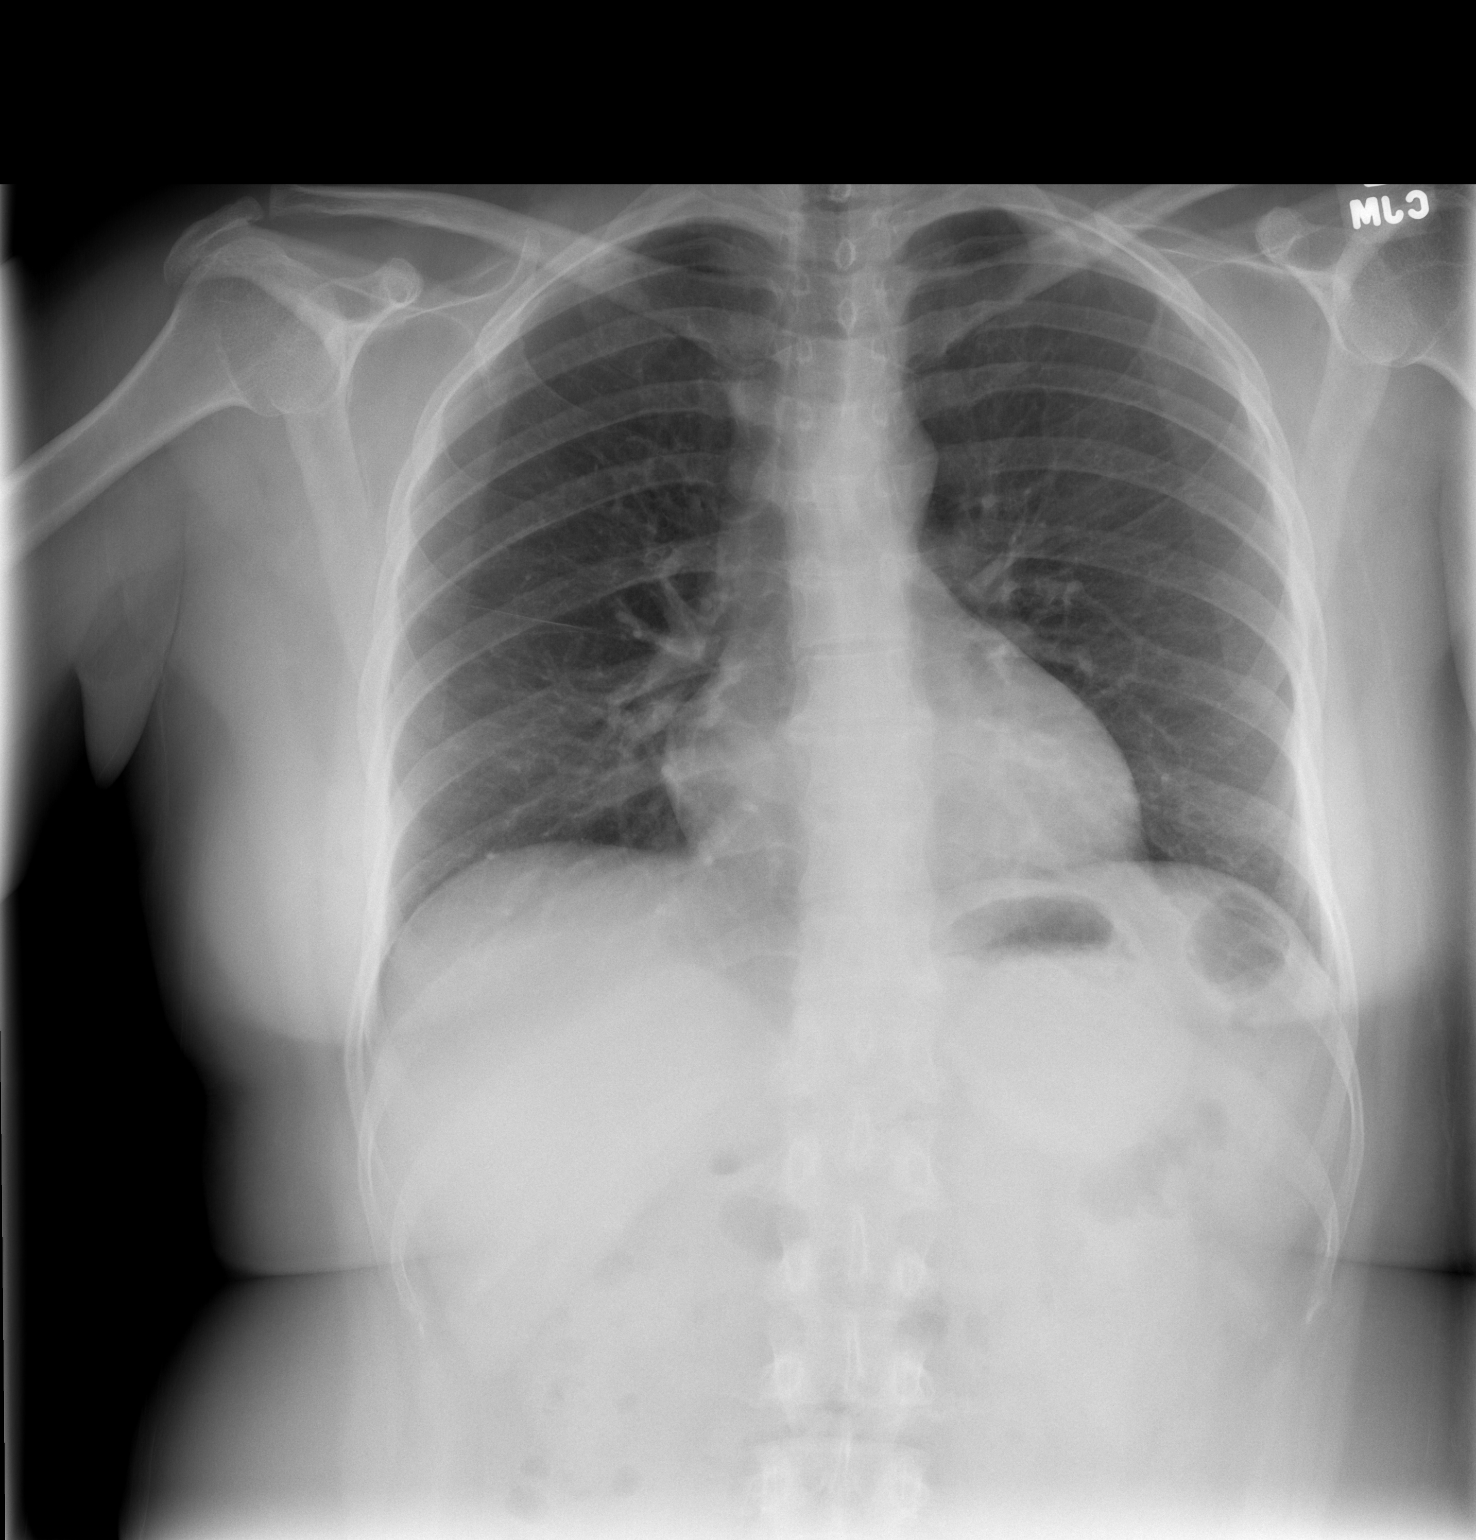

[w chest lat]
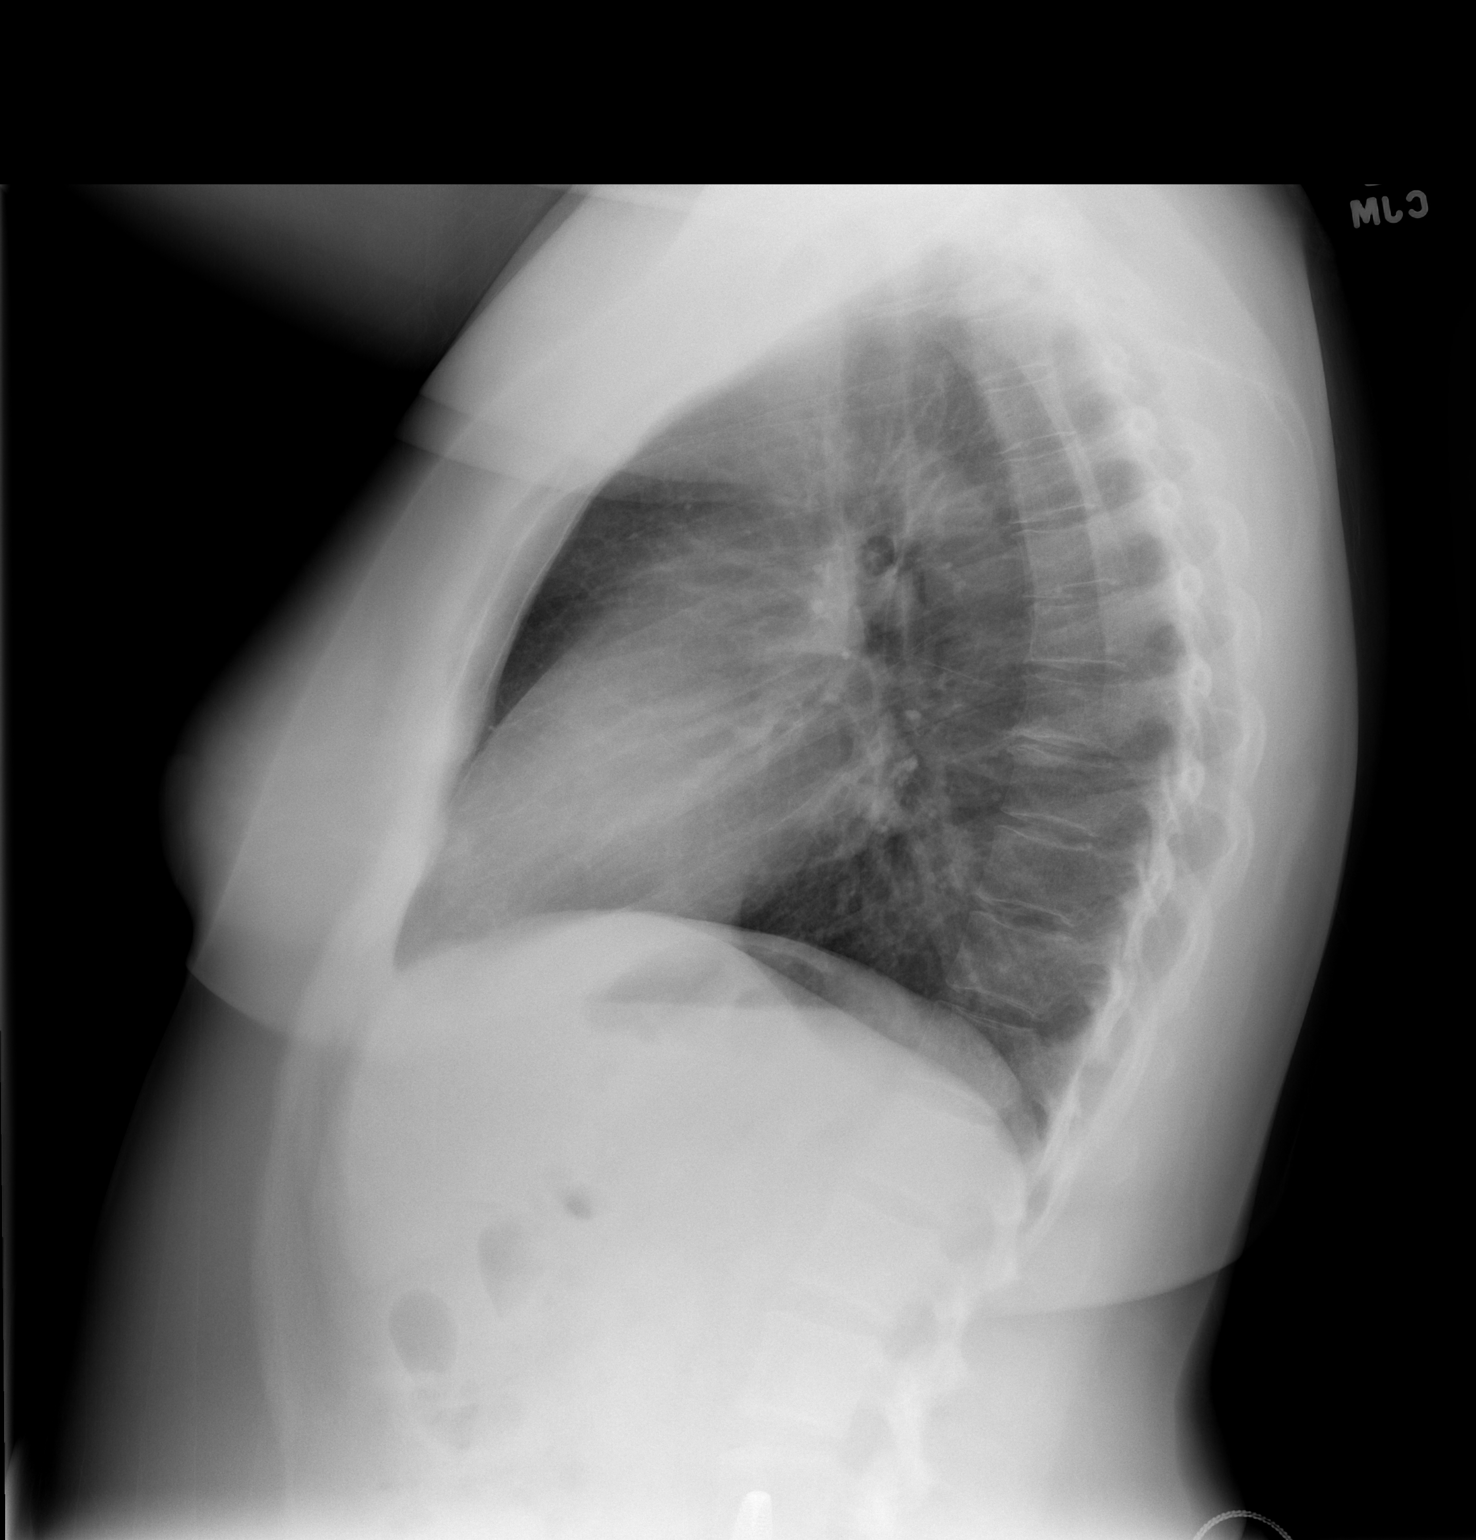

[2 of 2 positions shown; findings below may reference images not displayed]

FINDINGS: Lungs are clear. Heart size and pulmonary vascularity are normal. No
adenopathy. No bone lesions.
IMPRESSION: No abnormality noted.

## 2015-08-26 ENCOUNTER — Emergency Department (HOSPITAL_COMMUNITY)
Admission: EM | Admit: 2015-08-26 | Discharge: 2015-08-26 | Disposition: A | Discharge status: Home / Self Care | Payer: Self-pay | Source: Home / Self Care | Attending: Emergency Medicine | Admitting: Emergency Medicine

## 2015-08-26 ENCOUNTER — Encounter (HOSPITAL_COMMUNITY): Payer: Self-pay | Admitting: *Deleted

## 2015-08-26 DIAGNOSIS — H9203 Otalgia, bilateral: Secondary | ICD-10-CM

## 2015-08-26 DIAGNOSIS — H6593 Unspecified nonsuppurative otitis media, bilateral: Secondary | ICD-10-CM

## 2015-08-26 MED ORDER — AMOXICILLIN 500 MG PO CAPS: 500.0000 mg | ORAL_CAPSULE | Freq: Two times a day (BID) | ORAL | Status: AC

## 2015-08-26 NOTE — Discharge Instructions (Signed)
Otitis media - Adultos (Otitis Media, Adult) La otitis media es el enrojecimiento, el dolor y la inflamacin del odo [place]. La causa de la otitis media puede ser [place] Raring o, ms frecuentemente, una infeccin. Muchas veces ocurre como una complicacin de un resfro comn. SIGNOS Y SNTOMAS Los sntomas de la otitis media son:  Dolor de odos.  Grant Ruts.  Zumbidos en el odo.  Dolor de [place].  Prdida de lquido por el odo. DIAGNSTICO Para diagnosticar la otitis media, el mdico examinar su odo con un otoscopio. Este instrumento le permite al mdico observar el interior del odo y examinar el tmpano. El mdico le preguntar acerca de sus sntomas. TRATAMIENTO  Generalmente la otitis media mejora sin tratamiento entre 3 y los [address]. El mdico podr recetar algunos medicamentos para Primary school teacher. Si la otitis media no mejora dentro de los [address]  [address] o es recurrente, el mdico puede prescribir antibiticos si sospecha que la causa es una infeccin bacteriana. INSTRUCCIONES PARA EL CUIDADO EN EL HOGAR   Si le recetaron antibiticos, asegrese de terminarlos, incluso si comienza a sentirse mejor.  Tome los medicamentos solamente como se lo haya indicado el mdico.  Concurra a todas las visitas de control como se lo haya indicado el mdico. SOLICITE ATENCIN MDICA SI:  Tiene otitis media solo en un odo o sangra por la nariz, o ambas cosas.  Advierte un bulto en el cuello.  No mejora luego de 3 a 5 das.  Empeora en lugar de mejorar. SOLICITE ATENCIN MDICA DE INMEDIATO SI:   Aumenta el dolor y no puede controlarlo con Tourist information centre manager.  Observa hinchazn, irritacin o dolor alrededor del odo o rigidez en el cuello.  Nota que una parte de su rostro est paralizada.  Nota que el hueso que se encuentra detrs de la oreja (mastoides) le duele al tocarlo. ASEGRESE DE QUE:   Comprende estas instrucciones.  Controlar su afeccin.  Recibir ayuda de inmediato si no  mejora o si empeora.   Esta informacin no tiene Theme park manager el consejo del mdico. Asegrese de hacerle al mdico cualquier pregunta que tenga.   You have fluid behind your ear drum which can lead to infection. Treat with 10 days of antibiotics and use of Mucinex OTC as needed and Advil 600-800mg  every 8 hours for discomfort. Please return here if you get worse. If not improving as expected then please f/u with PCP.    Document Released: 04/28/2005 Document Revised: 08/09/2014 Elsevier Interactive Patient Education [employer]

## 2015-08-26 NOTE — ED Notes (Signed)
Pt reports   Symptoms  Of  Fever      Cough  And  Earache  X  1  Week        pt  Sitting  Upright on the  Exam table  Speaking in  Complete  sentances

## 2015-08-26 NOTE — ED Provider Notes (Signed)
CSN: [id number]     Arrival date & time 08/26/15  1514 History   First MD Initiated Contact with Patient 08/26/15 1652     Chief Complaint  Patient presents with  . Fever   (Consider location/radiation/quality/duration/timing/severity/associated sxs/prior Treatment) HPI Comments: 40 yo Hispanic female presents with a week of bilateral ear pain, L>R with chills and subjective fevers. No prior history. Denies sore throat or nasal congestion. Pain is keeping her awake. Using Advil without relief. No history of seasonal allergies.   Patient is a 40 y.o. female presenting with fever. The history is provided by the patient.  Fever Associated symptoms: chills and ear pain   Associated symptoms: no rhinorrhea and no sore throat     Past Medical History  Diagnosis Date  . Diabetes mellitus     pre diabetic   . Acute appendicitis 01/13/2012  . Thyroid disease    Past Surgical History  Procedure Laterality Date  . Laparoscopic appendectomy  01/18/2012    Procedure: APPENDECTOMY LAPAROSCOPIC;  Surgeon: Currie Paris, MD;  Location: [facility] OR;  Service: General;  Laterality: N/A;  . Appendectomy     Family History  Problem Relation Age of Onset  . Diabetes Mother    Social History  Substance Use Topics  . Smoking status: Never Smoker   . Smokeless tobacco: Never Used  . Alcohol Use: No   OB History    No data available     Review of Systems  Constitutional: Positive for fever and chills.  HENT: Positive for ear pain. Negative for ear discharge, rhinorrhea, sinus pressure, sneezing, sore throat and tinnitus.   Respiratory: Negative.   Allergic/Immunologic: Negative for environmental allergies.  Neurological: Negative.     Allergies  Review of patient's allergies indicates no known allergies.  Home Medications   Prior to Admission medications   Medication Sig Start Date End Date Taking? Authorizing Provider  amoxicillin (AMOXIL) 500 MG capsule Take 1 capsule (500 mg total)  by mouth 2 (two) times daily. 08/26/15 09/05/15  Riki Sheer, PA-C  azithromycin (ZITHROMAX) 250 MG tablet Take 500 mg tablet today and continue taking 250 mg tablet daily until completed 10/10/13   Dorothea Ogle, MD  castor oil liquid Take by mouth.    Historical Provider, MD  fluticasone (FLONASE) 50 MCG/ACT nasal spray Place 2 sprays into both nostrils daily. 09/06/13   Acey Lav, MD  levothyroxine (SYNTHROID, LEVOTHROID) 25 MCG tablet Take 25 mcg by mouth daily.    Nurse Emergency, RN  loratadine (CLARITIN) 10 MG tablet Take 1 tablet (10 mg total) by mouth daily. 09/06/13   Acey Lav, MD  predniSONE (DELTASONE) 10 MG tablet Take 3 tablets (30 mg total) by mouth daily. 08/17/13   Rodolph Bong, MD   Meds Ordered and Administered this Visit  Medications - No data to display  BP 104/67 mmHg  Pulse 64  Temp(Src) 97.8 F (36.6 C) (Oral)  Resp 18  SpO2 100%  LMP 08/22/2015 No data found.   Physical Exam  Constitutional: She is oriented to person, place, and time. She appears well-developed and well-nourished. No distress.  HENT:  Head: Normocephalic and atraumatic.  Mouth/Throat: Oropharynx is clear and moist. No oropharyngeal exudate.  Moderate effusion in the left ear without perforation, mild effusion in the right ear without perforation. No tenderness with tragus or pinna pull. No exudate in canal  Eyes: Right eye exhibits no discharge. Left eye exhibits no discharge.  Neck: Normal range of  motion.  Pulmonary/Chest: Effort normal and breath sounds normal.  Lymphadenopathy:    She has no cervical adenopathy.  Neurological: She is alert and oriented to person, place, and time.  Skin: Skin is warm and dry. She is not diaphoretic.  Psychiatric: Her behavior is normal.  Nursing note and vitals reviewed.   ED Course  Procedures (including critical care time)  Labs Review Labs Reviewed - No data to display  Imaging Review No results found.   Visual Acuity  Review  Right Eye Distance:   Left Eye Distance:   Bilateral Distance:    Right Eye Near:   Left Eye Near:    Bilateral Near:         MDM   1. Otitis media with effusion, bilateral   2. Ear pain, bilateral    Effusion on exam warrants treatment with abx. Treat with Amox. F/U here or ER if worsens. If not improving then f/u with PCP. Use Advil 600-800mg  every 8 hours for pain.     Riki Sheer, PA-C 08/26/15 1705

## 2015-09-08 ENCOUNTER — Emergency Department (INDEPENDENT_AMBULATORY_CARE_PROVIDER_SITE_OTHER)
Admission: EM | Admit: 2015-09-08 | Discharge: 2015-09-08 | Disposition: A | Discharge status: Home / Self Care | Payer: No Typology Code available for payment source | Source: Home / Self Care | Attending: Emergency Medicine | Admitting: Emergency Medicine

## 2015-09-08 ENCOUNTER — Encounter (HOSPITAL_COMMUNITY): Payer: Self-pay | Admitting: Emergency Medicine

## 2015-09-08 ENCOUNTER — Other Ambulatory Visit (HOSPITAL_COMMUNITY)
Admission: RE | Admit: 2015-09-08 | Discharge: 2015-09-08 | Disposition: A | Discharge status: Home / Self Care | Payer: No Typology Code available for payment source | Source: Ambulatory Visit | Attending: Emergency Medicine | Admitting: Emergency Medicine

## 2015-09-08 ENCOUNTER — Ambulatory Visit: Payer: Self-pay | Attending: Internal Medicine

## 2015-09-08 DIAGNOSIS — Z113 Encounter for screening for infections with a predominantly sexual mode of transmission: Secondary | ICD-10-CM | POA: Insufficient documentation

## 2015-09-08 DIAGNOSIS — N760 Acute vaginitis: Secondary | ICD-10-CM | POA: Insufficient documentation

## 2015-09-08 DIAGNOSIS — N898 Other specified noninflammatory disorders of vagina: Secondary | ICD-10-CM

## 2015-09-08 DIAGNOSIS — N72 Inflammatory disease of cervix uteri: Secondary | ICD-10-CM

## 2015-09-08 LAB — POCT PREGNANCY, URINE: PREG TEST UR: NEGATIVE

## 2015-09-08 MED ORDER — AZITHROMYCIN 250 MG PO TABS
1000.0000 mg | ORAL_TABLET | Freq: Once | ORAL | Status: AC
  Administered 2015-09-08: 1000 mg via ORAL

## 2015-09-08 MED ORDER — METRONIDAZOLE 500 MG PO TABS: 500.0000 mg | ORAL_TABLET | Freq: Two times a day (BID) | ORAL | Status: DC

## 2015-09-08 MED ORDER — LIDOCAINE HCL (PF) 1 % IJ SOLN
INTRAMUSCULAR | Status: AC
  Filled 2015-09-08: qty 5

## 2015-09-08 MED ORDER — AZITHROMYCIN 250 MG PO TABS
ORAL_TABLET | ORAL | Status: AC
  Filled 2015-09-08: qty 4

## 2015-09-08 MED ORDER — CEFTRIAXONE SODIUM 250 MG IJ SOLR
INTRAMUSCULAR | Status: AC
  Filled 2015-09-08: qty 250

## 2015-09-08 MED ORDER — CEFTRIAXONE SODIUM 250 MG IJ SOLR
250.0000 mg | Freq: Once | INTRAMUSCULAR | Status: AC
  Administered 2015-09-08: 250 mg via INTRAMUSCULAR

## 2015-09-08 NOTE — ED Notes (Signed)
Discharge is pending.  Antibiotic injection has been given and there is past injection delay prior to discharge

## 2015-09-08 NOTE — ED Notes (Signed)
Kristen Holt, emt assisted with pelvic 

## 2015-09-08 NOTE — ED Notes (Signed)
Complains of vaginal itching for a week and reports vaginal discharge for a week.  Patient used monistat 3, used 3 days ago.  Denies abdominal pain, denies back pain, denies urinary symptoms.

## 2015-09-08 NOTE — Discharge Instructions (Signed)
Cervicitis (Cervicitis) Es el dolor e hinchazn (inflamacin) del cuello uterino. El cuello del tero se encuentra en la parte inferior del tero. Se abre hacia la vagina. CAUSAS   Enfermedades de transmisin sexual (ETS).   Reacciones alrgicas.   Medicamentos o dispositivos anticonceptivos que se Research officer, trade union.   Traumatismo en el cuello del tero.   Infecciones bacterianas.  FACTORES DE RIESGO Usted tendr mayor riesgo de sufrir esta enfermedad si:  Tiene relaciones sexuales sin proteccin.  Ha tenido relaciones sexuales con varias parejas.  Comienza a Management consultant a una edad temprana.  Tiene una historia de ETS. SNTOMAS  Puede ser que no haya sntomas. Si hay sntomas, pueden ser:   Secrecin vaginal de color gris, blanco, amarillo o que tiene mal olor.   Picazn o dolor en la zona externa de la vagina.   Relaciones sexuales dolorosas.   Dolor en la zona baja del abdomen o la espalda, especialmente durante las [employer].   Ganas de orinar con frecuencia.   Sangrado vaginal anormal entre perodos, despus de las relaciones sexuales o despus de la menopausia.   Sensacin de presin o pesadez en la pelvis.  DIAGNSTICO  El diagnstico se realiza mediante un examen plvico. Otras pruebas son:   Examen de las secreciones en el microscopio (preparado fresco).   Papanicolau  TRATAMIENTO  El tratamiento depender de la causa de la cervicitis. Si la causa es una enfermedad de transmisin sexual, usted y su pareja necesitarn Pensions consultant. Le prescribirn antibiticos.  INSTRUCCIONES PARA EL CUIDADO EN EL HOGAR   No tenga relaciones sexuales hasta que el mdico la autorice.   No tenga relaciones sexuales hasta que su compaero haya sido tratado si la causa de la cervicitis es una enfermedad de transmisin sexual.   Tome los antibiticos como se le indic. Tmelos todos, aunque se sienta mejor.  SOLICITE  ATENCIN MDICA SI:  Los sntomas vuelven a Research officer, trade union.   Tiene fiebre.  ASEGRESE DE QUE:   Comprende estas instrucciones.  Controlar su afeccin.  Recibir ayuda de inmediato si no mejora o si empeora.   Esta informacin no tiene Theme park manager el consejo del mdico. Asegrese de hacerle al mdico cualquier pregunta que tenga.   Document Released: 07/19/2005 Document Revised: 03/21/2013 Elsevier Interactive Patient Education 2016 Elsevier Inc.  Enfermedad plvica inflamatoria (Pelvic Inflammatory Disease) La enfermedad plvica inflamatoria (EPI) es una infeccin en algunos rganos sexuales femeninos o en todos ellos. Se puede presentar en el tero, los ovarios, las trompas de Falopio o los tejidos circundantes que se encuentran en el interior de la zona baja del vientre (pelvis). Sin tratamiento, la EPI puede causar problemas a Air cabin crew. Para detectar la presencia de esta enfermedad, el mdico puede hacer lo siguiente:  Un examen fsico.  Anlisis de sangre, anlisis de [place] prueba de [place].  Un examen de la secrecin vaginal.  Estudios para examinar el interior de la pelvis.  Estudios de Airline pilot de otras infecciones. CUIDADOS EN EL HOGAR  Tome los medicamentos de venta libre y los recetados solamente como se lo haya indicado el mdico.  Si le recetaron un antibitico, tmelo como se lo haya indicado el mdico. No deje de tomarlo aunque comience a sentirse mejor.  No mantenga relaciones sexuales hasta completar el tratamiento, o como se lo haya indicado el mdico.  Informe a su compaero sexual si tiene EPI. Es posible que esta persona deba recibir TEFL teacher.  Concurra a todas las visitas de  control como se lo haya indicado el mdico. Esto es importante.  El mdico puede repetir las pruebas para Sales promotion account executive la infeccin despus del tratamiento. SOLICITE AYUDA SI:  Observa que le sale ms lquido (secrecin) de la vagina o que emana un  lquido que no es normal.  El dolor no mejora.  Vomita.  Tiene fiebre.  No puede tomar los medicamentos.  Su compaero sexual tiene una enfermedad de transmisin sexual (ETS).  Siente dolor al [employer]. SOLICITE AYUDA DE INMEDIATO SI:  El dolor en el abdomen o en la parte baja del vientre es ms intenso.  Tiene escalofros.  No mejora despus de transcurridas 72horas.   Esta informacin no tiene Theme park manager el consejo del mdico. Asegrese de hacerle al mdico cualquier pregunta que tenga.   Document Released: 01/18/2012 Document Revised: 04/09/2015 Elsevier Interactive Patient Education [employer]

## 2015-09-08 NOTE — ED Provider Notes (Signed)
CSN: [id number]     Arrival date & time 09/08/15  1308 History   First MD Initiated Contact with Patient 09/08/15 1358     Chief Complaint  Patient presents with  . Vaginal Itching   (Consider location/radiation/quality/duration/timing/severity/associated sxs/prior Treatment) HPI Comments: 40 year old spending female is complaining of vaginal itching for over a week. She complains of a small amount of discharge. Denies fever, pelvic pain, dysuria, frequency or other urinary symptoms. Denies abdominal pain. She has used and OTC vaginal cream apparently without favorable results.   Past Medical History  Diagnosis Date  . Diabetes mellitus     pre diabetic   . Acute appendicitis 01/13/2012  . Thyroid disease    Past Surgical History  Procedure Laterality Date  . Laparoscopic appendectomy  01/18/2012    Procedure: APPENDECTOMY LAPAROSCOPIC;  Surgeon: Currie Paris, MD;  Location: [facility] OR;  Service: General;  Laterality: N/A;  . Appendectomy     Family History  Problem Relation Age of Onset  . Diabetes Mother    Social History  Substance Use Topics  . Smoking status: Never Smoker   . Smokeless tobacco: Never Used  . Alcohol Use: No   OB History    No data available     Review of Systems  Constitutional: Negative for fever, activity change and fatigue.  HENT: Negative.   Respiratory: Negative.   Cardiovascular: Negative.   Genitourinary: Positive for vaginal discharge. Negative for dysuria, urgency, frequency, flank pain, vaginal bleeding and pelvic pain.  Musculoskeletal: Negative.     Allergies  Review of patient's allergies indicates no known allergies.  Home Medications   Prior to Admission medications   Medication Sig Start Date End Date Taking? Authorizing Provider  castor oil liquid Take by mouth.    Historical Provider, MD  fluticasone (FLONASE) 50 MCG/ACT nasal spray Place 2 sprays into both nostrils daily. 09/06/13   Acey Lav, MD  levothyroxine  (SYNTHROID, LEVOTHROID) 25 MCG tablet Take 25 mcg by mouth daily.    Nurse Emergency, RN  loratadine (CLARITIN) 10 MG tablet Take 1 tablet (10 mg total) by mouth daily. 09/06/13   Acey Lav, MD  metroNIDAZOLE (FLAGYL) 500 MG tablet Take 1 tablet (500 mg total) by mouth 2 (two) times daily. X 7 days 09/08/15   Hayden Rasmussen, NP   Meds Ordered and Administered this Visit   Medications  azithromycin [facility]) tablet 1,000 mg (not administered)  cefTRIAXone (ROCEPHIN) injection 250 mg (not administered)    BP 108/79 mmHg  Pulse 79  Temp(Src) 97.8 F (36.6 C) (Oral)  Resp 14  SpO2 100%  LMP 08/22/2015 No data found.   Physical Exam  Constitutional: She is oriented to person, place, and time. She appears well-developed and well-nourished. No distress.  Eyes: EOM are normal.  Neck: Normal range of motion. Neck supple.  Cardiovascular: Normal rate.   Pulmonary/Chest: Effort normal. No respiratory distress.  Genitourinary: Vaginal discharge found.  Normal external female genitalia Cervix midline and far anterior. Ectocervix is friable with small amount of bleeding from the os. Ectocervix with erythema and a braced appearance near the os. Bimanual with CMT and bilateral adnexal tenderness.  Musculoskeletal: She exhibits no edema.  Neurological: She is alert and oriented to person, place, and time. She exhibits normal muscle tone.  Skin: Skin is warm and dry.  Psychiatric: She has a normal mood and affect.  Nursing note and vitals reviewed.   ED Course  Procedures (including critical care time)  Labs Review  Labs Reviewed  POCT PREGNANCY, URINE  CERVICOVAGINAL ANCILLARY ONLY   Results for orders placed or performed during the hospital encounter of 09/08/15  Pregnancy, urine POC  Result Value Ref Range   Preg Test, Ur NEGATIVE NEGATIVE     Imaging Review No results found.   Visual Acuity Review  Right Eye Distance:   Left Eye Distance:   Bilateral Distance:    Right  Eye Near:   Left Eye Near:    Bilateral Near:         MDM   1. Vaginal discharge   2. Cervicitis    Meds ordered this encounter  Medications  . azithromycin (ZITHROMAX) tablet 1,000 mg    Sig:   . cefTRIAXone (ROCEPHIN) injection 250 mg    Sig:   . metroNIDAZOLE (FLAGYL) 500 MG tablet    Sig: Take 1 tablet (500 mg total) by mouth 2 (two) times daily. X 7 days    Dispense:  14 tablet    Refill:  0    Order Specific Question:  Supervising Provider    Answer:  Linna Hoff [[id number]]       Hayden Rasmussen, NP 09/08/15 1425

## 2015-09-09 LAB — CERVICOVAGINAL ANCILLARY ONLY
Chlamydia: NEGATIVE
Neisseria Gonorrhea: NEGATIVE
WET PREP (BD AFFIRM): POSITIVE — AB

## 2015-09-10 ENCOUNTER — Encounter: Payer: Self-pay | Admitting: Internal Medicine

## 2015-09-10 ENCOUNTER — Ambulatory Visit: Payer: No Typology Code available for payment source | Attending: Internal Medicine | Admitting: Internal Medicine

## 2015-09-10 VITALS — BP 106/69 | HR 73 | Temp 98.0°F | Resp 16 | Ht 62.0 in | Wt 143.0 lb

## 2015-09-10 DIAGNOSIS — E119 Type 2 diabetes mellitus without complications: Secondary | ICD-10-CM | POA: Insufficient documentation

## 2015-09-10 DIAGNOSIS — Z0000 Encounter for general adult medical examination without abnormal findings: Secondary | ICD-10-CM | POA: Insufficient documentation

## 2015-09-10 DIAGNOSIS — L298 Other pruritus: Secondary | ICD-10-CM

## 2015-09-10 DIAGNOSIS — E079 Disorder of thyroid, unspecified: Secondary | ICD-10-CM | POA: Insufficient documentation

## 2015-09-10 DIAGNOSIS — N898 Other specified noninflammatory disorders of vagina: Secondary | ICD-10-CM

## 2015-09-10 DIAGNOSIS — Z79899 Other long term (current) drug therapy: Secondary | ICD-10-CM | POA: Insufficient documentation

## 2015-09-10 LAB — CBC WITH DIFFERENTIAL/PLATELET
BASOS ABS: 0.1 10*3/uL (ref 0.0–0.1)
Basophils Relative: 1 % (ref 0–1)
Eosinophils Absolute: 0.3 10*3/uL (ref 0.0–0.7)
Eosinophils Relative: 5 % (ref 0–5)
HEMATOCRIT: 37.9 % (ref 36.0–46.0)
HEMOGLOBIN: 12.2 g/dL (ref 12.0–15.0)
LYMPHS ABS: 2.3 10*3/uL (ref 0.7–4.0)
LYMPHS PCT: 36 % (ref 12–46)
MCH: 28.6 pg (ref 26.0–34.0)
MCHC: 32.2 g/dL (ref 30.0–36.0)
MCV: 89 fL (ref 78.0–100.0)
MONOS PCT: 10 % (ref 3–12)
MPV: 10.7 fL (ref 8.6–12.4)
Monocytes Absolute: 0.6 10*3/uL (ref 0.1–1.0)
NEUTROS PCT: 48 % (ref 43–77)
Neutro Abs: 3 10*3/uL (ref 1.7–7.7)
Platelets: 315 10*3/uL (ref 150–400)
RBC: 4.26 MIL/uL (ref 3.87–5.11)
RDW: 14.6 % (ref 11.5–15.5)
WBC: 6.3 10*3/uL (ref 4.0–10.5)

## 2015-09-10 LAB — POCT URINALYSIS DIPSTICK
Bilirubin, UA: NEGATIVE
GLUCOSE UA: NEGATIVE
KETONES UA: NEGATIVE
Nitrite, UA: NEGATIVE
Protein, UA: NEGATIVE
SPEC GRAV UA: 1.01
UROBILINOGEN UA: 0.2
pH, UA: 6

## 2015-09-10 LAB — T4, FREE: Free T4: 1.1 ng/dL (ref 0.8–1.8)

## 2015-09-10 LAB — TSH: TSH: 1.77 mIU/L

## 2015-09-10 MED ORDER — FLUCONAZOLE 150 MG PO TABS: ORAL_TABLET | ORAL | Status: DC

## 2015-09-10 MED FILL — FLUCONAZOLE 150 MG TABLET: 150 | 2 days supply | Qty: 2 | Fill #0

## 2015-09-10 NOTE — Progress Notes (Signed)
Interpreter line used Kristen Holt ID# [id number] Patient states she is here for a check up and pap Patient was seen in the ED Monday for vaginal itching and Prescribed flagyl Patient states she is still having the itching

## 2015-09-10 NOTE — Progress Notes (Signed)
Patient ID: Kristen Holt, female   DOB: 05-30-1976, 40 y.o.   MRN: [id number]  CC: physical  HPI: Kristen Holt is a 40 y.o. female here today for a follow up visit.  Patient has past medical history of thyroid disease. Patient reports that she was seen in ER 2 days ago for vaginal itching.and was given Flagyl. She has taken the medication for 2 days and states that she continues to itch. She reportedly used Monistat 3 days cream last week with no relief but states that it irritated her more.  Patient reports that she has not been on Synthroid in several years because she was told that her levels were normal and she no longer required the medication. She states that she feels cold often and then sometimes feels hot. She is requesting to have her levels checked today. No weight changes, hair changes, constipation. She does admit to feeling nervous often. Her menstrual cycle is normal.  She would like a physical today. She denies alcohol, drug, or tobacco use. She is up to date on her pap. No other concerns today.   No Known Allergies Past Medical History  Diagnosis Date  . Diabetes mellitus     pre diabetic   . Acute appendicitis 01/13/2012  . Thyroid disease    Current Outpatient Prescriptions on File Prior to Visit  Medication Sig Dispense Refill  . levothyroxine (SYNTHROID, LEVOTHROID) 25 MCG tablet Take 25 mcg by mouth daily.    Marland Kitchen loratadine (CLARITIN) 10 MG tablet Take 1 tablet (10 mg total) by mouth daily. 30 tablet 2  . metroNIDAZOLE (FLAGYL) 500 MG tablet Take 1 tablet (500 mg total) by mouth 2 (two) times daily. X 7 days 14 tablet 0  . castor oil liquid Take by mouth.    . fluticasone (FLONASE) 50 MCG/ACT nasal spray Place 2 sprays into both nostrils daily. 16 g 2  . [DISCONTINUED] Ranitidine HCl (ZANTAC PO) Take by mouth.     No current facility-administered medications on file prior to visit.   Family History  Problem Relation Age of Onset  . Diabetes  Mother    Social History   Social History  . Marital Status: Single    Spouse Name: N/A  . Number of Children: N/A  . Years of Education: N/A   Occupational History  . Not on file.   Social History Main Topics  . Smoking status: Never Smoker   . Smokeless tobacco: Never Used  . Alcohol Use: No  . Drug Use: No  . Sexual Activity:    Partners: Male   Other Topics Concern  . Not on file   Social History Narrative   ** Merged History Encounter **        Review of Systems: Constitutional: Negative for fever, chills, diaphoresis, activity change, appetite change and fatigue. HENT: Negative for ear pain, nosebleeds, congestion, facial swelling, rhinorrhea, neck pain, neck stiffness and ear discharge.  Eyes: Negative for pain, discharge, redness, itching and visual disturbance. Respiratory: Negative for cough, choking, chest tightness, shortness of breath, wheezing and stridor.  Cardiovascular: Negative for chest pain, palpitations and leg swelling. Gastrointestinal: Negative for abdominal distention. Genitourinary: Negative for dysuria, urgency, frequency, hematuria, flank pain, decreased urine volume, difficulty urinating and dyspareunia.  Musculoskeletal: Negative for back pain, joint swelling, arthralgias and gait problem. Neurological: Negative for dizziness, tremors, seizures, syncope, facial asymmetry, speech difficulty, weakness, light-headedness, numbness and headaches.  Hematological: Negative for adenopathy. Does not bruise/bleed easily. Psychiatric/Behavioral: Negative for hallucinations, behavioral problems, confusion,  dysphoric mood, decreased concentration and agitation.    Objective:   Filed Vitals:   09/10/15 1023  BP: 106/69  Pulse: 73  Temp: 98 F (36.7 C)  Resp: 16    Physical Exam: Constitutional: Patient appears well-developed and well-nourished. No distress. HENT: Normocephalic, atraumatic, External right and left ear normal. Oropharynx is clear  and moist.  Eyes: Conjunctivae and EOM are normal. PERRLA, no scleral icterus. Neck: Normal ROM. Neck supple. No JVD. No tracheal deviation. No thyromegaly. CVS: RRR, S1/S2 +, no murmurs, no gallops, no carotid bruit.  Pulmonary: Effort and breath sounds normal, no stridor, rhonchi, wheezes, rales.  Abdominal: Soft. BS +,  no distension, tenderness, rebound or guarding.  Musculoskeletal: Normal range of motion. No edema and no tenderness.  Lymphadenopathy: No lymphadenopathy noted, cervical, inguinal or axillary Neuro: Alert. Normal reflexes, muscle tone coordination. No cranial nerve deficit. Skin: Skin is warm and dry. No rash noted. Not diaphoretic. No erythema. No pallor. Psychiatric: Normal mood and affect. Behavior, judgment, thought content normal.  Lab Results  Component Value Date   WBC 7.4 08/17/2013   HGB 13.0 08/17/2013   HCT 38.0 08/17/2013   MCV 88.4 08/17/2013   PLT 329 08/17/2013   Lab Results  Component Value Date   CREATININE 0.50 08/17/2013   BUN 15 08/17/2013   NA 138 08/17/2013   K 4.1 08/17/2013   CL 102 08/17/2013   CO2 24 08/17/2013    No results found for: HGBA1C Lipid Panel  No results found for: CHOL, TRIG, HDL, CHOLHDL, VLDL, LDLCALC     Assessment and plan:   [name] was seen today for follow-up.  Diagnoses and all orders for this visit:  Annual physical exam -     T4, Free -     TSH -     CBC with Differential  Vaginal itching -     Urinalysis Dipstick -     Begin fluconazole (DIFLUCAN) 150 MG tablet; Take 1 pill today and then repeat in 3 days  Health care maintenance -     Flu Vaccine QUAD 36+ mos PF IM (Fluarix & Fluzone Quad PF)   Return if symptoms worsen or fail to improve.       Ambrose Finland, NP-C [facility] [phone] 09/10/2015, 10:37 AM

## 2015-09-11 ENCOUNTER — Telehealth (HOSPITAL_COMMUNITY): Payer: Self-pay | Admitting: Emergency Medicine

## 2015-09-11 NOTE — ED Notes (Signed)
Called pt and notified of recent lab results from visit Pt ID'd properly... Reports sx have subsided and is feeling better after PCP rx Diflucan on 2/8 Wanted to know if she could stop Flagyl Hayden Rasmussen, NP rx... Adv pt she could stop and continue Diflucan  Per Dr. Dayton Scrape,  Test for candida (yeast) was positive, patient received rx for diflucan at [facility] visit 09/08/15. Recheck or FU pcp/Valerie Luna Glasgow for persistent symptoms. LM  Please let patient know that tests for gonorrhea/chlamydia were negative  Adv pt if sx are not getting better to return  Education on safe sex given Pt verb understanding.

## 2015-09-15 ENCOUNTER — Telehealth: Payer: Self-pay

## 2015-09-15 ENCOUNTER — Emergency Department (INDEPENDENT_AMBULATORY_CARE_PROVIDER_SITE_OTHER)
Admission: EM | Admit: 2015-09-15 | Discharge: 2015-09-15 | Disposition: A | Discharge status: Home / Self Care | Payer: No Typology Code available for payment source | Source: Home / Self Care | Attending: Emergency Medicine | Admitting: Emergency Medicine

## 2015-09-15 ENCOUNTER — Encounter (HOSPITAL_COMMUNITY): Payer: Self-pay | Admitting: Emergency Medicine

## 2015-09-15 DIAGNOSIS — L298 Other pruritus: Secondary | ICD-10-CM

## 2015-09-15 DIAGNOSIS — N898 Other specified noninflammatory disorders of vagina: Secondary | ICD-10-CM

## 2015-09-15 LAB — POCT URINALYSIS DIP (DEVICE)
BILIRUBIN URINE: NEGATIVE
GLUCOSE, UA: NEGATIVE mg/dL
KETONES UR: NEGATIVE mg/dL
LEUKOCYTES UA: NEGATIVE
Nitrite: NEGATIVE
Protein, ur: NEGATIVE mg/dL
SPECIFIC GRAVITY, URINE: 1.025 (ref 1.005–1.030)
Urobilinogen, UA: 0.2 mg/dL (ref 0.0–1.0)
pH: 5.5 (ref 5.0–8.0)

## 2015-09-15 LAB — POCT PREGNANCY, URINE: PREG TEST UR: NEGATIVE

## 2015-09-15 NOTE — Telephone Encounter (Signed)
-----   Message from Ambrose Finland, NP sent at 09/15/2015  9:09 AM EST ----- Labs are normal please refill current dose of Synthroid

## 2015-09-15 NOTE — Discharge Instructions (Signed)
Prurito (Pruritus) El prurito es la sensacin de picazn. Hay muchas afecciones y factores diferentes que pueden causar picazn en la piel. La piel seca es una de las causas ms frecuentes de picazn. La mayora de las causas de picazn no requieren atencin mdica. La picazn en la piel puede convertirse en erupcin cutnea.  INSTRUCCIONES PARA EL CUIDADO EN EL HOGAR  Controle el prurito para detectar cualquier cambio. Siga estos pasos para controlar la afeccin:  Cuidado de la piel  Humctese la piel segn sea necesario. Un humectante con vaselina es lo ms adecuado para mantener la humedad de la piel.  Tome o aplquese los medicamentos solamente como se lo haya indicado el mdico. Esto puede incluir lo siguiente:  Crema con corticoides.  Lociones para aliviar la picazn.  Antihistamnicos orales.  Aplique compresas fras en las zonas afectadas.  Trate de tomar un bao con lo siguiente:  Sales de [place]. Siga las instrucciones del envase. Puede conseguirlas en la tienda de comestibles o la farmacia local.  Bicarbonato de sodio. Vierta un poco en la baera como se lo haya indicado el mdico.  Avena coloidal. Siga las instrucciones del envase. Puede conseguirla en la tienda de comestibles o la farmacia local.  Intente colocarse una pasta de bicarbonato de sodio sobre la piel. Agregue agua al bicarbonato de sodio y revuelva hasta alcanzar la consistencia de una pasta.   No se rasque la piel.  Evite los baos de inmersin y las duchas calientes, que pueden empeorar la picazn. La ducha fra puede aliviar la picazn siempre que despus use un humectante.  Evite los detergentes y los jabones perfumados, y los perfumes. Utilice jabones, detergentes, perfumes y cosmticos suaves. Instrucciones generales  Evite usar ropa ajustada.  Lleve un diario como ayuda para registrar lo que le causa picazn. Escriba los siguientes datos:  Lo que come.  Los cosmticos que utiliza.  Lo que  bebe.  La ropa que usa. Esto incluye las alhajas.  Use un humidificador. Este mantiene la humedad del aire, lo que ayuda a evitar la piel seca. SOLICITE ATENCIN MDICA SI:  La picazn no desaparece despus de varios das.  Transpira de noche.  Baja de peso.  Tiene una sed inusual.  Orina ms de lo normal.  Est ms cansado que lo habitual.  Siente dolor abdominal.  Siente hormigueos en la piel.  Se siente dbil.  Tiene un color amarillo en la piel o en la zona blanca del ojo (ictericia).  Siente la piel entumecida.   Esta informacin no tiene como fin reemplazar el consejo del mdico. Asegrese de hacerle al mdico cualquier pregunta que tenga.   Document Released: 03/31/2011 Document Revised: 12/03/2014 Elsevier Interactive Patient Education 2016 Elsevier Inc.  

## 2015-09-15 NOTE — ED Notes (Signed)
Pt c/o persistent vag/anal itching onset x2 weeks  Seen here on 1/24 for similar sx and by PCP Tested pos for Gardnerella and was given Diflucan by PCP w/temporary relief Also reports persistent clear vag d/c States she did not mention to provider last time she was here she tried "anal intercourse" for the 1st time w/partner b/c she was embarrassed  Concerned this may be the reason of itching A&O x4... No acute distress.

## 2015-09-15 NOTE — Telephone Encounter (Signed)
Interpreter line used Cheri Guppy ID# [id number] Called patient  Patient not availalble Unable to leave message voice mail full

## 2015-09-15 NOTE — ED Provider Notes (Signed)
CSN: [id number]     Arrival date & time 09/15/15  1315 History   First MD Initiated Contact with Patient 09/15/15 1416     Chief Complaint  Patient presents with  . Vaginal Itching   (Consider location/radiation/quality/duration/timing/severity/associated sxs/prior Treatment) HPI History obtained from patient with interpreter   LOCATION: Vagina/rectum SEVERITY: No pain just itching DURATION: Over 2 weeks CONTEXT: Onset 2 weeks ago after anal intercourse QUALITY: Symptoms are similar to symptoms she had initially several weeks ago. MODIFYING FACTORS: She has been seen in urgent care and treated with metronidazole, ceftriaxone, azithromycin and also seen by her PCP treated for vaginal candidiasis with Diflucan. ASSOCIATED SYMPTOMS: Continued itching in the vaginal area but now itching in the rectal area also. TIMING: Constant OCCUPATION:  Past Medical History  Diagnosis Date  . Diabetes mellitus     pre diabetic   . Acute appendicitis 01/13/2012  . Thyroid disease    Past Surgical History  Procedure Laterality Date  . Laparoscopic appendectomy  01/18/2012    Procedure: APPENDECTOMY LAPAROSCOPIC;  Surgeon: Currie Paris, MD;  Location: [facility] OR;  Service: General;  Laterality: N/A;  . Appendectomy     Family History  Problem Relation Age of Onset  . Diabetes Mother    Social History  Substance Use Topics  . Smoking status: Never Smoker   . Smokeless tobacco: Never Used  . Alcohol Use: No   OB History    No data available     Review of Systems ROS +'ve vaginal and rectal itching  DENIES; CHANGE IN ACTIVITY, CONGESTION, HEADACHE, CHEST PAIN, ABDOMINAL PAIN, SHORTNESS OF BREATH, WHEEZING, EXCESSIVE THIRST OR URINATION, SKIN RASH, DIFFICULTY WITH URINATION, DYSURIA, AGITATION, BALANCE ISSUES  Allergies  Review of patient's allergies indicates no known allergies.  Home Medications   Prior to Admission medications   Medication Sig Start Date End Date Taking?  Authorizing Provider  castor oil liquid Take by mouth.    Historical Provider, MD  fluconazole (DIFLUCAN) 150 MG tablet Take 1 pill today and then repeat in 3 days 09/10/15   Ambrose Finland, NP  fluticasone (FLONASE) 50 MCG/ACT nasal spray Place 2 sprays into both nostrils daily. 09/06/13   Acey Lav, MD  levothyroxine (SYNTHROID, LEVOTHROID) 25 MCG tablet Take 25 mcg by mouth daily.    Nurse Emergency, RN  loratadine (CLARITIN) 10 MG tablet Take 1 tablet (10 mg total) by mouth daily. 09/06/13   Acey Lav, MD  metroNIDAZOLE (FLAGYL) 500 MG tablet Take 1 tablet (500 mg total) by mouth 2 (two) times daily. X 7 days 09/08/15   Hayden Rasmussen, NP   Meds Ordered and Administered this Visit  Medications - No data to display  BP 116/74 mmHg  Pulse 79  Temp(Src) 97.9 F (36.6 C) (Oral)  Resp 16  SpO2 100%  LMP 08/22/2015 No data found.   Physical Exam  Constitutional: She is oriented to person, place, and time. She appears well-developed and well-nourished. No distress.  HENT:  Head: Normocephalic and atraumatic.  Eyes: Conjunctivae are normal.  Genitourinary:  Vaginal exam is performed with patient's permission and female chaperone  Present. Pubic mons is shaved but no signs of folliculitis. There is no vaginal discharge noted. There is no sign of a yeast infection noted. Anal exam is normal also. No hemorrhoids or skin tags noted.  Musculoskeletal: Normal range of motion.  Neurological: She is alert and oriented to person, place, and time.  Skin: Skin is warm and dry.  Psychiatric:  She has a normal mood and affect. Her behavior is normal.  Nursing note and vitals reviewed.   ED Course  Procedures (including critical care time)  Labs Review Labs Reviewed  POCT URINALYSIS DIP (DEVICE) - Abnormal; Notable for the following:    Hgb urine dipstick TRACE (*)    All other components within normal limits  POCT PREGNANCY, URINE    Imaging Review No results found.   Visual Acuity  Review  Right Eye Distance:   Left Eye Distance:   Bilateral Distance:    Right Eye Near:   Left Eye Near:    Bilateral Near:         MDM   1. Itching in the vaginal area   Follow up with GYN service if symptoms persist.      Tharon Aquas, PA 09/15/15 1624

## 2015-09-22 ENCOUNTER — Telehealth: Payer: Self-pay | Admitting: Internal Medicine

## 2015-09-22 ENCOUNTER — Other Ambulatory Visit: Payer: Self-pay

## 2015-09-22 DIAGNOSIS — Z0000 Encounter for general adult medical examination without abnormal findings: Secondary | ICD-10-CM

## 2015-09-22 NOTE — Telephone Encounter (Signed)
Pt. Called requesting a referral to the dentist. Please f/u with pt.

## 2015-09-30 ENCOUNTER — Telehealth: Payer: Self-pay

## 2015-09-30 NOTE — Telephone Encounter (Signed)
Pt. Came in today stating that when she spoke with the nurse she was told her Labs were norml but she still had to keep taking a medication. Pt. Does not remember what medication it is. After reading the past notes I saw that she was suppose to have a refill for Synthroid. Please f/u with pt.

## 2015-09-30 NOTE — Telephone Encounter (Signed)
Interpreter line used Hefz[employer] ID# [id number] Returned call to patient  Patient not available  Message left on machine to return our call

## 2015-10-03 ENCOUNTER — Telehealth: Payer: Self-pay

## 2015-10-03 MED ORDER — LEVOTHYROXINE SODIUM 25 MCG PO TABS: 25.0000 ug | ORAL_TABLET | Freq: Every day | ORAL | Status: DC

## 2015-10-03 MED FILL — LEVOTHYROXINE 25 MCG TABLET: 25 | 30 days supply | Qty: 30 | Fill #0 | Status: TO

## 2015-10-03 NOTE — Telephone Encounter (Signed)
Interpreter line used Gerilyn Pilgrimjacob ID# [id number] Returned patient phone call  Patient is aware of her current lab results and to pick up Her RX at the pharmacy here

## 2015-10-03 NOTE — Telephone Encounter (Signed)
Pt. Returned call. Please f/u with pt. °

## 2016-01-12 ENCOUNTER — Telehealth: Payer: Self-pay | Admitting: Internal Medicine

## 2016-01-12 MED ORDER — LEVOTHYROXINE SODIUM 25 MCG PO TABS: 25.0000 ug | ORAL_TABLET | Freq: Every day | ORAL | Status: DC

## 2016-01-12 MED FILL — ?LEVOTHYROXINE 25 MCG TABLE: 25 | 30 days supply | Qty: 30 | Fill #0 | Status: TO

## 2016-01-12 NOTE — Telephone Encounter (Signed)
Patient is needing a refill for synthroid. Please follow up.

## 2016-01-12 NOTE — Telephone Encounter (Signed)
Levothyroxine has been refilled.

## 2016-04-20 ENCOUNTER — Encounter: Payer: Self-pay | Admitting: Internal Medicine

## 2016-04-20 ENCOUNTER — Ambulatory Visit: Payer: Self-pay | Attending: Internal Medicine | Admitting: Internal Medicine

## 2016-04-20 VITALS — BP 98/63 | HR 64 | Temp 98.1°F | Resp 16 | Wt 142.2 lb

## 2016-04-20 DIAGNOSIS — H9201 Otalgia, right ear: Secondary | ICD-10-CM | POA: Insufficient documentation

## 2016-04-20 DIAGNOSIS — Z0000 Encounter for general adult medical examination without abnormal findings: Secondary | ICD-10-CM | POA: Insufficient documentation

## 2016-04-20 DIAGNOSIS — E039 Hypothyroidism, unspecified: Secondary | ICD-10-CM | POA: Insufficient documentation

## 2016-04-20 DIAGNOSIS — Z23 Encounter for immunization: Secondary | ICD-10-CM | POA: Insufficient documentation

## 2016-04-20 NOTE — Progress Notes (Signed)
Kristen Holt, is a 40 y.o. female  UJW:[id number]:[id number]  [id number]  DOB - 12/11/1975  CC:  Chief Complaint  Patient presents with  . Establish Care       HPI: Kristen Holt is a 40 y.o. female here today to establish medical care.  Patient was last seen on February 2017 [facility] hypothyroidism. She states she feels about the same with the thyroid medicines, may be a little bit off. She complains of 3-4 days of left ear pain with some neck discomfort as well. Denies fevers, denies chills, denies cough. About 4 days ago she thought she might of had a low-grade temperature.  Patient has No headache, No chest pain, No abdominal pain - No Nausea, No new weakness tingling or numbness, No Cough - SOB.  Denies smoking or drinking  Patient states she saw her dentist about 2 months ago and has a follow-up appointment next week for follow-up.  She notices sometimes when she is chewing gum and her jaw may hurt a little bit as well.  Review of Systems: Per HPI, o/w all systems reviewed and negative.   No Known Allergies Past Medical History:  Diagnosis Date  . Acute appendicitis 01/13/2012  . Diabetes mellitus    pre diabetic   . Thyroid disease    Current Outpatient Prescriptions on File Prior to Visit  Medication Sig Dispense Refill  . castor oil liquid Take by mouth.    . levothyroxine (SYNTHROID, LEVOTHROID) 25 MCG tablet Take 1 tablet (25 mcg total) by mouth daily. 30 tablet 2  . loratadine (CLARITIN) 10 MG tablet Take 1 tablet (10 mg total) by mouth daily. 30 tablet 2  . fluconazole (DIFLUCAN) 150 MG tablet Take 1 pill today and then repeat in 3 days (Patient not taking: Reported on 04/20/2016) 2 tablet 0  . fluticasone (FLONASE) 50 MCG/ACT nasal spray Place 2 sprays into both nostrils daily. (Patient not taking: Reported on 04/20/2016) 16 g 2  . metroNIDAZOLE (FLAGYL) 500 MG tablet Take 1 tablet (500 mg total) by mouth 2 (two) times daily. X 7 days (Patient not  taking: Reported on 04/20/2016) 14 tablet 0  . [DISCONTINUED] Ranitidine HCl (ZANTAC PO) Take by mouth.     No current facility-administered medications on file prior to visit.    Family History  Problem Relation Age of Onset  . Diabetes Mother    Social History   Social History  . Marital status: Single    Spouse name: N/A  . Number of children: N/A  . Years of education: N/A   Occupational History  . Not on file.   Social History Main Topics  . Smoking status: Never Smoker  . Smokeless tobacco: Never Used  . Alcohol use No  . Drug use: No  . Sexual activity: Yes    Partners: Male   Other Topics Concern  . Not on file   Social History Narrative   ** Merged History Encounter **        Objective:   Vitals:   04/20/16 1655  BP: 98/63  Pulse: 64  Resp: 16  Temp: 98.1 F (36.7 C)    Filed Weights   04/20/16 1655  Weight: 142 lb 3.2 oz (64.5 kg)    BP Readings from Last 3 Encounters:  04/20/16 98/63  09/15/15 116/74  09/10/15 106/69    Physical Exam: Constitutional: Patient appears well-developed and well-nourished. No distress. AAOx3, pleasant. HENT: Normocephalic, atraumatic, External right and left ear normal. Oropharynx is clear and moist.  bilat TMs clear, no erythema/edema/irritation bilat. No enlarged tonsils/exudate noted. Poor dentition w/ numerous filled cavities.  tmj nttp.  Palpation of each of left upper and right teeth nttp. Eyes: Conjunctivae and EOM are normal. PERRL, no scleral icterus. Neck: Normal ROM. Neck supple. No JVD. No tracheal deviation. No thyromegaly. CVS: RRR, S1/S2 +, no murmurs, no gallops, no carotid bruit.  Pulmonary: Effort and breath sounds normal, no stridor, rhonchi, wheezes, rales.  Abdominal: Soft. BS +, no distension, tenderness, rebound or guarding.  Musculoskeletal: Normal range of motion. No edema and no tenderness.  LE: bilat/ no c/c/e, pulses 2+ bilateral. Lymphadenopathy: No lymphadenopathy noted,  cervical Neuro: Alert. muscle tone coordination wnl. No cranial nerve deficit grossly. Skin: Skin is warm and dry. No rash noted. Not diaphoretic. No erythema. No pallor. Psychiatric: Normal mood and affect. Behavior, judgment, thought content normal.  Lab Results  Component Value Date   WBC 6.3 09/10/2015   HGB 12.2 09/10/2015   HCT 37.9 09/10/2015   MCV 89.0 09/10/2015   PLT 315 09/10/2015   Lab Results  Component Value Date   CREATININE 0.50 08/17/2013   BUN 15 08/17/2013   NA 138 08/17/2013   K 4.1 08/17/2013   CL 102 08/17/2013   CO2 24 08/17/2013    No results found for: HGBA1C Lipid Panel  No results found for: CHOL, TRIG, HDL, CHOLHDL, VLDL, LDLCALC     Depression screen [facility] 2/9 04/20/2016  Decreased Interest 0  Down, Depressed, Hopeless 0  PHQ - 2 Score 0    Assessment and plan:   1. Hypothyroidism, unspecified hypothyroidism type Will hold of on renewal of synthroid until labs - TSH - T4, Free  2. Annual physical exam - BASIC METABOLIC PANEL WITH GFR - VITAMIN D 25 Hydroxy (Vit-D Deficiency, Fractures)  3. Flu vaccine need - Flu Vaccine QUAD 36+ mos PF IM (Fluarix & Fluzone Quad PF)  4. Right ear pain - I could not find any obvious infections, could be related to dental pain (no active swelling /signs of infection to warrant abx though).  - may be tmj, handout provided, advised to f/u w/ her dentist next week, as well as avoid chewing gum.  She may also be getting over URI recently as well, no abx required also  5. Pt does not recall last tdap - tdap today Return in about 3 months (around 07/20/2016) for pap then as well.  The patient was given clear instructions to go to ER or return to medical center if symptoms don't improve, worsen or new problems develop. The patient verbalized understanding. The patient was told to call to get lab results if they haven't heard anything in the next week.    This note has been created with Engineer, agricultural. Any transcriptional errors are unintentional.   Pete Glatter, MD, MBA/MHA [facility] [place], [place] [phone]   04/20/2016, 5:44 PM

## 2016-04-20 NOTE — Patient Instructions (Addendum)
Sndrome de la articulacin temporomandibular (Temporomandibular Joint Syndrome) El sndrome de Sports coach (ATM) afecta las articulaciones que se encuentran entre la [place] y el crneo. Las articulaciones temporomandibulares estn ubicadas cerca de las orejas y permiten que la [place] se abra y se cierre. Estas articulaciones y los msculos adyacentes intervienen en todos los movimientos de la [place]. Las Engineer, manufacturing con sndrome de la articulacin temporomandibular tienen dolor en la zona de estas articulaciones y [employer]. Masticar, morder u Field seismologist otros movimientos con la mandbula puede ser difcil o doloroso. Las causas de este sndrome pueden ser diversas. En muchos casos, la afeccin es leve y desaparece en el trmino de unas pocas semanas. En algunas personas, la afeccin puede convertirse en un problema prolongado. CAUSAS Las causas posibles del sndrome de la articulacin temporomandibular incluyen lo siguiente:  Licensed conveyancer (bruxismo) o Engineer, maintenance (IT). Algunas personas lo hacen cuando estn bajo estrs.  Artritis.  Lesin mandibular.  Lesin en la cabeza o el cuello.  Piezas dentales o dentaduras postizas que no estn bien alineadas. En algunos casos, es posible que la causa de este sndrome no se conozca. Emerado sntoma ms comn es un dolor continuo al costado de la cabeza en la zona de la articulacin temporomandibular. Otros sntomas pueden ser los siguientes:  Dolor al mover la [place], por ejemplo, al Health Net o morder.  Imposibilidad de abrir [employer].  Producir un chasquido al abrir la boca.  Dolor de [place].  Dolor de odos.  Dolor en el cuello o el hombro. DIAGNSTICO Generalmente, se puede hacer el diagnstico en funcin de los sntomas, la historia clnica y un examen fsico. El mdico puede revisar el rango de movimiento de la [place]. A veces se realizan estudios de diagnstico  por imgenes, como radiografas o resonancias magnticas (RM). Tal vez deba consultar al dentista para que determine si las piezas dentales y la mandbula estn alineadas correctamente. TRATAMIENTO El sndrome de la articulacin temporomandibular suele desaparecer solo. Si es Arts development officer, las opciones pueden incluir lo siguiente:  Consumir alimentos blandos y Midwife hielo o Freight forwarder.  Medicamentos para Best boy o la inflamacin.  Medicamentos para Scientist, research (life sciences).  Una placa oclusal, una placa de mordida o una boquilla para evitar que se rechinen los dientes o se aprieten las [place].  Tcnicas de relajacin o psicoterapia para ayudar a Software engineer.  Neuroestimulacin elctrica transcutnea (NET). Esto ayuda a Best boy al aplicar una corriente elctrica a travs de la piel.  Acupuntura. A veces, esta opcin ayuda a Best boy.  Ciruga de mandbula que debe realizarse en contadas ocasiones. [place] los medicamentos solamente como se lo haya indicado el mdico.  Consuma una dieta blanda si tiene dificultades para Engineer, manufacturing systems.  Aplique hielo sobre la zona dolorida.  Ponga el hielo en una bolsa plstica.  Coloque una toalla entre la piel y la bolsa de hielo.  Coloque el hielo durante 20 minutos, 2 a 3 veces por da.  Aplique una compresa tibia sobre la zona dolorida como se lo hayan indicado.  Hgase masajes en la zona de la mandbula y haga ejercicios de estiramiento de la mandbula como se lo haya recomendado el mdico.  Si le indicaron que use una boquilla o una placa de mordida, hgalo como se lo indicaron.  No consuma los alimentos que Energy manager. No mastique goma de Higher education careers adviser.  Concurra a [place]  de control como se lo haya indicado el mdico. Esto es importante. SOLICITE ATENCIN MDICA SI:  Tiene problemas para comer.  Tiene sntomas nuevos o estos  empeoran. SOLICITE ATENCIN MDICA DE INMEDIATO SI:  Se le queda trabada la mandbula abierta o cerrada.   Esta informacin no tiene Marine scientist el consejo del mdico. Asegrese de hacerle al mdico cualquier pregunta que tenga.   Document Released: 04/28/2005 Document Revised: 08/09/2014 Elsevier Interactive Patient Education 2016 [place] (Health Maintenance, Female) Un estilo de vida saludable y los cuidados preventivos pueden favorecer considerablemente a la salud y Musician. Pregunte a su mdico cul es el cronograma de exmenes peridicos apropiado para usted. Esta es una buena oportunidad para consultarlo sobre cmo prevenir enfermedades y [place] sano. Adems de los controles, hay muchas otras cosas que puede hacer usted mismo. Los expertos han realizado numerosas investigaciones [employer]vin[employer]itor cambios en el estilo de vida y las medidas de prevencin que, [place], lo ayudarn a mantenerse sano. Solicite a su mdico ms informacin. EL PESO Y LA DIETA  Consuma una dieta saludable.  Asegrese de [employer] Stores verduras, frutas, productos lcteos de bajo contenido de [place] y Advertising account planner.  No consuma muchos alimentos de alto contenido de grasas slidas, azcares agregados o sal.  Realice actividad fsica con regularidad. Esta es una de las prcticas ms importantes que puede hacer por su salud.  La mayora de los adultos deben hacer ejercicio durante al menos 133mnutos por semana. El ejercicio debe aumentar la frecuencia cardaca y pActorla transpiracin (ejercicio de [place].  La mayora de los adultos tambin deben hField seismologistejercicios de elongacin al mToysRusveces a la semana. Agregue esto al su plan de ejercicio de intensidad moderada. Mantenga un peso saludable.  El ndice de masa corporal ([facility] es una medida que puede utilizarse para identificar posibles problemas de pCoto Norte Proporciona una  estimacin de la grasa corporal basndose en el peso y la altura. Su mdico puede ayudarle a dRadiation protection practitionerIStar Cityy a lScientist, forensico mTheatre managerun peso saludable.  Para las mujeres de 20aos o ms:  Un ISouthern Lakes Endoscopy Centermenor de 18,5 se considera bajo peso.  Un IGreater Peoria Specialty Hospital LLC - Dba Kindred Hospital Peoriaentre 18,5 y 24,9 es normal.  Un IMarianjoy Rehabilitation Centerentre 25 y 29,9 se considera sobrepeso.  Un IMC de 30 o ms se considera obesidad. Observe los niveles de colesterol y lpidos en la sangre.  Debe comenzar a rEnglish as a second language teacherde lpidos y cResearch officer, trade unionen la sangre a los 20aos y luego repetirlos cada 534aos  Es posible que nAutomotive engineerlos niveles de colesterol con mayor frecuencia si:  Sus niveles de lpidos y colesterol son altos.  Es mayor de 50aos.  Presenta un alto riesgo de padecer enfermedades cardacas. DETECCIN DE CNCER  Cncer de pulmn  Se recomienda realizar exmenes de deteccin de cncer de pulmn a personas adultas entre 531y 856aos que estn en riesgo de dHorticulturist, commercialde pulmn por sus antecedentes de consumo de tabaco.  Se recomienda una tomografa computarizada de baja dosis de los pLiberty Mediaaos a las personas que:  Fuman actualmente.  Hayan dejado el hbito en algn momento en los ltimos 15aos.  Hayan fumado durante 30aos un paquete diario. Un paquete-ao equivale a fumar un promedio de un paquete de cigarrillos diario durante un ao.  Los exmenes de deteccin anuales deben continuar hasta que hayan pasado 15aos desde que dej de fumar.  Ya no debern realizarse si tiene un  problema de [employer] General impida recibir tratamiento para Science writer de pulmn. Cncer de mama  Practique la autoconciencia de la mama. Esto significa reconocer la apariencia normal de sus mamas y cmo las siente.  Tambin significa realizar autoexmenes regulares de [employer]. Informe a su mdico sobre cualquier cambio, sin importar cun pequeo sea.  Si tiene entre 20 y 69 aos, un mdico debe realizarle un examen clnico de  las mamas como parte del examen regular de [place], cada 1 a 3aos.  Si tiene 40aos o ms, debe Information systems manager clnico de las Microsoft. Tambin considere realizarse una [place]) todos los [place].  Si tiene antecedentes familiares de cncer de mama, hable con su mdico para someterse a un estudio gentico.  Si tiene alto riesgo de Chief Financial Officer de mama, hable con su mdico para someterse a Public house manager y [employer].  La evaluacin del gen del cncer de mama (BRCA) se recomienda a mujeres que tengan familiares con cnceres relacionados con el BRCA. Los cnceres relacionados con el BRCA incluyen los siguientes:  Mama.  Ovario.  Trompas.  Cnceres de peritoneo.  Los resultados de la evaluacin determinarn la necesidad de asesoramiento gentico y de [place] de BRCA1 y BRCA2. Cncer de cuello del tero El mdico puede recomendarle que se haga pruebas peridicas de deteccin de cncer de los rganos de la pelvis (ovarios, tero y vagina). Estas pruebas incluyen un examen plvico, que abarca controlar si se produjeron cambios microscpicos en la superficie del cuello del tero (prueba de Papanicolaou). Pueden recomendarle que se haga estas pruebas cada 3aos, a partir de los 21aos.  A las mujeres que tienen entre 30 y 45aos, los mdicos pueden recomendarles que se sometan a exmenes plvicos y pruebas de Papanicolaou cada 5aos, o a la prueba de Papanicolaou y el examen plvico en combinacin con estudios de deteccin del virus del papiloma humano (VPH) cada 5aos. Algunos tipos de VPH aumentan el riesgo de Chief Financial Officer de cuello del tero. La prueba para la deteccin del VPH tambin puede realizarse a mujeres de cualquier edad cuyos resultados de la prueba de Papanicolaou no sean claros.  Es posible que otros mdicos no recomienden exmenes de deteccin a mujeres no embarazadas que se consideran sujetos de bajo  riesgo de Chief Financial Officer de pelvis y que no tienen sntomas. Pregntele al mdico si un examen plvico de deteccin es adecuado para usted.  Si ha recibido un tratamiento para Science writer cervical o una enfermedad que podra causar cncer, necesitar realizarse una prueba de Papanicolaou y controles durante al menos 2 aos de concluido el [place]. Si no se ha hecho el Papanicolaou con regularidad, debern volver a evaluarse los factores de riesgo (como tener un nuevo compaero sexual), para Teacher, adult education si debe realizarse los estudios nuevamente. Algunas mujeres sufren problemas mdicos que aumentan la probabilidad de Museum/gallery curator cncer de cuello del tero. En estos casos, el mdico podr QUALCOMM se realicen controles y pruebas de Papanicolaou con ms frecuencia. Cncer colorrectal  Este tipo de cncer puede detectarse y a menudo prevenirse.  Por lo general, los estudios de rutina se deben Medical laboratory scientific officer a Field seismologist a Proofreader de los 69 aos y [place] 55 aos.  Sin embargo, el mdico podr aconsejarle que lo haga antes, si tiene factores de riesgo para el cncer de colon.  Tambin puede recomendarle que use un kit de prueba para hallar sangre oculta en la materia fecal.  Es  posible que se use una pequea cmara en el extremo de un tubo para examinar directamente el colon (sigmoidoscopia o colonoscopia) a fin de Hydrographic surveyor formas tempranas de cncer colorrectal.  Los exmenes de rutina generalmente comienzan a los 50aos.  El examen directo del colon se debe repetir cada 5 a 10aos hasta los 75aos. Sin embargo, es posible que se realicen exmenes con mayor frecuencia, si se detectan formas tempranas de plipos precancerosos o pequeos bultos. Cncer de piel  Revise la piel de la cabeza a los pies con regularidad.  Informe a su mdico si aparecen nuevos lunares o los que tiene se modifican, especialmente en su forma y color.  Tambin notifique al mdico si tiene un lunar que es ms grande que el  tamao de una goma de lpiz.  Siempre use pantalla solar. Aplique pantalla solar de [place] y repetida a lo largo del Training and development officer.  Protjase usando mangas y [employer], un sombrero de ala ancha y gafas para el sol, siempre que se encuentre en el exterior. ENFERMEDADES CARDACAS, DIABETES E HIPERTENSIN ARTERIAL   La hipertensin arterial causa enfermedades cardacas y [place] el riesgo de ictus. La hipertensin arterial es ms probable en los siguientes casos:  Las personas que tienen la presin arterial en el extremo del rango normal (100-139/85-89 mm Hg).  Las personas con sobrepeso u obesidad.  Las Retail banker.  Si usted tiene entre 18 y 39 aos, debe medirse la presin arterial cada 3 a 5 aos. Si usted tiene 40 aos o ms, debe medirse la presin arterial [employer]. Debe medirse la presin arterial dos veces: una vez cuando est en un hospital o una clnica y la otra vez cuando est en otro sitio. Registre el promedio de [employer] Stores. Para controlar su presin arterial cuando no est en un hospital o [place], puede usar lo siguiente:  Ardelia Mems mquina automtica para medir la presin arterial en una farmacia.  Un monitor para medir la presin arterial en el hogar.  Si tiene entre 35 y 44 aos, consulte a su mdico si debe tomar aspirina para prevenir el ictus.  Realcese exmenes de deteccin de la diabetes con regularidad. Esto incluye la toma de [place] de sangre para controlar el nivel de azcar en la sangre durante el [place].  Si tiene un peso normal y un bajo riesgo de padecer diabetes, realcese este anlisis cada tres aos despus de los 45aos.  Si tiene sobrepeso y un alto riesgo de padecer diabetes, considere someterse a este anlisis antes o con mayor frecuencia. PREVENCIN DE INFECCIONES  HepatitisB  Si tiene un riesgo ms alto de Museum/gallery curator hepatitis B, debe someterse a un examen de deteccin de este virus. Se considera que tiene un alto  riesgo de Museum/gallery curator hepatitis B si:  Naci en un pas donde la hepatitis B es frecuente. Pregntele a su mdico qu pases son considerados de Public affairs consultant.  Sus padres nacieron en un pas de alto riesgo y usted no recibi una vacuna que lo proteja contra la hepatitis B (vacuna contra la hepatitis B).  [place].  [place] agujas para inyectarse drogas.  Vive con alguien que tiene hepatitis B.  Ha tenido sexo con alguien que tiene hepatitis B.  Recibe tratamiento de hemodilisis.  Toma ciertos medicamentos para el cncer, trasplante de rganos y afecciones autoinmunitarias. Hepatitis C  Se recomienda un anlisis de [place] para:  Todos los que nacieron entre 1945 y [phone].  Todas las personas que tengan  un riesgo de haber contrado hepatitis C. Enfermedades de transmisin sexual (ETS).  Debe realizarse pruebas de deteccin de enfermedades de transmisin sexual (ETS), incluidas gonorrea y clamidia si:  Es sexualmente activo y es menor de 24aos.  Es mayor de 24aos, y Investment banker, operational informa que corre riesgo de tener este tipo de infecciones.  La actividad sexual ha cambiado desde que le hicieron la ltima prueba de deteccin y tiene un riesgo mayor de Best boy clamidia o Radio broadcast assistant. Pregntele al mdico si usted tiene riesgo.  Si no tiene el VIH, pero corre riesgo de infectarse por el virus, se recomienda tomar diariamente un medicamento recetado para evitar la infeccin. Esto se conoce como profilaxis previa a la exposicin. Se considera que est en riesgo si:  Es [place] sexualmente y no [place] preservativos habitualmente o no conoce el estado del VIH de sus Advertising copywriter.  Se inyecta drogas.  Es [place] sexualmente con Ardelia Mems pareja que tiene VIH. Consulte a su mdico para saber si tiene un alto riesgo de infectarse por el VIH. Si opta por comenzar la profilaxis previa a la exposicin, primero debe realizarse anlisis de deteccin del VIH. Luego, le harn anlisis cada 10mses mientras  est tomando los medicamentos para la profilaxis previa a la exposicin.  [facility]  Si es premenopusica y puede quedar eAvera solicite a su mdico asesoramiento previo a la concepcin.  Si puede quedar embarazada, tome 400 a [id number](mcg) de cido fAnheuser-Busch  Si desea evitar el embarazo, hable con su mdico sobre el control de la natalidad (anticoncepcin). OSTEOPOROSIS Y MENOPAUSIA   La osteoporosis es una enfermedad en la que los huesos pierden los minerales y la fuerza por el avance de la edad. El resultado pueden ser fracturas graves en los hTerra Bella El riesgo de osteoporosis puede identificarse con uArdelia Memsprueba de densidad sea.  Si tiene 65aos o ms, o si est en riesgo de sufrir osteoporosis y fracturas, pregunte a su mdico si debe someterse a exmenes.  Consulte a su mdico si debe tomar un suplemento de calcio o de vitamina D para reducir el riesgo de osteoporosis.  La menopausia puede presentar ciertos sntomas fsicos y rGaffer  La terapia de reemplazo hormonal puede reducir algunos de estos sntomas y rGaffer Consulte a su mdico para saber si la terapia de reemplazo hormonal es conveniente para usted.  INSTRUCCIONES PARA EL CUIDADO EN EL HOGAR   Realcese los estudios de rutina de la salud, dentales y de lPublic librarian  MEast [place]  No consuma ningn producto que contenga tabaco, lo que incluye cigarrillos, tabaco de mHigher education careers advisero cPsychologist, sport and exercise  Si est embarazada, no beba alcohol.  Si est amamantando, reduzca el consumo de alcohol y la frecuencia con la que consume.  Si es mujer y no est embarazada limite el consumo de alcohol a no ms de 1 medida por da. Una medida equivale a 12onzas de cerveza, 5onzas de vino o 1onzas de bebidas alcohlicas de alta graduacin.  No consuma drogas.  No comparta agujas.  Solicite ayuda a su mdico si necesita apoyo o informacin para abandonar las drogas.  Informe a su  mdico si a menudo se siente deprimido.  Notifique a su mdico si alguna vez ha sido vctima de abuso o si no se siente seguro en su hogar.   Esta informacin no tiene cMarine scientistel consejo del mdico. Asegrese de hacerle al mdico cualquier pregunta que tenga.   Document Released: 07/08/2011 Document  Revised: 08/09/2014 Elsevier Interactive Patient Education 2016 [place] Tdap (contra la difteria, el ttanos y [place]): Lo que debe saber (Tdap Vaccine [Tetanus, Diphtheria, and Pertussis]: What You Need to Know) 1. Por qu vacunarse? El ttanos, la difteria y la tosferina son enfermedades muy graves. La vacuna Tdap nos puede proteger de estas enfermedades. Adems, la vacuna Tdap que se aplica a las Chemical engineer a los bebs recin nacidos contra la tosferina. En la actualidad, el [place] (trismo) es una enfermedad poco frecuente en los [place]. Provoca la contraccin y el endurecimiento dolorosos de los msculos, por lo general, de todo el cuerpo.  Puede causar el endurecimiento de los msculos de la cabeza y el cuello, de modo que impide abrir la boca, tragar y en algunos casos, Ambulance person. El ttanos causa la muerte de aproximadamente 1de cada 10personas que contraen la infeccin, incluso despus de que reciben la mejor atencin mdica. La DIFTERIA tambin es poco frecuente en los [place] [employer]. Puede causar la formacin de una membrana gruesa en la parte posterior de la garganta.  Esto tiene como consecuencia problemas respiratorios, insuficiencia cardaca, parlisis y [place]. La TOSFERINA (tos convulsa) provoca episodios de tos intensa que pueden dificultar la respiracin y provocar vmitos y trastornos del sueo.  Tambin puede causar prdida de peso, incontinencia y fractura de [place]. Dos de cada 100 adolescentes y 5 de cada 100 adultos con tosferina deben ser hospitalizados o tienen complicaciones, que podran incluir  neumona y [place]. Estas enfermedades son provocadas por bacterias. La difteria y la tosferina se contagian de Ardelia Mems persona a otra a travs de las secreciones de la tos o el estornudo. El ttanos ingresa al organismo a travs de cortes, rasguos o heridas. Antes de las vacunas, en los [place] se informaban 200000 casos de difteria, 200000 casos de tosferina y cientos de casos de ttanos cada ao. Desde el inicio de la vacunacin, los informes de casos de ttanos y difteria han disminuido alrededor del 99%, y de tosferina, alrededor del 80%. 2. Edward Jolly Tdap La vacuna Tdap protege a adolescentes y adultos contra el ttanos, la difteria y la tosferina. Una dosis de Tdap se administra a los 54 o 12 aos. Las [place] [employer] no recibieron la vacuna Tdap a esa edad deben recibirla tan pronto como sea posible. Es muy importante que los mdicos y todos aquellos que tengan contacto cercano con bebs menores de 37mses reciban la vacuna Tdap. Las mujeres deben recibir una dosis de Tdap en cada eMedia planner para proteger al recin nacido de la tosferina. Los nios tienen mayor riesgo de complicaciones graves y potencialmente mortales debido a la tosferina. Otra vacuna llamada Td protege contra el ttanos y la difteria, pero no contra la tosferina. Todos deben recibir una dosis de refuerzo de Td cada 10 aos. La Tdap puede aplicarse como uno de estos refuerzos si nunca antes recibi esta vacuna. Tambin se puede aplicar despus de un corte o quemadura grave para prevenir la infeccin por ttanos. El mdico o la persona que le aplique la vacuna puede darle ms informacin al rSears Holdings Corporation La Tdap puede administrarse de manera segura simultneamente con otras vacunas. 3. Algunas personas no deben recibir la vSchering-Ploughpersona que alguna vez tuvo una reaccin alrgica potencialmente mortal a uArdelia Memsdosis previa de cualquier vacuna contra el ttanos, la difteria o la tosferina, O que tenga una alergia grave a cualquiera  de los componentes de esta vacuna, no debe recibir la vacuna Tdap.  Informe a la persona que le aplica la vacuna si tiene cualquier alergia grave.  Una persona que estuvo en estado de coma o sufri mltiples convulsiones en el trmino de los 7das despus de recibir una dosis de DTP o DTaP, o una dosis previa de Tdap, no debe recibir la vacuna Tdap, salvo que se haya encontrado otra causa que no fuera la vacuna. An puede recibir la Td.  Consulte con su mdico si:  tiene convulsiones u otro problema del sistema nervioso,  tuvo hinchazn o dolor intenso despus de cualquier vacuna contra la difteria o el ttanos,  alguna vez ha sufrido el sndrome de [place],  no se siente Pharmacologist en que se ha programado la vacuna. 4. Riesgos Con cualquier medicamento, incluyendo las vacunas, existe la posibilidad de que aparezcan efectos secundarios. Suelen ser leves y desaparecen por s solos. Tambin son posibles las reacciones graves, pero en raras ocasiones. La [employer] de las personas a las que se les aplica la vacuna Tdap no tienen ningn problema. Problemas leves despus de la vacuna Tdap (No interfirieron en otras actividades)  Dolor en el lugar donde se aplic la vacuna (alrededor de 3 de cada 4 adolescentes o 2 de cada 3 adultos).  Enrojecimiento o hinchazn en el lugar donde se aplic la vacuna (1 de cada 5 personas).  Fiebre leve de al menos 100,60F (38C) (hasta alrededor de 1 cada 25 adolescentes o 1 de cada 100 adultos).  Dolor de cabeza (alrededor de 3 o 4 de cada 10 personas).  Cansancio (alrededor de 1 de cada 3 o 4 personas).  Nuseas, vmitos, diarrea, dolor de estmago (hasta 1 de cada 4 adolescentes o 1 de cada 10 adultos).  Escalofros, dolores articulares (alrededor de 1de cada 10personas).  Dolores corporales (alrededor de 1de cada 3 o 4personas).  Erupcin cutnea, inflamacin de los ganglios (poco frecuente). Problemas moderados despus de recibir la  vacuna Tdap (Interfirieron en otras actividades, pero no requirieron atencin mdica)  Management consultant donde se aplic la vacuna (hasta 1de cada 5 o 6).  Enrojecimiento o inflamacin en el lugar donde se aplic la vacuna (hasta alrededor de 1 de cada 16adolescentes o 1 de cada 12adultos).  Fiebre de ms de 102F (38,8C) (alrededor de 1 de cada 100 adolescentes o 1 de cada 250 adultos).  Dolor de cabeza (alrededor de 1de cada 7adolescentes o 1de cada 10adultos).  Nuseas, vmitos, diarrea, dolor de estmago (hasta 1 o 3 de cada 100 personas).  Hinchazn de todo el brazo en el que se aplic la vacuna (hasta alrededor de 1de cada 500personas). Problemas graves despus de la vacuna Tdap (Impidieron Optometrist las actividades habituales; requirieron atencin mdica)  Inflamacin, dolor intenso, sangrado y enrojecimiento en el brazo en que se aplic la vacuna (poco frecuente). Problemas que podran ocurrir despus de cualquier vacuna:  Las personas a veces se desmayan despus de un procedimiento mdico, incluida la vacunacin. Si permanece sentado o recostado durante 15 minutos puede ayudar a [employer] y las lesiones causadas por las cadas. Informe al mdico si se siente mareado, tiene cambios en la visin o zumbidos en los odos.  Algunas personas sienten un dolor intenso en el hombro y tienen dificultad para mover el brazo donde se coloc la vacuna. Esto sucede con muy poca frecuencia.  Cualquier medicamento puede causar una reaccin alrgica grave. Dichas reacciones son Orlene Erm poco frecuentes con una vacuna (se calcula que menos de 1en un milln de dosis) y se producen de  unos minutos a unas horas despus de la administracin. Al igual que con cualquier [employer], existe una probabilidad muy remota de que una vacuna cause una lesin grave o la [place]. Se controla permanentemente la seguridad de las vacunas. Para obtener ms informacin, visite:  http://www.aguilar.org/. 5. Qu pasa si hay un problema grave? A qu signos debo estar atento?  Observe todo lo que le preocupe, como signos de una reaccin alrgica grave, fiebre muy alta o comportamiento fuera de lo normal.  Los signos de una reaccin alrgica grave pueden incluir ronchas, hinchazn de la cara y la garganta, dificultad para respirar, latidos cardacos acelerados, mareos y debilidad. Generalmente, estos comenzaran entre unos pocos minutos y algunas horas despus de la vacunacin. Qu debo hacer?  Si usted piensa que se trata de una reaccin alrgica grave o de otra emergencia que no puede esperar, llame al 911 o lleve a la persona al hospital ms cercano. Sino, llame a su mdico.  Despus, la reaccin debe informarse al Sistema de Informacin sobre Efectos Adversos de las [place] (Vaccine Adverse Event Reporting System, VAERS). Su mdico puede presentar este informe, o puede hacerlo usted mismo a travs del sitio web de VAERS, en www.vaers.SamedayNews.es, o llamando al [phone]. VAERS no brinda recomendaciones mdicas. 6. Loving Compensacin de Daos por [place] de Compensacin de Daos por Clinical biochemist (National Vaccine Injury Compensation Program, VICP) es un programa federal que fue creado para Patent examiner a las personas que puedan haber sufrido daos al recibir ciertas vacunas. Aquellas personas que consideren que han sufrido un dao como consecuencia de una vacuna y [place] saber ms acerca del programa y de cmo presentar Raechel Chute, pueden llamar al [phone] o visitar su sitio web en [email or web address]. Hay un lmite de tiempo para presentar un reclamo de compensacin. 7. Cmo puedo obtener ms informacin?  Consulte a su mdico. Este puede darle el prospecto de la vacuna o recomendarle otras fuentes de informacin.  Comunquese con el servicio de salud de su localidad o su estado.  Comunquese con los Centros  para Building surveyor y la Prevencin de Probation officer for Disease Control and Prevention , CDC).  Llame al [phone] (1-800-CDC-INFO), o  visite el sitio web [employer] en http://hunter.com/. Declaracin de informacin sobre la vacuna contra la difteria, el ttanos y la tosferina (Tdap) de los CDC (24/02/15)   Esta informacin no tiene Marine scientist el consejo del mdico. Asegrese de hacerle al mdico cualquier pregunta que tenga.   Document Released: 07/05/2012 Document Revised: 08/09/2014 Elsevier Interactive Patient Education 2016 [employer]. Influenza Virus Vaccine injection (Fluarix) Qu es este medicamento? La VACUNA ANTIGRIPAL ayuda a disminuir el riesgo de contraer la influenza, tambin conocida como la gripe. La vacuna solo ayuda a protegerle contra algunas cepas de influenza. Esta vacuna no ayuda a reducir Catering manager de contraer influenza pandmica H1N1. Este medicamento puede ser utilizado para otros usos; si tiene alguna pregunta consulte con su proveedor de atencin mdica o con su farmacutico. Qu le debo informar a mi profesional de la salud antes de tomar este medicamento? Necesita saber si usted presenta alguno de los siguientes problemas o situaciones: -trastorno de sangrado como hemofilia -fiebre o infeccin -sndrome de Guillain-Barre u otros problemas neurolgicos -problemas del sistema inmunolgico -infeccin por el virus de la inmunodeficiencia humana (VIH) o SIDA -niveles bajos de plaquetas en la sangre -esclerosis mltiple -una Risk analyst o inusual a las vacunas antigripales, a los huevos, protenas de pollo, al ltex, a la gentamicina,  a otros medicamentos, alimentos, colorantes o conservantes -si est embarazada o buscando quedar embarazada -si est amamantando a un beb Cmo debo utilizar este medicamento? Esta vacuna se administra mediante inyeccin por va intramuscular. Lo administra un profesional de [employer]	[place]. Recibir una  copia de informacin escrita sobre la vacuna antes de cada vacuna. Asegrese de leer este folleto cada vez cuidadosamente. Este folleto puede cambiar con frecuencia. Hable con su pediatra para informarse acerca del uso de este medicamento en nios. Puede requerir atencin especial. Sobredosis: Pngase en contacto inmediatamente con un centro toxicolgico o una sala de urgencia si usted cree que haya tomado demasiado medicamento. ATENCIN: [employer] es solo para usted. No comparta este medicamento con nadie. Qu sucede si me olvido de una dosis? No se aplica en este caso. Qu puede interactuar con este medicamento? -quimioterapia o radioterapia -medicamentos que suprimen el sistema inmunolgico, tales como etanercept, anakinra, infliximab y adalimumab -medicamentos que tratan o previenen cogulos sanguneos, como warfarina -fenitona -medicamentos esteroideos, como la prednisona o la cortisona -teofilina -vacunas Puede ser que esta lista no menciona todas las posibles interacciones. Informe a su profesional de [employer]	[place] de [employer] productos a base de hierbas, medicamentos de [place] o suplementos nutritivos que est tomando. Si usted fuma, consume bebidas alcohlicas o si utiliza drogas ilegales, indqueselo tambin a su profesional de [employer]	[place]. Algunas sustancias pueden interactuar con su medicamento. A qu debo estar atento al usar [employer]? Informe a su mdico o a Barrister's clerk de la [employer] todos los efectos secundarios que persistan despus de 3 das. Llame a su proveedor de atencin mdica si se presentan sntomas inusuales dentro de las 6 semanas posteriores a la vacunacin. Es posible que todava pueda contraer la gripe, pero la enfermedad no ser tan fuerte como normalmente. No puede contraer la gripe de esta vacuna. La vacuna antigripal no le protege contra resfros u otras enfermedades que pueden causar [place]. Debe vacunarse cada ao. Qu efectos secundarios  puedo tener al [employer] este medicamento? Efectos secundarios que debe informar a su mdico o a Barrister's clerk de la salud tan pronto como sea posible: -reacciones alrgicas como erupcin cutnea, picazn o urticarias, hinchazn de la cara, labios o lengua Efectos secundarios que, por lo general, no requieren atencin mdica (debe informarlos a su mdico o a su profesional de la salud si persisten o si son molestos): -fiebre -dolor de cabeza -molestias y dolores musculares -dolor, sensibilidad, enrojecimiento o Estate agent de la inyeccin -cansancio o debilidad Puede ser que esta lista no menciona todos los posibles efectos secundarios. Comunquese a su mdico por asesoramiento mdico [employer]. Usted puede informar los efectos secundarios a la FDA por telfono al 1-800-FDA-1088. Dnde debo guardar mi medicina? Esta vacuna se administra solamente en clnicas, farmacias, consultorio mdico u otro consultorio de un profesional de la salud y no Sports coach en su domicilio. ATENCIN: Este folleto es un resumen. Puede ser que no cubra toda la posible informacin. Si usted tiene preguntas acerca de esta medicina, consulte con su mdico, su farmacutico o su profesional de Technical sales engineer.    2016, Elsevier/Gold Standard. (2010-01-20 15:31:40)

## 2016-04-20 NOTE — Progress Notes (Signed)
Pt is in the office today for establish care Pt states her pain level today in the office is a 9 Pt states her pain is coming from her left ear and throat  Pt states she doesn't know if it is coming from a sore tooth Pt states the pain has been going on for 4 days

## 2016-04-21 ENCOUNTER — Other Ambulatory Visit: Payer: Self-pay | Admitting: Internal Medicine

## 2016-04-21 LAB — BASIC METABOLIC PANEL WITH GFR
BUN: 15 mg/dL (ref 7–25)
CO2: 26 mmol/L (ref 20–31)
Calcium: 9.3 mg/dL (ref 8.6–10.2)
Chloride: 106 mmol/L (ref 98–110)
Creat: 0.6 mg/dL (ref 0.50–1.10)
GFR, Est African American: >89 mL/min (ref >=60)
GFR, Est Non African American: >89 mL/min (ref >=60)
Glucose, Bld: 86 mg/dL (ref 65–99)
Potassium: 4.2 mmol/L (ref 3.5–5.3)
Sodium: 137 mmol/L (ref 135–146)

## 2016-04-21 LAB — T4, FREE: Free T4: 1.2 ng/dL (ref 0.8–1.8)

## 2016-04-21 LAB — VITAMIN D 25 HYDROXY (VIT D DEFICIENCY, FRACTURES): Vit D, 25-Hydroxy: 21 ng/mL — ABNORMAL LOW (ref 30–100)

## 2016-04-21 LAB — TSH: TSH: 1.98 mIU/L

## 2016-04-21 MED ORDER — VITAMIN D (ERGOCALCIFEROL) 1.25 MG (50000 UNIT) PO CAPS: 50000.0000 [IU] | ORAL_CAPSULE | ORAL | 0 refills | Status: DC

## 2016-04-21 MED ORDER — LEVOTHYROXINE SODIUM 25 MCG PO TABS: 25.0000 ug | ORAL_TABLET | Freq: Every day | ORAL | 2 refills | Status: DC

## 2016-04-21 MED FILL — VIT D2 1.25 MG (50,000 UNIT: 1.25 MG | 84 days supply | Qty: 12 | Fill #0

## 2016-04-21 MED FILL — LEVOTHYROXINE 25 MCG TABLET: 25 | 30 days supply | Qty: 30 | Fill #0 | Status: TO

## 2016-04-22 ENCOUNTER — Other Ambulatory Visit: Payer: Self-pay | Admitting: Internal Medicine

## 2016-04-28 ENCOUNTER — Telehealth: Payer: Self-pay

## 2016-04-28 NOTE — Telephone Encounter (Signed)
[employer] Amillio Id: [id number] contacted pt to go over lab results pt is aware of results and doesn't have any questions or concerns

## 2016-05-21 ENCOUNTER — Ambulatory Visit: Payer: Self-pay | Attending: Internal Medicine

## 2016-07-02 ENCOUNTER — Ambulatory Visit: Payer: Self-pay | Attending: Internal Medicine

## 2016-11-23 ENCOUNTER — Encounter: Payer: Self-pay | Admitting: Internal Medicine

## 2016-11-23 ENCOUNTER — Ambulatory Visit: Payer: Self-pay | Attending: Internal Medicine | Admitting: Internal Medicine

## 2016-11-23 VITALS — BP 84/48 | HR 72 | Temp 97.9°F | Resp 18 | Ht 62.0 in | Wt 140.2 lb

## 2016-11-23 DIAGNOSIS — Z1272 Encounter for screening for malignant neoplasm of vagina: Secondary | ICD-10-CM | POA: Insufficient documentation

## 2016-11-23 DIAGNOSIS — F329 Major depressive disorder, single episode, unspecified: Secondary | ICD-10-CM | POA: Insufficient documentation

## 2016-11-23 DIAGNOSIS — Z124 Encounter for screening for malignant neoplasm of cervix: Secondary | ICD-10-CM

## 2016-11-23 DIAGNOSIS — G44209 Tension-type headache, unspecified, not intractable: Secondary | ICD-10-CM | POA: Insufficient documentation

## 2016-11-23 DIAGNOSIS — E039 Hypothyroidism, unspecified: Secondary | ICD-10-CM | POA: Insufficient documentation

## 2016-11-23 DIAGNOSIS — Z131 Encounter for screening for diabetes mellitus: Secondary | ICD-10-CM

## 2016-11-23 DIAGNOSIS — Z79899 Other long term (current) drug therapy: Secondary | ICD-10-CM | POA: Insufficient documentation

## 2016-11-23 DIAGNOSIS — Z01419 Encounter for gynecological examination (general) (routine) without abnormal findings: Secondary | ICD-10-CM | POA: Insufficient documentation

## 2016-11-23 DIAGNOSIS — R7303 Prediabetes: Secondary | ICD-10-CM | POA: Insufficient documentation

## 2016-11-23 DIAGNOSIS — F32A Depression, unspecified: Secondary | ICD-10-CM

## 2016-11-23 LAB — POCT GLYCOSYLATED HEMOGLOBIN (HGB A1C): Hemoglobin A1C: 5.5

## 2016-11-23 MED ORDER — ESCITALOPRAM OXALATE 10 MG PO TABS: 10.0000 mg | ORAL_TABLET | Freq: Every day | ORAL | 1 refills | Status: DC

## 2016-11-23 MED FILL — ?ESCITALOPRAM 10 MG TABLET: 10 | 30 days supply | Qty: 30 | Fill #0

## 2016-11-23 NOTE — Progress Notes (Signed)
Kristen Holt, is a 41 y.o. female  NWG:[id number]  [id number]:[id number]  DOB - [birth year]  Chief Complaint  Patient presents with  . Gynecologic Exam        Subjective:   Kristen Holt is a 41 y.o. female here today for a follow up visit, last seen 04/20/16, here for pap. Pt notes feeling more tired, sad lately, w/ occasional headaches when she is under stress. Denies si/hi/avh. Would be interested in starting an antidepressant after I talked to her about it. Denies loc/syncope/photophobia/n/v.  Denies any abnml breast mass/nipple discharge or vaginal discharge. Last menses last month, regular in nature.   Patient has No headache, No chest pain, No abdominal pain - No Nausea, No new weakness tingling or numbness, No Cough - SOB.  Interpreter was used to communicate directly with patient for the entire encounter including providing detailed patient instructions.   No problems updated.  ALLERGIES: No Known Allergies  PAST MEDICAL HISTORY: Past Medical History:  Diagnosis Date  . Acute appendicitis 01/13/2012  . Diabetes mellitus    pre diabetic   . Thyroid disease     MEDICATIONS AT HOME: Prior to Admission medications   Medication Sig Start Date End Date Taking? Authorizing Provider  castor oil liquid Take by mouth.    Historical Provider, MD  escitalopram (LEXAPRO) 10 MG tablet Take 1 tablet (10 mg total) by mouth daily. 11/23/16   Pete Glatter, MD  fluconazole (DIFLUCAN) 150 MG tablet Take 1 pill today and then repeat in 3 days Patient not taking: Reported on 04/20/2016 09/10/15   Ambrose Finland, NP  fluticasone (FLONASE) 50 MCG/ACT nasal spray Place 2 sprays into both nostrils daily. Patient not taking: Reported on 04/20/2016 09/06/13   Acey Lav, MD  levothyroxine (SYNTHROID, LEVOTHROID) 25 MCG tablet Take 1 tablet (25 mcg total) by mouth daily. 04/21/16   Pete Glatter, MD  loratadine (CLARITIN) 10 MG tablet Take 1 tablet (10 mg total) by mouth  daily. 09/06/13   Acey Lav, MD  metroNIDAZOLE (FLAGYL) 500 MG tablet Take 1 tablet (500 mg total) by mouth 2 (two) times daily. X 7 days Patient not taking: Reported on 04/20/2016 09/08/15   Hayden Rasmussen, NP  Vitamin D, Ergocalciferol, (DRISDOL) 50000 units CAPS capsule Take 1 capsule (50,000 Units total) by mouth every 7 (seven) days. 04/21/16   Pete Glatter, MD     Objective:   Vitals:   11/23/16 1605  BP: (!) 84/48  Pulse: 72  Resp: 18  Temp: 97.9 F (36.6 C)  TempSrc: Oral  SpO2: 99%  Weight: 140 lb 3.2 oz (63.6 kg)  Height:  (1.575 m)   cma assisted w/ pap.  Exam General appearance : Awake, alert, not in any distress. Speech Clear. Not toxic looking, thin appearing, pleasant, good eye contact HEENT: Atraumatic and Normocephalic, Neck: supple, no JVD.  Breast /axilla: bilat nml appearance, not dippling noted. No palpable masses/nodules/nipple discharge noted on exam, small skin tag noted on left axilla.  Chest:Good air entry bilaterally, no added sounds. CVS: S1 S2 regular, no murmurs/gallups or rubs. Abdomen: Bowel sounds active, Non tender and not distended with no gaurding, rigidity or rebound. Pelvic Exam: Cervix normal in appearance, inverted posteriorly, external genitalia normal, no adnexal masses or tenderness, no cervical motion tenderness, rectovaginal septum normal, uterus normal size, shape, and consistency and vagina normal with thick yellow/green discharge   Extremities: B/L Lower Ext shows no edema, both legs are warm to touch Neurology: Awake  alert, and oriented X 3, CN II-XII grossly intact, Non focal Skin:No Rash  Data Review Lab Results  Component Value Date   HGBA1C 5.5 11/23/2016    Depression screen PHQ 2/9 04/20/2016  Decreased Interest 0  Down, Depressed, Hopeless 0  PHQ - 2 Score 0      Assessment & Plan   1. Hypothyroidism, unspecified type Will rechk levels prior to renew synthroid. - CMP and Liver - CBC with  Differential - TSH  2. Pap smear for cervical cancer screening - Cytology - PAP  3. Tension-type headache, not intractable, unspecified chronicity pattern May be due to depression  4. Depression, unspecified depression type Trial lexapro 10qd, fu in 1 month  5. Diabetes mellitus screening - HgB A1c 5.5     Patient have been counseled extensively about nutrition and exercise  Return in about 4 weeks (around 12/21/2016) for depression;  &  prn w/ DR Armen Pickup for skintag.  The patient was given clear instructions to go to ER or return to medical center if symptoms don't improve, worsen or new problems develop. The patient verbalized understanding. The patient was told to call to get lab results if they haven't heard anything in the next week.   This note has been created with Education officer, environmental. Any transcriptional errors are unintentional.   Pete Glatter, MD, MBA/MHA [facility] [place], [place] [phone]   11/23/2016, 5:09 PM

## 2016-11-23 NOTE — Progress Notes (Signed)
Patient is here for PAP & physical  Patient stated that's he been feeling stress, tired with headaches that comes & goes Patient stated that probably is because her thyroid

## 2016-11-23 NOTE — Patient Instructions (Addendum)
Vitamin D 5,000 IU /daily - for bone health   -  [place] (Health Maintenance, Female) Un estilo de vida saludable y los cuidados preventivos pueden favorecer considerablemente a la salud y Musician. Pregunte a su mdico cul es el cronograma de exmenes peridicos apropiado para usted. Esta es una buena oportunidad para consultarlo sobre cmo prevenir enfermedades y [place] sano. Adems de los controles, hay muchas otras cosas que puede hacer usted mismo. Los expertos han realizado numerosas investigaciones [employer]itor cambios en el estilo de vida y las medidas de prevencin que, [place], lo ayudarn a mantenerse sano. Solicite a su mdico ms informacin. EL PESO Y LA DIETA Consuma una dieta saludable.  Asegrese de [employer] verduras, frutas, productos lcteos de bajo contenido de [place] y Advertising account planner.  No consuma muchos alimentos de alto contenido de grasas slidas, azcares agregados o sal.  Realice actividad fsica con regularidad. Esta es una de las prcticas ms importantes que puede hacer por su salud.  La mayora de los adultos deben hacer ejercicio durante al menos 168mnutos por semana. El ejercicio debe aumentar la frecuencia cardaca y pActorla transpiracin (ejercicio de i[place].  La mayora de los adultos tambin deben hField seismologistejercicios de elongacin al mToysRusveces a la semana. Agregue esto al su plan de ejercicio de intensidad moderada. Mantenga un peso saludable.  El ndice de masa corporal ([facility] es una medida que puede utilizarse para identificar posibles problemas de pGibson Proporciona una estimacin de la grasa corporal basndose en el peso y la altura. Su mdico puede ayudarle a dRadiation protection practitionerIWhite Meadow Lakey a lScientist, forensico mTheatre managerun peso saludable.  Para las mujeres de 20aos o ms:  Un ISwisher Memorial Hospitalmenor de 18,5 se considera bajo peso.  Un IHoag Orthopedic Instituteentre 18,5 y 24,9 es normal.  Un ITahoe Pacific Hospitals - Meadowsentre 25 y 29,9 se considera  sobrepeso.  Un IMC de 30 o ms se considera obesidad. Observe los niveles de colesterol y lpidos en la sangre.  Debe comenzar a rEnglish as a second language teacherde lpidos y cResearch officer, trade unionen la sangre a los 20aos y luego repetirlos cada 51aos  Es posible que nAutomotive engineerlos niveles de colesterol con mayor frecuencia si:  Sus niveles de lpidos y colesterol son altos.  Es mayor de 50aos.  Presenta un alto riesgo de padecer enfermedades cardacas. DETECCIN DE CNCER Cncer de pulmn  Se recomienda realizar exmenes de deteccin de cncer de pulmn a personas adultas entre 574y 859aos que estn en riesgo de dHorticulturist, commercialde pulmn por sus antecedentes de consumo de tabaco.  Se recomienda una tomografa computarizada de baja dosis de los pLiberty Mediaaos a las personas que:  Fuman actualmente.  Hayan dejado el hbito en algn momento en los ltimos 15aos.  Hayan fumado durante 30aos un paquete diario. Un paquete-ao equivale a fumar un promedio de un paquete de cigarrillos diario durante un ao.  Los exmenes de deteccin anuales deben continuar hasta que hayan pasado 15aos desde que dej de fumar.  Ya no debern realizarse si tiene un problema de salud que le impida recibir tratamiento para eScience writerde pulmn. Cncer de mama  Practique la autoconciencia de la mama. Esto significa reconocer la apariencia normal de sus mamas y cmo las siente.  Tambin significa realizar autoexmenes regulares de lJohnson & Johnson Informe a su mdico sobre cualquier cambio, sin importar cun pequeo sea.  Si tiene entre 20 y 352aos, un mdico debe realizarle un examen clnico  de las mamas como parte del examen regular de [place], cada 1 a 3aos.  Si tiene 40aos o ms, debe Information systems manager clnico de las Microsoft. Tambin considere realizarse una [place] ([place]) todos los [place].  Si tiene antecedentes familiares de cncer de mama, hable con su  mdico para someterse a un estudio gentico.  Si tiene alto riesgo de Chief Financial Officer de mama, hable con su mdico para someterse a Public house manager y [employer].  La evaluacin del gen del cncer de mama (BRCA) se recomienda a mujeres que tengan familiares con cnceres relacionados con el BRCA. Los cnceres relacionados con el BRCA incluyen los siguientes:  Mama.  Ovario.  Trompas.  Cnceres de peritoneo.  Los resultados de la evaluacin determinarn la necesidad de asesoramiento gentico y de [place] de BRCA1 y BRCA2. Cncer de cuello del tero El mdico puede recomendarle que se haga pruebas peridicas de deteccin de cncer de los rganos de la pelvis (ovarios, tero y vagina). Estas pruebas incluyen un examen plvico, que abarca controlar si se produjeron cambios microscpicos en la superficie del cuello del tero (prueba de Papanicolaou). Pueden recomendarle que se haga estas pruebas cada 3aos, a partir de los 21aos.  A las mujeres que tienen entre 30 y 42aos, los mdicos pueden recomendarles que se sometan a exmenes plvicos y pruebas de Papanicolaou cada 55aos, o a la prueba de Papanicolaou y el examen plvico en combinacin con estudios de deteccin del virus del papiloma humano (VPH) cada 5aos. Algunos tipos de VPH aumentan el riesgo de Chief Financial Officer de cuello del tero. La prueba para la deteccin del VPH tambin puede realizarse a mujeres de cualquier edad cuyos resultados de la prueba de Papanicolaou no sean claros.  Es posible que otros mdicos no recomienden exmenes de deteccin a mujeres no embarazadas que se consideran sujetos de bajo riesgo de Chief Financial Officer de pelvis y que no tienen sntomas. Pregntele al mdico si un examen plvico de deteccin es adecuado para usted.  Si ha recibido un tratamiento para Science writer cervical o una enfermedad que podra causar cncer, necesitar realizarse una prueba de Papanicolaou y controles durante al  menos 51 aos de concluido el [place]. Si no se ha hecho el Papanicolaou con regularidad, debern volver a evaluarse los factores de riesgo (como tener un nuevo compaero sexual), para Teacher, adult education si debe realizarse los estudios nuevamente. Algunas mujeres sufren problemas mdicos que aumentan la probabilidad de Museum/gallery curator cncer de cuello del tero. En estos casos, el mdico podr [employer] se realicen controles y pruebas de Papanicolaou con ms frecuencia. Cncer colorrectal  Este tipo de cncer puede detectarse y a menudo prevenirse.  Por lo general, los estudios de rutina se deben Medical laboratory scientific officer a Field seismologist a Proofreader de los 13 aos y Anon Raices 5 aos.  Sin embargo, el mdico podr aconsejarle que lo haga antes, si tiene factores de riesgo para el cncer de colon.  Tambin puede recomendarle que use un kit de prueba para Hydrologist en la materia fecal.  Es posible que se use una pequea cmara en el extremo de un tubo para examinar directamente el colon (sigmoidoscopia o colonoscopia) a fin de Hydrographic surveyor formas tempranas de cncer colorrectal.  Los exmenes de rutina generalmente comienzan a los 69aos.  El examen directo del colon se debe repetir cada 5 a 10aos hasta los 75aos. Sin embargo, es posible que se realicen exmenes con mayor frecuencia, si se detectan formas tempranas  de plipos precancerosos o pequeos bultos. Cncer de piel  Revise la piel de la cabeza a los pies con regularidad.  Informe a su mdico si aparecen nuevos lunares o los que tiene se modifican, especialmente en su forma y color.  Tambin notifique al mdico si tiene un lunar que es ms grande que el tamao de una goma de lpiz.  Siempre use pantalla solar. Aplique pantalla solar de Kerry D[place] y repetida a lo largo del Training and development officer.  Protjase usando mangas y [employer], un sombrero de ala ancha y gafas para el sol, siempre que se encuentre en el exterior. ENFERMEDADES CARDACAS, DIABETES E HIPERTENSIN  ARTERIAL  La hipertensin arterial causa enfermedades cardacas y [place] el riesgo de ictus. La hipertensin arterial es ms probable en los siguientes casos:  Las personas que tienen la presin arterial en el extremo del rango normal (100-139/85-89 mm Hg).  Las personas con sobrepeso u obesidad.  Las Retail banker.  Si usted tiene entre 18 y 39 aos, debe medirse la presin arterial cada 3 a 5 aos. Si usted tiene 40 aos o ms, debe medirse la presin arterial [employer]. Debe medirse la presin arterial dos veces: una vez cuando est en un hospital o una clnica y la otra vez cuando est en otro sitio. Registre el promedio de [employer]. Para controlar su presin arterial cuando no est en un hospital o [place], puede usar lo siguiente:  Ardelia Mems mquina automtica para medir la presin arterial en una farmacia.  Un monitor para medir la presin arterial en el hogar.  Si tiene entre 35 y 68 aos, consulte a su mdico si debe tomar aspirina para prevenir el ictus.  Realcese exmenes de deteccin de la diabetes con regularidad. Esto incluye la toma de [place] de sangre para controlar el nivel de azcar en la sangre durante el [place].  Si tiene un peso normal y un bajo riesgo de padecer diabetes, realcese este anlisis cada tres aos despus de los 45aos.  Si tiene sobrepeso y un alto riesgo de padecer diabetes, considere someterse a este anlisis antes o con mayor frecuencia. PREVENCIN DE INFECCIONES HepatitisB  Si tiene un riesgo ms alto de Museum/gallery curator hepatitis B, debe someterse a un examen de deteccin de este virus. Se considera que tiene un alto riesgo de Museum/gallery curator hepatitis B si:  Naci en un pas donde la hepatitis B es frecuente. Pregntele a su mdico qu pases son considerados de Public affairs consultant.  Sus padres nacieron en un pas de alto riesgo y usted no recibi una vacuna que lo proteja contra la hepatitis B (vacuna contra la hepatitis B).  [place].  [place] agujas para inyectarse drogas.  Vive con alguien que tiene hepatitis B.  Ha tenido sexo con alguien que tiene hepatitis B.  Recibe tratamiento de hemodilisis.  Toma ciertos medicamentos para el cncer, trasplante de rganos y afecciones autoinmunitarias. Hepatitis C  Se recomienda un anlisis de [place] para:  Todos los que nacieron entre 1945 y [phone].  Todas las personas que tengan un riesgo de haber contrado hepatitis C. Enfermedades de transmisin sexual (ETS).  Debe realizarse pruebas de deteccin de enfermedades de transmisin sexual (ETS), incluidas gonorrea y clamidia si:  Es sexualmente activo y es menor de 24aos.  Es mayor de 24aos, y Investment banker, operational informa que corre riesgo de tener este tipo de infecciones.  La actividad sexual ha cambiado desde que le hicieron la ltima prueba de deteccin y tiene  un riesgo mayor de tener clamidia o [place]. Pregntele al mdico si usted tiene riesgo.  Si no tiene el VIH, pero corre riesgo de infectarse por el virus, se recomienda tomar diariamente un medicamento recetado para evitar la infeccin. Esto se conoce como profilaxis previa a la exposicin. Se considera que est en riesgo si:  Es [place] sexualmente y no [place] preservativos habitualmente o no conoce el estado del VIH de sus Advertising copywriter.  Se inyecta drogas.  Es [place] sexualmente con Ardelia Mems pareja que tiene VIH. Consulte a su mdico para saber si tiene un alto riesgo de infectarse por el VIH. Si opta por comenzar la profilaxis previa a la exposicin, primero debe realizarse anlisis de deteccin del VIH. Luego, le harn anlisis cada 53mses mientras est tomando los medicamentos para la profilaxis previa a la exposicin. EEm[facility] Si es premenopusica y puede quedar eScotchtown solicite a su mdico asesoramiento previo a la concepcin.  Si puede quedar embarazada, tome 400 a [id number](mcg) de cido fAnheuser-Busch  Si desea evitar el  embarazo, hable con su mdico sobre el control de la natalidad (anticoncepcin). OSTEOPOROSIS Y MENOPAUSIA  La osteoporosis es una enfermedad en la que los huesos pierden los minerales y la fuerza por el avance de la edad. El resultado pueden ser fracturas graves en los hHope El riesgo de osteoporosis puede identificarse con uArdelia Memsprueba de densidad sea.  Si tiene 65aos o ms, o si est en riesgo de sufrir osteoporosis y fracturas, pregunte a su mdico si debe someterse a exmenes.  Consulte a su mdico si debe tomar un suplemento de calcio o de vitamina D para reducir el riesgo de osteoporosis.  La menopausia puede presentar ciertos sntomas fsicos y rGaffer  La terapia de reemplazo hormonal puede reducir algunos de estos sntomas y rGaffer Consulte a su mdico para saber si la terapia de reemplazo hormonal es conveniente para usted. INSTRUCCIONES PARA EL CUIDADO EN EL HOGAR  Realcese los estudios de rutina de la salud, dentales y de lPublic librarian  MGordon[place]  No consuma ningn producto que contenga tabaco, lo que incluye cigarrillos, tabaco de mHigher education careers advisero cPsychologist, sport and exercise  Si est embarazada, no beba alcohol.  Si est amamantando, reduzca el consumo de alcohol y la frecuencia con la que consume.  Si es mujer y no est embarazada limite el consumo de alcohol a no ms de 1 medida por da. Una medida equivale a 12onzas de cerveza, 5onzas de vino o 1onzas de bebidas alcohlicas de alta graduacin.  No consuma drogas.  No comparta agujas.  Solicite ayuda a su mdico si necesita apoyo o informacin para abandonar las drogas.  Informe a su mdico si a menudo se siente deprimido.  Notifique a su mdico si alguna vez ha sido vctima de abuso o si no se siente seguro en su hogar. Esta informacin no tiene cMarine scientistel consejo del mdico. Asegrese de hacerle al mdico cualquier pregunta que tenga. Document Released: 07/08/2011 Document  Revised: 08/09/2014 Document Reviewed: 04/22/2015 Elsevier Interactive Patient Education  2017 [place](Major Depressive Disorder) La depresin mayor es un trastorno del estado de nimo. No es lo mismo que sentir tristeza cuando ocurre algo malo. Este trastorno pCoalmont Trabajar o hacer cosas con la familia y los amigos puede volverse difcil debido a este trastorno. La depresin mayor puede causar lo siguiente en las personas que la padecen:  THoliday representative  Amedeo Kinsman.  Impotencia.  Irritabilidad. Adems de estos sentimientos, las personas que padecen este trastorno tienen por lo menos 4de estos sntomas:  Problemas para dormir.  Dormir demasiado.  Un cambio importante en el apetito.  Un cambio importante en el peso.  Falta de energa.  Sentimientos de culpa o inutilidad.  Dificultad para concentrarse, recordar o tomar decisiones.  Movimientos lentos.  Agitacin.  Pensamientos o sueos de suicidio, o de daarse a s mismas.  Intento de suicidio. [place] los medicamentos de venta libre y los recetados solamente como se lo haya indicado el mdico.  Consulting civil engineer a todas las visitas de control como se lo haya indicado el mdico. Esto incluye cualquier terapia que le recomiende el mdico.  Reduzca el nivel de estrs. Haga cosas que disfruta, como andar en bicicleta, caminar o leer un libro.  Haga ejercicio fsico con frecuencia.  Consuma una dieta saludable.  No beba alcohol.  No consuma drogas.  Busque el 59 de sus familiares y Biomedical engineer. SOLICITE AYUDA DE INMEDIATO SI:  Empieza a escuchar voces.  Ve cosas que no existen.  Siente que las personas lo persiguen (paranoico).  Tiene pensamientos serios acerca de lastimarse a usted mismo o daar a Producer, television/film/video.  Piensa en el suicidio. Esta informacin no tiene Marine scientist el consejo del mdico. Asegrese de hacerle al mdico  cualquier pregunta que tenga. Document Released: 06/30/2015 Document Revised: 06/30/2015 Document Reviewed: 08/14/2012 Elsevier Interactive Patient Education  2017 [place] general sin causa (General Headache Without Cause) El dolor de cabeza es un dolor o Tree surgeon que se siente en la zona de la cabeza o del cuello. Hay muchas causas y tipos de dolores de [place]. En algunos casos, es posible que no se encuentre la causa. CUIDADOS EN EL HOGAR Control del [employer] Partners de venta libre y los recetados solamente como se lo haya indicado el mdico.  Cuando sienta dolor de cabeza acustese en un cuarto oscuro y tranquilo.  Si se lo indican, aplique hielo sobre la cabeza y la zona del cuello:  Ponga el hielo en una bolsa plstica.  Coloque una toalla entre la piel y la bolsa de hielo.  Coloque el hielo durante 28mnutos, 2 a 3veces por dTraining and development officer  Utilice una almohadilla trmica o tome una ducha con agua caliente para aplicar calor en la cabeza y la zona del cuello como se lo haya indicado el mDriftwoodluces tenues si le mChubb Corporationluces brillantes o sus dolores de cabeza empeoran. Comida y bebida  Mantenga un horario para las comidas.  Beba menos alcohol.  Consuma menos o deje de tomar cafena. Instrucciones generales  Concurra a todas las visitas de control como se lo haya indicado el mdico. Esto es importante.  Lleve un registro diario para aNeurosurgeonsi ciertas cosas provocan los dolores de cN[place] Por ejemplo, escriba los siguientes datos:  Lo que usted come y bBuyer, retail  Cunto tiempo duerme.  Algn cambio en su dieta o en los medicamentos.  Realice actividades relajantes, como recibir mBurdett  Disminuya el nivel de estrs.  Sintese con la espalda recta. No contraiga (tensione) los msculos.  No consuma productos que contengan tabaco. Estos incluyen cigarrillos, tabaco para mascar y cPsychologist, sport and exercise Si necesita  ayuda para dejar de fumar, consulte al mdico.  Haga ejercicios con regularidad tal como se lo indic el mdico.  Duerma lo suficiente. Esto a menudo significa entre 7  y [place]. SOLICITE AYUDA SI:  Los medicamentos no logran [employer].  Tiene un dolor de cabeza que es diferente a los otros dolores de [place].  Tiene malestar estomacal (nuseas) o vomita.  Tiene fiebre. SOLICITE AYUDA DE INMEDIATO SI:  El dolor de [place].  Sigue vomitando.  Presenta rigidez en el cuello.  Tiene dificultad para ver.  Tiene dificultad para hablar.  Siente dolor en el ojo o en el odo.  Sus msculos estn dbiles, o pierde el control muscular.  Pierde el equilibrio o tiene problemas para Writer.  Siente que se desvanece (pierde el conocimiento) o se desmaya.  Se siente confundido. Esta informacin no tiene Marine scientist el consejo del mdico. Asegrese de hacerle al mdico cualquier pregunta que tenga. Document Released: 10/11/2011 Document Revised: 04/09/2015 Document Reviewed: 11/11/2014 Elsevier Interactive Patient Education  2017 [employer].

## 2016-11-24 LAB — CMP AND LIVER
ALBUMIN: 4.2 g/dL (ref 3.5–5.5)
ALT: 17 IU/L (ref 0–32)
AST: 23 IU/L (ref 0–40)
Alkaline Phosphatase: 67 IU/L (ref 39–117)
BUN: 20 mg/dL (ref 6–24)
Bilirubin, Direct: 0.04 mg/dL (ref 0.00–0.40)
CALCIUM: 9.1 mg/dL (ref 8.7–10.2)
CO2: 20 mmol/L (ref 18–29)
CREATININE: 0.56 mg/dL — AB (ref 0.57–1.00)
Chloride: 103 mmol/L (ref 96–106)
GFR, EST AFRICAN AMERICAN: 135 mL/min/{1.73_m2} (ref >59)
GFR, EST NON AFRICAN AMERICAN: 117 mL/min/{1.73_m2} (ref >59)
GLUCOSE: 89 mg/dL (ref 65–99)
POTASSIUM: 4.3 mmol/L (ref 3.5–5.2)
Sodium: 138 mmol/L (ref 134–144)
Total Protein: 6.7 g/dL (ref 6.0–8.5)

## 2016-11-24 LAB — CBC WITH DIFFERENTIAL/PLATELET
BASOS: 1 %
Basophils Absolute: 0.1 10*3/uL (ref 0.0–0.2)
EOS (ABSOLUTE): 0.4 10*3/uL (ref 0.0–0.4)
EOS: 5 %
HEMATOCRIT: 36.6 % (ref 34.0–46.6)
Hemoglobin: 12 g/dL (ref 11.1–15.9)
IMMATURE GRANS (ABS): 0 10*3/uL (ref 0.0–0.1)
IMMATURE GRANULOCYTES: 0 %
LYMPHS: 30 %
Lymphocytes Absolute: 2.6 10*3/uL (ref 0.7–3.1)
MCH: 28.6 pg (ref 26.6–33.0)
MCHC: 32.8 g/dL (ref 31.5–35.7)
MCV: 87 fL (ref 79–97)
Monocytes Absolute: 0.7 10*3/uL (ref 0.1–0.9)
Monocytes: 8 %
NEUTROS ABS: 4.9 10*3/uL (ref 1.4–7.0)
Neutrophils: 56 %
Platelets: 306 10*3/uL (ref 150–379)
RBC: 4.19 x10E6/uL (ref 3.77–5.28)
RDW: 14.4 % (ref 12.3–15.4)
WBC: 8.7 10*3/uL (ref 3.4–10.8)

## 2016-11-24 LAB — TSH: TSH: 1.71 u[IU]/mL (ref 0.450–4.500)

## 2016-11-26 LAB — CYTOLOGY - PAP
BACTERIAL VAGINITIS: NEGATIVE
Candida vaginitis: POSITIVE — AB
Diagnosis: NEGATIVE
HPV (WINDOPATH): NOT DETECTED

## 2016-11-29 ENCOUNTER — Other Ambulatory Visit: Payer: Self-pay | Admitting: Internal Medicine

## 2016-11-29 MED ORDER — NYSTATIN 100000 UNIT/GM EX POWD: Freq: Four times a day (QID) | CUTANEOUS | 0 refills | Status: DC

## 2016-11-29 MED ORDER — FLUCONAZOLE 150 MG PO TABS: 150.0000 mg | ORAL_TABLET | Freq: Every day | ORAL | 0 refills | Status: DC

## 2016-11-29 MED ORDER — LEVOTHYROXINE SODIUM 25 MCG PO TABS: 25.0000 ug | ORAL_TABLET | Freq: Every day | ORAL | 2 refills | Status: DC

## 2016-12-01 ENCOUNTER — Telehealth: Payer: Self-pay

## 2016-12-01 MED FILL — FLUCONAZOLE 150 MG TABLET: 150 | 1 days supply | Qty: 1 | Fill #0

## 2016-12-01 MED FILL — LEVOTHYROXINE 25 MCG TABLET: 25 | 30 days supply | Qty: 30 | Fill #0

## 2016-12-01 MED FILL — NYSTOP 100,000 UNITS/GM PWD: 100000 | 7 days supply | Qty: 15 | Fill #0

## 2016-12-01 NOTE — Telephone Encounter (Signed)
[employer] Jesus [phone] contacted pt to go over lab results pt didn't answer and was unable to lvm  If pt calls back please give results: Liver/kidney fn nml, blood count nml, thyroid nml, reordered synthroid same dose. No diabetes or prediabtes. She had some yeast in her papsmear. rx diflucan x 1 and nystatatin powder rx ordered. Eat more probiotics/ie yogurt such as Activia - to avoid recurrent yeast infections. Papsmear o/w neg - repeat in 3 years.

## 2016-12-03 ENCOUNTER — Encounter (HOSPITAL_COMMUNITY): Payer: Self-pay

## 2016-12-03 DIAGNOSIS — E119 Type 2 diabetes mellitus without complications: Secondary | ICD-10-CM | POA: Insufficient documentation

## 2016-12-03 DIAGNOSIS — R197 Diarrhea, unspecified: Secondary | ICD-10-CM | POA: Insufficient documentation

## 2016-12-03 LAB — COMPREHENSIVE METABOLIC PANEL
ALBUMIN: 3.7 g/dL (ref 3.5–5.0)
ALT: 27 U/L (ref 14–54)
AST: 29 U/L (ref 15–41)
Alkaline Phosphatase: 68 U/L (ref 38–126)
Anion gap: 9 (ref 5–15)
BUN: 10 mg/dL (ref 6–20)
CHLORIDE: 103 mmol/L (ref 101–111)
CO2: 22 mmol/L (ref 22–32)
Calcium: 8.4 mg/dL — ABNORMAL LOW (ref 8.9–10.3)
Creatinine, Ser: 0.57 mg/dL (ref 0.44–1.00)
GFR calc Af Amer: >60 mL/min (ref >60)
GFR calc non Af Amer: >60 mL/min (ref >60)
GLUCOSE: 100 mg/dL — AB (ref 65–99)
POTASSIUM: 3.4 mmol/L — AB (ref 3.5–5.1)
SODIUM: 134 mmol/L — AB (ref 135–145)
TOTAL PROTEIN: 7 g/dL (ref 6.5–8.1)
Total Bilirubin: 0.5 mg/dL (ref 0.3–1.2)

## 2016-12-03 LAB — URINALYSIS, ROUTINE W REFLEX MICROSCOPIC
BILIRUBIN URINE: NEGATIVE
Glucose, UA: NEGATIVE mg/dL
Ketones, ur: NEGATIVE mg/dL
NITRITE: NEGATIVE
PROTEIN: NEGATIVE mg/dL
SPECIFIC GRAVITY, URINE: 1.023 (ref 1.005–1.030)
pH: 5 (ref 5.0–8.0)

## 2016-12-03 LAB — LIPASE, BLOOD: LIPASE: 28 U/L (ref 11–51)

## 2016-12-03 LAB — CBC
HEMATOCRIT: 38.5 % (ref 36.0–46.0)
HEMOGLOBIN: 12.6 g/dL (ref 12.0–15.0)
MCH: 28 pg (ref 26.0–34.0)
MCHC: 32.7 g/dL (ref 30.0–36.0)
MCV: 85.6 fL (ref 78.0–100.0)
Platelets: 292 10*3/uL (ref 150–400)
RBC: 4.5 MIL/uL (ref 3.87–5.11)
RDW: 14.1 % (ref 11.5–15.5)
WBC: 5.3 10*3/uL (ref 4.0–10.5)

## 2016-12-03 LAB — POC URINE PREG, ED: PREG TEST UR: NEGATIVE

## 2016-12-03 NOTE — ED Triage Notes (Signed)
Pt complaining of abdominal pain x 1 day. Pt denies any urinary symptoms. Pt states is nauseated, denies any emesis. Pt states 20 + episodes of diarrhea today. Pt complaining of fevers at home.

## 2016-12-03 NOTE — ED Notes (Signed)
No answer x2 

## 2016-12-04 ENCOUNTER — Emergency Department (HOSPITAL_COMMUNITY)
Admission: EM | Admit: 2016-12-04 | Discharge: 2016-12-04 | Disposition: A | Discharge status: Home / Self Care | Payer: Self-pay | Attending: Emergency Medicine | Admitting: Emergency Medicine

## 2016-12-04 DIAGNOSIS — R197 Diarrhea, unspecified: Secondary | ICD-10-CM

## 2016-12-04 LAB — GASTROINTESTINAL PANEL BY PCR, STOOL (REPLACES STOOL CULTURE)
ADENOVIRUS F40/41: NOT DETECTED
ASTROVIRUS: DETECTED — AB
CAMPYLOBACTER SPECIES: NOT DETECTED
CRYPTOSPORIDIUM: NOT DETECTED
Cyclospora cayetanensis: NOT DETECTED
ENTEROAGGREGATIVE E COLI (EAEC): NOT DETECTED
ENTEROPATHOGENIC E COLI (EPEC): NOT DETECTED
ENTEROTOXIGENIC E COLI (ETEC): NOT DETECTED
Entamoeba histolytica: NOT DETECTED
GIARDIA LAMBLIA: NOT DETECTED
Norovirus GI/GII: NOT DETECTED
PLESIMONAS SHIGELLOIDES: NOT DETECTED
ROTAVIRUS A: DETECTED — AB
Salmonella species: NOT DETECTED
Sapovirus (I, II, IV, and V): NOT DETECTED
Shiga like toxin producing E coli (STEC): NOT DETECTED
Shigella/Enteroinvasive E coli (EIEC): NOT DETECTED
VIBRIO SPECIES: NOT DETECTED
Vibrio cholerae: NOT DETECTED
YERSINIA ENTEROCOLITICA: NOT DETECTED

## 2016-12-04 LAB — I-STAT CG4 LACTIC ACID, ED: Lactic Acid, Venous: 1.34 mmol/L (ref 0.5–1.9)

## 2016-12-04 MED ORDER — METRONIDAZOLE 500 MG PO TABS: 500.0000 mg | ORAL_TABLET | Freq: Two times a day (BID) | ORAL | 0 refills | Status: DC

## 2016-12-04 MED ORDER — POTASSIUM CHLORIDE CRYS ER 20 MEQ PO TBCR
40.0000 meq | EXTENDED_RELEASE_TABLET | Freq: Once | ORAL | Status: AC
  Administered 2016-12-04: 40 meq via ORAL
  Filled 2016-12-04: qty 2

## 2016-12-04 MED ORDER — ONDANSETRON HCL 4 MG/2ML IJ SOLN
4.0000 mg | Freq: Once | INTRAMUSCULAR | Status: AC
  Administered 2016-12-04: 4 mg via INTRAVENOUS
  Filled 2016-12-04: qty 2

## 2016-12-04 MED ORDER — SODIUM CHLORIDE 0.9 % IV BOLUS (SEPSIS)
1000.0000 mL | Freq: Once | INTRAVENOUS | Status: AC
  Administered 2016-12-04: 1000 mL via INTRAVENOUS

## 2016-12-04 MED ORDER — LOPERAMIDE HCL 2 MG PO CAPS: 2.0000 mg | ORAL_CAPSULE | Freq: Four times a day (QID) | ORAL | 0 refills | Status: DC | PRN

## 2016-12-04 MED ORDER — CIPROFLOXACIN HCL 500 MG PO TABS: 500.0000 mg | ORAL_TABLET | Freq: Two times a day (BID) | ORAL | 0 refills | Status: DC

## 2016-12-04 NOTE — ED Notes (Signed)
Pt tolerating PO fluids at this time. Pt consumed 8oz Apple juice

## 2016-12-04 NOTE — ED Provider Notes (Signed)
[facility]-EM[facility]Y DEPT Provider Note   CSN: [id number] Arrival date & time: 12/03/16  2009     History   Chief Complaint Chief Complaint  Patient presents with  . Abdominal Pain    HPI Kristen Holt is a 41 y.o. female.  HPI   Patient is a 41 year old female with no PMH who presents with complaints of abdominal pain, nausea and vomiting, onset 2 days. Patient reports having intermittent cramping sensation to her mid abdomen which she notes is worse when she tries to eat or drink anything. Denies any alleviating factors. Endorses associated nausea and reports having appx. 30 episodes of nonbloody diarrhea daily. Patient states she has been taking Imodium at home without relief. Denies fever, chills, chest pain, shortness of breath, vomiting, hematochezia, urinary symptoms, blood in urine or stool, vaginal discharge. Patient reports starting her menstrual cycle 3 days ago. Denies any known sick contacts. Patient denies any recent antibiotic use, recent hospitalizations, recent travel outside of the [place] or drinking from fresh water sources. Endorses abdominal surgical history of appendectomy.  Past Medical History:  Diagnosis Date  . Acute appendicitis 01/13/2012  . Diabetes mellitus    pre diabetic   . Thyroid disease     Patient Active Problem List   Diagnosis Date Noted  . Pap smear for cervical cancer screening 08/21/2013  . Upper respiratory infection, acute 05/21/2013  . Streptococcal sore throat 02/01/2013  . Allergic rhinitis 02/01/2013  . Acute appendicitis 01/13/2012    Past Surgical History:  Procedure Laterality Date  . APPENDECTOMY    . LAPAROSCOPIC APPENDECTOMY  01/18/2012   Procedure: APPENDECTOMY LAPAROSCOPIC;  Surgeon: Currie Paris, MD;  Location: [facility] OR;  Service: General;  Laterality: N/A;    OB History    No data available       Home Medications    Prior to Admission medications   Medication Sig Start Date End Date Taking? Authorizing  Provider  escitalopram (LEXAPRO) 10 MG tablet Take 1 tablet (10 mg total) by mouth daily. 11/23/16  Yes Langeland, Dawn T, MD  levothyroxine (SYNTHROID, LEVOTHROID) 25 MCG tablet Take 1 tablet (25 mcg total) by mouth daily. 11/29/16  Yes Langeland, Dawn T, MD  ciprofloxacin (CIPRO) 500 MG tablet Take 1 tablet (500 mg total) by mouth 2 (two) times daily. 12/04/16   Barrett Henle, PA-C  fluconazole (DIFLUCAN) 150 MG tablet Take 1 tablet (150 mg total) by mouth daily. Patient not taking: Reported on 12/04/2016 11/29/16   Pete Glatter, MD  fluticasone [facility]) 50 MCG/ACT nasal spray Place 2 sprays into both nostrils daily. Patient not taking: Reported on 04/20/2016 09/06/13   Acey Lav, MD  loperamide (IMODIUM) 2 MG capsule Take 1 capsule (2 mg total) by mouth 4 (four) times daily as needed for diarrhea or loose stools. 12/04/16   Barrett Henle, PA-C  loratadine (CLARITIN) 10 MG tablet Take 1 tablet (10 mg total) by mouth daily. Patient not taking: Reported on 12/04/2016 09/06/13   Acey Lav, MD  metroNIDAZOLE (FLAGYL) 500 MG tablet Take 1 tablet (500 mg total) by mouth 2 (two) times daily. 12/04/16   Barrett Henle, PA-C  nystatin (MYCOSTATIN/NYSTOP) powder Apply topically 4 (four) times daily. Patient not taking: Reported on 12/04/2016 11/29/16   Pete Glatter, MD  Vitamin D, Ergocalciferol, (DRISDOL) 50000 units CAPS capsule Take 1 capsule (50,000 Units total) by mouth every 7 (seven) days. Patient not taking: Reported on 12/04/2016 04/21/16   Pete Glatter, MD  Family History Family History  Problem Relation Age of Onset  . Diabetes Mother     Social History Social History  Substance Use Topics  . Smoking status: Never Smoker  . Smokeless tobacco: Never Used  . Alcohol use No     Allergies   Patient has no known allergies.   Review of Systems Review of Systems  Gastrointestinal: Positive for abdominal pain, diarrhea and nausea.  All other  systems reviewed and are negative.    Physical Exam Updated Vital Signs BP (!) 103/55   Pulse 74   Temp 98.9 F (37.2 C) (Oral)   Resp 20   LMP 12/01/2016 (Exact Date)   SpO2 100%   Physical Exam  Constitutional: She is oriented to person, place, and time. She appears well-developed and well-nourished. No distress.  HENT:  Head: Normocephalic and atraumatic.  Mouth/Throat: Oropharynx is clear and moist. No oropharyngeal exudate.  Eyes: Conjunctivae and EOM are normal. Right eye exhibits no discharge. Left eye exhibits no discharge. No scleral icterus.  Neck: Normal range of motion. Neck supple.  Cardiovascular: Normal rate, regular rhythm, normal heart sounds and intact distal pulses.   Pulmonary/Chest: Effort normal and breath sounds normal. No respiratory distress. She has no wheezes. She has no rales. She exhibits no tenderness.  Abdominal: Soft. Bowel sounds are normal. She exhibits no distension and no mass. There is tenderness. There is no rigidity, no rebound, no guarding and no CVA tenderness. No hernia.  Mild TTP over epigastric and periumbilical region.  Musculoskeletal: Normal range of motion. She exhibits no edema.  Neurological: She is alert and oriented to person, place, and time.  Skin: Skin is warm and dry. She is not diaphoretic.  Nursing note and vitals reviewed.    ED Treatments / Results  Labs (all labs ordered are listed, but only abnormal results are displayed) Labs Reviewed  COMPREHENSIVE METABOLIC PANEL - Abnormal; Notable for the following:       Result Value   Sodium 134 (*)    Potassium 3.4 (*)    Glucose, Bld 100 (*)    Calcium 8.4 (*)    All other components within normal limits  URINALYSIS, ROUTINE W REFLEX MICROSCOPIC - Abnormal; Notable for the following:    APPearance HAZY (*)    Hgb urine dipstick MODERATE (*)    Leukocytes, UA SMALL (*)    Bacteria, UA RARE (*)    Squamous Epithelial / LPF 0-5 (*)    All other components within  normal limits  GASTROINTESTINAL PANEL BY PCR, STOOL (REPLACES STOOL CULTURE)  LIPASE, BLOOD  CBC  POC URINE PREG, ED  I-STAT CG4 LACTIC ACID, ED    EKG  EKG Interpretation None       Radiology No results found.  Procedures Procedures (including critical care time)  Medications Ordered in ED Medications  sodium chloride 0.9 % bolus 1,000 mL (0 mLs Intravenous Stopped 12/04/16 0407)  ondansetron (ZOFRAN) injection 4 mg (4 mg Intravenous Given 12/04/16 0206)  potassium chloride SA (K-DUR,KLOR-CON) CR tablet 40 mEq (40 mEq Oral Given 12/04/16 0321)  sodium chloride 0.9 % bolus 1,000 mL (0 mLs Intravenous Stopped 12/04/16 0509)     Initial Impression / Assessment and Plan / ED Course  I have reviewed the triage vital signs and the nursing notes.  Pertinent labs & imaging results that were available during my care of the patient were reviewed by me and considered in my medical decision making (see chart for details).  Patient presents with abdominal cramping with associated nausea and multiple episodes of nonbloody diarrhea for the past 2 days. Denies fever. Denies any recent antibiotic use. VSS. Exam revealed mild tenderness over periumbilical region and epigastric region, no peritoneal signs. Remaining exam unremarkable. Patient given IV fluids and antiemetics in the ED. Labs revealed negative pregnancy. Potassium 3.4, patient given oral potassium supplement in the ED. UA positive for moderate hemoglobin, patient reports being on her menstrual cycle, no signs of infection. Remaining labs unremarkable. No leukocytosis. Stool sample obtained in the ED and sent for PCR for further evaluation. On reevaluation pt reports feeling better with improved nausea. Tolerating PO. Plan to d/c pt home with cipro and flagyl with close PCP follow up. No indication of bowel obstruction, bowel perforation, cholecystitis, diverticulitis, PID or ectopic pregnancy warranting abdominal imaging for further  evaluation at this time.  Discussed return precautions.    Final Clinical Impressions(s) / ED Diagnoses   Final diagnoses:  Diarrhea, unspecified type    New Prescriptions Discharge Medication List as of 12/04/2016  5:46 AM    START taking these medications   Details  ciprofloxacin (CIPRO) 500 MG tablet Take 1 tablet (500 mg total) by mouth 2 (two) times daily., Starting Sat 12/04/2016, Print    loperamide (IMODIUM) 2 MG capsule Take 1 capsule (2 mg total) by mouth 4 (four) times daily as needed for diarrhea or loose stools., Starting Sat 12/04/2016, Print         Barrett Henleadeau, Nicole Elizabeth, [place]PA-C 12/04/16 [id number]    Glynn Octaveancour, Stephen, MD 12/04/16 [phone]

## 2016-12-04 NOTE — ED Provider Notes (Signed)
By signing my name below, I, Levon HedgerElizabeth Hall, attest that this documentation has been prepared under the direction and in the presence of Liboria Putnam, Jeannett SeniorStephen, MD . Electronically Signed: Levon HedgerElizabeth Hall, Scribe. 12/04/2016. 3:37 AM.    Welton FlakesGuadalupe Vargas-Morales is a 41 y.o. female who presents to the Emergency Department complaining of frequent clear diarrhea x 1 day. Pt reports associated central abdominal pain, dizziness, and headache. No recent sick contact. No abx use. She denies any melena, dysuria, hematuria.   Exam findings: soft abdomen, mild epigastric and periumbilical pain. No guarding or rebound.   No peritoneal signs on exam. Will treat symptoms.  I personally performed the services described in this documentation, which was scribed in my presence. The recorded information has been reviewed and is accurate.     Glynn Octaveancour, Chery Giusto, MD 12/04/16 [phone]

## 2016-12-04 NOTE — Discharge Instructions (Signed)
Take your antibiotics as prescribed until completed. Continue drinking fluids at home to remain hydrated.  I recommend following up with your primary care provider on Monday for follow-up evaluation and follow-up regarding the your pending stool results (PCR).  Please return to the Emergency Department if symptoms worsen or new onset of fever, chest pain, difficult to breathing, abdominal pain, vomiting, unable to keep fluids down, blood in stool.

## 2016-12-16 ENCOUNTER — Encounter: Payer: Self-pay | Admitting: Internal Medicine

## 2016-12-17 ENCOUNTER — Encounter: Payer: Self-pay | Admitting: Internal Medicine

## 2016-12-20 ENCOUNTER — Ambulatory Visit: Payer: Self-pay | Attending: Family Medicine | Admitting: Internal Medicine

## 2016-12-20 ENCOUNTER — Ambulatory Visit: Payer: Self-pay

## 2016-12-20 ENCOUNTER — Encounter: Payer: Self-pay | Admitting: Internal Medicine

## 2016-12-20 NOTE — Progress Notes (Signed)
Opened in error

## 2017-01-01 DIAGNOSIS — F32A Depression, unspecified: Secondary | ICD-10-CM | POA: Insufficient documentation

## 2017-01-01 DIAGNOSIS — E039 Hypothyroidism, unspecified: Secondary | ICD-10-CM | POA: Insufficient documentation

## 2017-01-01 DIAGNOSIS — F329 Major depressive disorder, single episode, unspecified: Secondary | ICD-10-CM | POA: Insufficient documentation

## 2017-01-03 ENCOUNTER — Encounter: Payer: Self-pay | Admitting: Internal Medicine

## 2017-01-03 ENCOUNTER — Ambulatory Visit: Payer: Self-pay | Attending: Internal Medicine | Admitting: Internal Medicine

## 2017-01-03 VITALS — BP 103/67 | HR 68 | Temp 98.3°F | Resp 16 | Wt 140.6 lb

## 2017-01-03 DIAGNOSIS — F329 Major depressive disorder, single episode, unspecified: Secondary | ICD-10-CM

## 2017-01-03 DIAGNOSIS — Z79899 Other long term (current) drug therapy: Secondary | ICD-10-CM | POA: Insufficient documentation

## 2017-01-03 DIAGNOSIS — K529 Noninfective gastroenteritis and colitis, unspecified: Secondary | ICD-10-CM

## 2017-01-03 DIAGNOSIS — F32A Depression, unspecified: Secondary | ICD-10-CM

## 2017-01-03 DIAGNOSIS — E039 Hypothyroidism, unspecified: Secondary | ICD-10-CM

## 2017-01-03 NOTE — Progress Notes (Signed)
Patient ID: Kristen Holt, female    DOB: [birth year]  MRN: [id number]  CC: Establish Care   Subjective: Kristen Holt is a 41 y.o. female who presents for ER f/u. Her concerns today include: Pt with hx of hypothyroid, depression.   Seen in ER 12/04/2016 with vomiting/diarrhea/abdominal pain x 2 days.  Treated for gastroenteritis with IVF, antiemitic. Dischg with Cipro and Flagyl which she completed.  K+ level low and stool + for Rotavirus. Still feels a little weak.  GI symptoms resolved.  2.  Depression:  Taking Lexapro as prescribed on last visit.  Feeling better.  3.  Hypothyroid:  Last TSH 10/2016 was ok.  Patient Active Problem List   Diagnosis Date Noted  . Depression 01/01/2017  . Hypothyroid 01/01/2017  . Pap smear for cervical cancer screening 08/21/2013  . Allergic rhinitis 02/01/2013  . Acute appendicitis 01/13/2012     Current Outpatient Prescriptions on File Prior to Visit  Medication Sig Dispense Refill  . levothyroxine (SYNTHROID, LEVOTHROID) 25 MCG tablet Take 1 tablet (25 mcg total) by mouth daily. 90 tablet 2  . escitalopram (LEXAPRO) 10 MG tablet Take 1 tablet (10 mg total) by mouth daily. 30 tablet 1  . fluticasone (FLONASE) 50 MCG/ACT nasal spray Place 2 sprays into both nostrils daily. (Patient not taking: Reported on 04/20/2016) 16 g 2  . loratadine (CLARITIN) 10 MG tablet Take 1 tablet (10 mg total) by mouth daily. (Patient not taking: Reported on 12/04/2016) 30 tablet 2  . Vitamin D, Ergocalciferol, (DRISDOL) 50000 units CAPS capsule Take 1 capsule (50,000 Units total) by mouth every 7 (seven) days. (Patient not taking: Reported on 12/04/2016) 12 capsule 0  . [DISCONTINUED] Ranitidine HCl (ZANTAC PO) Take by mouth.     No current facility-administered medications on file prior to visit.     No Known Allergies  Social History   Social History  . Marital status: Single    Spouse name: N/A  . Number of children: N/A  . Years of  education: N/A   Occupational History  . Not on file.   Social History Main Topics  . Smoking status: Never Smoker  . Smokeless tobacco: Never Used  . Alcohol use No  . Drug use: No  . Sexual activity: Yes    Partners: Male   Other Topics Concern  . Not on file   Social History Narrative   ** Merged History Encounter **        Family History  Problem Relation Age of Onset  . Diabetes Mother     Past Surgical History:  Procedure Laterality Date  . APPENDECTOMY    . LAPAROSCOPIC APPENDECTOMY  01/18/2012   Procedure: APPENDECTOMY LAPAROSCOPIC;  Surgeon: Currie Paris, MD;  Location: [facility] OR;  Service: General;  Laterality: N/A;    ROS: Review of Systems -as stated above PHYSICAL EXAM: BP 103/67   Pulse 68   Temp 98.3 F (36.8 C) (Oral)   Resp 16   Wt 140 lb 9.6 oz (63.8 kg)   SpO2 98%   BMI 25.72 kg/m   Physical Exam  General appearance - alert, well appearing, and in no distress Mental status - alert, oriented to person, place, and time, normal mood, behavior, speech, dress, motor activity, and thought processes Neck - supple, no significant adenopathy Chest - clear to auscultation, no wheezes, rales or rhonchi, symmetric air entry Heart - normal rate, regular rhythm, normal S1, S2, no murmurs, rubs, clicks or gallops Extremities - peripheral pulses  normal, no pedal edema, no clubbing or cyanosis  Depression screen [facility] 2/9 04/20/2016  Decreased Interest 0  Down, Depressed, Hopeless 0  PHQ - 2 Score 0    GAD 7 : Generalized Anxiety Score 04/20/2016  Nervous, Anxious, on Edge 0  Control/stop worrying 0  Worry too much - different things 0  Trouble relaxing 0  Restless 0  Easily annoyed or irritable 0  Afraid - awful might happen 0  Total GAD 7 Score 0     ASSESSMENT AND PLAN: 1. Hypothyroidism, unspecified type -continue current Levothyroxine dose  2. Depression, unspecified depression type -better on Lexapro.  Continue for now  3.  Gastroenteritis -resolved  Patient was given the opportunity to ask questions.  Patient verbalized understanding of the plan and was able to repeat key elements of the plan.  [employer] interpreter used during this encounter.  No orders of the defined types were placed in this encounter.    Requested Prescriptions    No prescriptions requested or ordered in this encounter    Return in about 5 months (around 06/05/2017).  [name], MD, FACP

## 2017-01-03 NOTE — Patient Instructions (Signed)
Continue Lexapro and Levothyroxine.

## 2017-01-12 ENCOUNTER — Telehealth: Payer: Self-pay | Admitting: Internal Medicine

## 2017-01-12 MED ORDER — LEVOTHYROXINE SODIUM 25 MCG PO TABS: 25.0000 ug | ORAL_TABLET | Freq: Every day | ORAL | 0 refills | Status: DC

## 2017-01-12 MED FILL — LEVOTHYROXINE 25 MCG TABLET: 25 | 30 days supply | Qty: 30 | Fill #0

## 2017-01-12 NOTE — Telephone Encounter (Signed)
Refilled levothyroxine.

## 2017-01-12 NOTE — Telephone Encounter (Signed)
Pt. Came to facility requesting a refill on levothyroxine (SYNTHROID, LEVOTHROID) Pt. Uses [facility] pharmacy. Please f/u

## 2017-02-25 ENCOUNTER — Telehealth: Payer: Self-pay | Admitting: Internal Medicine

## 2017-02-25 MED ORDER — LEVOTHYROXINE SODIUM 25 MCG PO TABS: 25.0000 ug | ORAL_TABLET | Freq: Every day | ORAL | 0 refills | Status: DC

## 2017-02-25 MED FILL — ?LEVOTHYROXINE 25 MCG TABLE: 25 | 30 days supply | Qty: 30 | Fill #0

## 2017-02-25 NOTE — Telephone Encounter (Signed)
[facility]

## 2017-02-25 NOTE — Telephone Encounter (Signed)
Pt. Called requesting a refill on levothyroxine (SYNTHROID, LEVOTHROID) 25 MCG tablet Pt. Uses [facility] pharmacy. Please f/u with pt.

## 2017-03-01 ENCOUNTER — Ambulatory Visit: Payer: Self-pay

## 2017-03-03 ENCOUNTER — Encounter: Payer: Self-pay | Admitting: Physician Assistant

## 2017-03-03 ENCOUNTER — Ambulatory Visit: Payer: Self-pay | Attending: Internal Medicine | Admitting: Physician Assistant

## 2017-03-03 VITALS — BP 99/56 | HR 73 | Temp 98.3°F | Resp 18 | Ht 62.0 in | Wt 147.0 lb

## 2017-03-03 DIAGNOSIS — H538 Other visual disturbances: Secondary | ICD-10-CM | POA: Insufficient documentation

## 2017-03-03 DIAGNOSIS — J302 Other seasonal allergic rhinitis: Secondary | ICD-10-CM | POA: Insufficient documentation

## 2017-03-03 DIAGNOSIS — E079 Disorder of thyroid, unspecified: Secondary | ICD-10-CM | POA: Insufficient documentation

## 2017-03-03 DIAGNOSIS — R739 Hyperglycemia, unspecified: Secondary | ICD-10-CM

## 2017-03-03 DIAGNOSIS — Z79899 Other long term (current) drug therapy: Secondary | ICD-10-CM | POA: Insufficient documentation

## 2017-03-03 DIAGNOSIS — E1165 Type 2 diabetes mellitus with hyperglycemia: Secondary | ICD-10-CM | POA: Insufficient documentation

## 2017-03-03 LAB — GLUCOSE, POCT (MANUAL RESULT ENTRY): POC GLUCOSE: 124 mg/dL — AB (ref 70–99)

## 2017-03-03 LAB — POCT GLYCOSYLATED HEMOGLOBIN (HGB A1C): HEMOGLOBIN A1C: 5.1

## 2017-03-03 MED ORDER — AZELASTINE HCL 0.05 % OP SOLN: 1.0000 [drp] | Freq: Two times a day (BID) | OPHTHALMIC | 12 refills | Status: DC

## 2017-03-03 MED ORDER — CETIRIZINE HCL 10 MG PO TABS: 10.0000 mg | ORAL_TABLET | Freq: Every day | ORAL | 11 refills | Status: DC

## 2017-03-03 MED FILL — ?CETIRIZINE HCL 10 MG TABLE: 10 | 30 days supply | Qty: 30 | Fill #0

## 2017-03-03 MED FILL — AZELASTINE HCL 0.05% DROPS: 0.05 | 60 days supply | Qty: 6 | Fill #0

## 2017-03-03 NOTE — Progress Notes (Signed)
Patient ID: Kristen Holt, female   DOB: 1976/05/27, 41 y.o.   MRN: [id number]     Kristen Holt, is a 41 y.o. female  [id number]  [id number]  DOB - 1976/05/27  Subjective:  Chief Complaint and HPI: Kristen Holt is a 41 y.o. female here today for For c/o vision problems.  Compliant with thyroid medications.  No new med changes.  She says her vision has been cloudy for the last 2 weeks.  +some itching and burning.  +some sneezing.  She got some OTC Xyzal which helped some.  Vision seems a little blurry when trying to read.  No pain.  No loss of vision.  +some stringy/mucoid discharge  [employer] interpreters used-[place]  ROS:   Constitutional:  No f/c, No night sweats, No unexplained weight loss. EENT:  + vision changes, + blurry vision, No hearing changes. No mouth, throat, or ear problems. +sneezing Respiratory: No cough, No SOB Cardiac: No CP, no palpitations GI:  No abd pain, No N/V/D. GU: No Urinary s/sx Musculoskeletal: No joint pain Neuro: No headache, no dizziness, no motor weakness.  Skin: No rash Endocrine:  No polydipsia. No polyuria.  Psych: Denies SI/HI  No problems updated.  ALLERGIES: No Known Allergies  PAST MEDICAL HISTORY: Past Medical History:  Diagnosis Date  . Acute appendicitis 01/13/2012  . Diabetes mellitus    pre diabetic   . Thyroid disease     MEDICATIONS AT HOME: Prior to Admission medications   Medication Sig Start Date End Date Taking? Authorizing Provider  azelastine (OPTIVAR) 0.05 % ophthalmic solution Place 1 drop into both eyes 2 (two) times daily. 03/03/17   Anders SimmondsMcClung, Kriti Katayama M, PA-C  cetirizine (ZYRTEC) 10 MG tablet Take 1 tablet (10 mg total) by mouth daily. 03/03/17   Anders SimmondsMcClung, Erial Fikes M, PA-C  escitalopram (LEXAPRO) 10 MG tablet Take 1 tablet (10 mg total) by mouth daily. 11/23/16   Langeland, Kathaleen Grinderawn T, MD  fluticasone (FLONASE) 50 MCG/ACT nasal spray Place 2 sprays into both nostrils daily. Patient  not taking: Reported on 04/20/2016 09/06/13   Acey LavWood, Allison L, MD  levothyroxine (SYNTHROID, LEVOTHROID) 25 MCG tablet Take 1 tablet (25 mcg total) by mouth daily. 02/25/17   Marcine MatarJohnson, Deborah B, MD  Vitamin D, Ergocalciferol, (DRISDOL) 50000 units CAPS capsule Take 1 capsule (50,000 Units total) by mouth every 7 (seven) days. Patient not taking: Reported on 12/04/2016 04/21/16   Pete GlatterLangeland, Dawn T, MD     Objective:  EXAM:   Vitals:   03/03/17 1518  BP: (!) 99/56  Pulse: 73  Resp: 18  Temp: 98.3 F (36.8 C)  TempSrc: Oral  SpO2: 99%  Weight: 147 lb (66.7 kg)  Height: 5\' 2"  (1.575 m)    General appearance : A&OX3. NAD. Non-toxic-appearing HEENT: Atraumatic and Normocephalic.  PERRLA. EOM intact.  B conjunctivae with pale erythema and mild chemosis. Fundi benign B. TM clear B. Mouth-MMM, post pharynx WNL w/o erythema, No PND. Neck: supple, no JVD. No cervical lymphadenopathy. No thyromegaly Chest/Lungs:  Breathing-non-labored, Good air entry bilaterally, breath sounds normal without rales, rhonchi, or wheezing  CVS: S1 S2 regular, no murmurs, gallops, rubs  Extremities: Bilateral Lower Ext shows no edema, both legs are warm to touch with = pulse throughout Neurology:  CN II-XII grossly intact, Non focal.   Psych:  TP linear. J/I WNL. Normal speech. Appropriate eye contact and affect.  Skin:  No Rash  Data Review Lab Results  Component Value Date   HGBA1C 5.1 03/03/2017   HGBA1C 5.5 11/23/2016  Assessment & Plan   1. Blurry vision Likely secondary to allergies in addition to age-related changes.  No acute issues - Glucose (CBG) - Ambulatory referral to Ophthalmology - azelastine (OPTIVAR) 0.05 % ophthalmic solution; Place 1 drop into both eyes 2 (two) times daily.  Dispense: 6 mL; Refill: 12  2. Hyperglycemia Normal A1C - HgB A1c I have had a lengthy discussion and provided education about insulin resistance and the intake of too much sugar/refined carbohydrates.  I have  advised the patient to work at a goal of eliminating sugary drinks, candy, desserts, sweets, refined sugars, processed foods, and white carbohydrates.  The patient expresses understanding.   3. Seasonal allergic rhinitis, unspecified trigger Stop Xyzal - cetirizine (ZYRTEC) 10 MG tablet; Take 1 tablet (10 mg total) by mouth daily.  Dispense: 30 tablet; Refill: 11 - azelastine (OPTIVAR) 0.05 % ophthalmic solution; Place 1 drop into both eyes 2 (two) times daily.  Dispense: 6 mL; Refill: 12     Patient have been counseled extensively about nutrition and exercise  Return if symptoms worsen or fail to improve.  The patient was given clear instructions to go to ER or return to medical center if symptoms don't improve, worsen or new problems develop. The patient verbalized understanding. The patient was told to call to get lab results if they haven't heard anything in the next week.     Georgian CoAngela Jasmynn Pfalzgraf, PA-C [facility] [place], [place] [phone]   03/03/2017, 3:22 PM

## 2017-03-31 ENCOUNTER — Ambulatory Visit: Payer: Self-pay

## 2017-04-02 ENCOUNTER — Encounter (HOSPITAL_COMMUNITY): Payer: Self-pay | Admitting: Family Medicine

## 2017-04-02 ENCOUNTER — Ambulatory Visit (HOSPITAL_COMMUNITY)
Admission: EM | Admit: 2017-04-02 | Discharge: 2017-04-02 | Disposition: A | Discharge status: Home / Self Care | Payer: Self-pay | Attending: Radiology | Admitting: Radiology

## 2017-04-02 DIAGNOSIS — Z79899 Other long term (current) drug therapy: Secondary | ICD-10-CM | POA: Insufficient documentation

## 2017-04-02 DIAGNOSIS — N760 Acute vaginitis: Secondary | ICD-10-CM | POA: Insufficient documentation

## 2017-04-02 DIAGNOSIS — F329 Major depressive disorder, single episode, unspecified: Secondary | ICD-10-CM | POA: Insufficient documentation

## 2017-04-02 DIAGNOSIS — B9689 Other specified bacterial agents as the cause of diseases classified elsewhere: Secondary | ICD-10-CM

## 2017-04-02 DIAGNOSIS — R102 Pelvic and perineal pain: Secondary | ICD-10-CM | POA: Insufficient documentation

## 2017-04-02 DIAGNOSIS — E119 Type 2 diabetes mellitus without complications: Secondary | ICD-10-CM | POA: Insufficient documentation

## 2017-04-02 DIAGNOSIS — E039 Hypothyroidism, unspecified: Secondary | ICD-10-CM | POA: Insufficient documentation

## 2017-04-02 MED ORDER — METRONIDAZOLE 500 MG PO TABS
500.0000 mg | ORAL_TABLET | Freq: Two times a day (BID) | ORAL | 0 refills | Status: AC
Start: 2017-04-02 — End: 2017-04-10

## 2017-04-02 NOTE — ED Triage Notes (Signed)
Pt here for 1 week of vaginal itching and burning. Used OTC cream with little relief.

## 2017-04-02 NOTE — ED Provider Notes (Signed)
[facility]    CSN: [id number] Arrival date & time: 04/02/17  1204     History   Chief Complaint Chief Complaint  Patient presents with  . Vaginitis    HPI Kristen Holt is a 41 y.o. female.   41 y.o. female presents with vaginal pain and burning  X. 8 days Condition is acute in nature. Condition is made better by nothing. Condition is made worse by nohging. Patient states that she bought an unknown cream at a pharmacy and it worked for 1 day but then her symptoms reappered. Patient denies any fevers, vaginal discharge, nausea, vomiting or pain with urination. Patient states that she is sexually active with 1 partner and they use condoms       Past Medical History:  Diagnosis Date  . Acute appendicitis 01/13/2012  . Diabetes mellitus    pre diabetic   . Thyroid disease     Patient Active Problem List   Diagnosis Date Noted  . Depression 01/01/2017  . Hypothyroid 01/01/2017  . Pap smear for cervical cancer screening 08/21/2013  . Allergic rhinitis 02/01/2013  . Acute appendicitis 01/13/2012    Past Surgical History:  Procedure Laterality Date  . APPENDECTOMY    . LAPAROSCOPIC APPENDECTOMY  01/18/2012   Procedure: APPENDECTOMY LAPAROSCOPIC;  Surgeon: Currie Paris, MD;  Location: [facility] OR;  Service: General;  Laterality: N/A;    OB History    No data available       Home Medications    Prior to Admission medications   Medication Sig Start Date End Date Taking? Authorizing Provider  azelastine (OPTIVAR) 0.05 % ophthalmic solution Place 1 drop into both eyes 2 (two) times daily. 03/03/17   Anders Simmonds, PA-C  cetirizine (ZYRTEC) 10 MG tablet Take 1 tablet (10 mg total) by mouth daily. 03/03/17   Anders Simmonds, PA-C  escitalopram (LEXAPRO) 10 MG tablet Take 1 tablet (10 mg total) by mouth daily. 11/23/16   Langeland, Kathaleen Grinder, MD  fluticasone (FLONASE) 50 MCG/ACT nasal spray Place 2 sprays into both nostrils daily. Patient not taking:  Reported on 04/20/2016 09/06/13   Acey Lav, MD  levothyroxine (SYNTHROID, LEVOTHROID) 25 MCG tablet Take 1 tablet (25 mcg total) by mouth daily. 02/25/17   Marcine Matar, MD  Vitamin D, Ergocalciferol, (DRISDOL) 50000 units CAPS capsule Take 1 capsule (50,000 Units total) by mouth every 7 (seven) days. Patient not taking: Reported on 12/04/2016 04/21/16   Pete Glatter, MD    Family History Family History  Problem Relation Age of Onset  . Diabetes Mother     Social History Social History  Substance Use Topics  . Smoking status: Never Smoker  . Smokeless tobacco: Never Used  . Alcohol use No     Allergies   Patient has no known allergies.   Review of Systems Review of Systems  Constitutional: Negative for chills and fever.  HENT: Negative for ear pain and sore throat.   Eyes: Negative for pain and visual disturbance.  Respiratory: Negative for cough and shortness of breath.   Cardiovascular: Negative for chest pain and palpitations.  Gastrointestinal: Negative for abdominal pain and vomiting.  Genitourinary: Positive for pelvic pain. Negative for dysuria and hematuria.       Burning  Musculoskeletal: Negative for arthralgias and back pain.  Skin: Negative for color change and rash.  Neurological: Negative for seizures and syncope.  All other systems reviewed and are negative.    Physical Exam Triage Vital  Signs ED Triage Vitals [04/02/17 1222]  Enc Vitals Group     BP 103/68     Pulse Rate 71     Resp 18     Temp 98.5 F (36.9 C)     Temp src      SpO2 99 %     Weight      Height      Head Circumference      Peak Flow      Pain Score      Pain Loc      Pain Edu?      Excl. in GC?    No data found.   Updated Vital Signs BP 103/68   Pulse 71   Temp 98.5 F (36.9 C)   Resp 18   LMP 03/31/2017   SpO2 99%   Visual Acuity Right Eye Distance:   Left Eye Distance:   Bilateral Distance:    Right Eye Near:   Left Eye Near:    Bilateral  Near:     Physical Exam  Constitutional: She is oriented to person, place, and time. She appears well-developed and well-nourished.  HENT:  Head: Normocephalic and atraumatic.  Eyes: Conjunctivae are normal.  Neck: Normal range of motion.  Pulmonary/Chest: Effort normal.  Genitourinary:  Genitourinary Comments: White thin discharge noted during vaginal exam.  Musculoskeletal: Normal range of motion.  Neurological: She is alert and oriented to person, place, and time.  Skin: Skin is warm.  Psychiatric: She has a normal mood and affect.  Nursing note and vitals reviewed.    UC Treatments / Results  Labs (all labs ordered are listed, but only abnormal results are displayed) Labs Reviewed - No data to display  EKG  EKG Interpretation None       Radiology No results found.  Procedures Procedures (including critical care time)  Medications Ordered in UC Medications - No data to display   Initial Impression / Assessment and Plan / UC Course  I have reviewed the triage vital signs and the nursing notes.  Pertinent labs & imaging results that were available during my care of the patient were reviewed by me and considered in my medical decision making (see chart for details).      Final Clinical Impressions(s) / UC Diagnoses   Final diagnoses:  None    New Prescriptions New Prescriptions   No medications on file     Controlled Substance Prescriptions [place] Controlled Substance Registry consulted? Not Applicable   Alene MiresOmohundro, Jensyn Cambria C, NP 04/02/17 1258

## 2017-04-06 ENCOUNTER — Ambulatory Visit: Payer: Self-pay | Attending: Internal Medicine | Admitting: Physician Assistant

## 2017-04-06 VITALS — BP 91/61 | HR 65 | Temp 98.3°F | Wt 148.8 lb

## 2017-04-06 DIAGNOSIS — Z3201 Encounter for pregnancy test, result positive: Secondary | ICD-10-CM | POA: Insufficient documentation

## 2017-04-06 DIAGNOSIS — L298 Other pruritus: Secondary | ICD-10-CM

## 2017-04-06 DIAGNOSIS — N926 Irregular menstruation, unspecified: Secondary | ICD-10-CM

## 2017-04-06 DIAGNOSIS — R102 Pelvic and perineal pain: Secondary | ICD-10-CM | POA: Insufficient documentation

## 2017-04-06 DIAGNOSIS — N898 Other specified noninflammatory disorders of vagina: Secondary | ICD-10-CM

## 2017-04-06 DIAGNOSIS — Z79899 Other long term (current) drug therapy: Secondary | ICD-10-CM | POA: Insufficient documentation

## 2017-04-06 LAB — POCT URINALYSIS DIPSTICK
Blood, UA: NEGATIVE
Glucose, UA: NEGATIVE
Ketones, UA: NEGATIVE
Nitrite, UA: NEGATIVE
PROTEIN UA: 30
Spec Grav, UA: 1.02 (ref 1.010–1.025)
UROBILINOGEN UA: 0.2 U/dL
pH, UA: 6.5 (ref 5.0–8.0)

## 2017-04-06 LAB — POCT URINE PREGNANCY: PREG TEST UR: POSITIVE — AB

## 2017-04-06 MED ORDER — FLUCONAZOLE 150 MG PO TABS: 150.0000 mg | ORAL_TABLET | Freq: Once | ORAL | 0 refills | Status: AC

## 2017-04-06 MED ORDER — LEVOTHYROXINE SODIUM 25 MCG PO TABS: 25.0000 ug | ORAL_TABLET | Freq: Every day | ORAL | 1 refills | Status: DC

## 2017-04-06 MED FILL — LEVOTHYROXINE 25 MCG TABLET: 25 | 30 days supply | Qty: 30 | Fill #0

## 2017-04-06 MED FILL — FLUCONAZOLE 150 MG TABLET: 150 | 2 days supply | Qty: 2 | Fill #0

## 2017-04-06 NOTE — Progress Notes (Signed)
Chief Complaint: Vaginal pain/itch  Subjective: This is a 41 year old female who went to the emergency department on 04/02/2017 with complaints of vaginal pain. She doesn't really have a vaginal discharge just painful down there. She is also having some burning at times. She is not sure when her last menstrual period was. She states that she was supposed to start August 30th. She is sexually active with one partner. They intermittently use condoms. She has 4 children. It looks like vaginal cultures were done but I cannot see the results of this. Nevertheless, she was treated with metronidazole. She states that since taking this medications he's had increasing fatigue. She also has been having some heartburn. She continues with the vaginal itch.  ROS:  GEN: denies fever or chills, denies change in weight Skin: denies lesions or rashes HEENT: denies headache, earache, epistaxis, sore throat, or neck pain LUNGS: denies SHOB, dyspnea, PND, orthopnea CV: denies CP or palpitations ABD: denies abd pain, N or V EXT: denies muscle spasms or swelling; no pain in lower ext, no weakness NEURO: denies numbness or tingling, denies sz, stroke or TIA   Objective:  Vitals:   04/06/17 0838  BP: 91/61  Pulse: 65  Temp: 98.3 F (36.8 C)  TempSrc: Oral  SpO2: 100%  Weight: 148 lb 12.8 oz (67.5 kg)    Physical Exam:  General: in no acute distress Abdomen: Soft, nontender, nondistended, positive bowel sounds. Extremities: No clubbing cyanosis or edema with positive pedal pulses. GU: no exam Neuro: Alert, awake, oriented x3, nonfocal.  Labs today-pretty normal urine dipstick; +urine pregnancy test  Medications: Prior to Admission medications   Medication Sig Start Date End Date Taking? Authorizing Provider  azelastine (OPTIVAR) 0.05 % ophthalmic solution Place 1 drop into both eyes 2 (two) times daily. 03/03/17  Yes Anders Simmonds, PA-C  levothyroxine (SYNTHROID, LEVOTHROID) 25 MCG tablet Take 1  tablet (25 mcg total) by mouth daily. 02/25/17  Yes Marcine Matar, MD  metroNIDAZOLE (FLAGYL) 500 MG tablet Take 1 tablet (500 mg total) by mouth 2 (two) times daily. 04/02/17 04/09/17 Yes Alene Mires, NP  cetirizine (ZYRTEC) 10 MG tablet Take 1 tablet (10 mg total) by mouth daily. Patient not taking: Reported on 04/06/2017 03/03/17   Anders Simmonds, PA-C  escitalopram (LEXAPRO) 10 MG tablet Take 1 tablet (10 mg total) by mouth daily. Patient not taking: Reported on 04/06/2017 11/23/16   Pete Glatter, MD  fluconazole (DIFLUCAN) 150 MG tablet Take 1 tablet (150 mg total) by mouth once. Take one tablet today, take 2nd tablet at completion of antibiotic 04/06/17 04/06/17  Vivianne Master, PA-C  fluticasone [facility]) 50 MCG/ACT nasal spray Place 2 sprays into both nostrils daily. Patient not taking: Reported on 04/20/2016 09/06/13   Acey Lav, MD  Vitamin D, Ergocalciferol, (DRISDOL) 50000 units CAPS capsule Take 1 capsule (50,000 Units total) by mouth every 7 (seven) days. Patient not taking: Reported on 12/04/2016 04/21/16   Pete Glatter, MD    Assessment: 1. Vaginal Itch 2. Positive urine pregnancy test   Plan: Diflucan Cont Metronidazole Check Serum HCG Referral to GYN  Follow up:2 weeks  The patient was given clear instructions to go to ER or return to medical center if symptoms don't improve, worsen or new problems develop. The patient verbalized understanding. The patient was told to call to get lab results if they haven't heard anything in the next week.   This note has been created with Teaching laboratory technician  and smart Lobbyistphrase technology. Any transcriptional errors are unintentional.   Scot Juniffany Ellis Mehaffey, PA-C 04/06/2017, 9:24 AM

## 2017-04-06 NOTE — Progress Notes (Signed)
Monica: [id number]  Follow up Burning and itching/ BV  ATB makes her feel sleepy and has double

## 2017-04-07 LAB — HCG, SERUM, QUALITATIVE: hCG,Beta Subunit,Qual,Serum: POSITIVE m[IU]/mL — AB (ref <6)

## 2017-04-07 LAB — URINE CYTOLOGY ANCILLARY ONLY
BACTERIAL VAGINITIS: NEGATIVE
Candida vaginitis: NEGATIVE
Chlamydia: NEGATIVE
NEISSERIA GONORRHEA: NEGATIVE
Trichomonas: NEGATIVE

## 2017-04-07 LAB — HCG, BETA SUBUNIT, QN (SERIAL): HCG, Beta Chain, Quant, S: 422 m[IU]/mL

## 2017-04-09 ENCOUNTER — Encounter (HOSPITAL_COMMUNITY): Payer: Self-pay

## 2017-04-09 ENCOUNTER — Emergency Department (HOSPITAL_COMMUNITY): Payer: Self-pay

## 2017-04-09 ENCOUNTER — Emergency Department (HOSPITAL_COMMUNITY)
Admission: EM | Admit: 2017-04-09 | Discharge: 2017-04-10 | Disposition: A | Discharge status: Home / Self Care | Payer: Self-pay | Attending: Emergency Medicine | Admitting: Emergency Medicine

## 2017-04-09 DIAGNOSIS — O26899 Other specified pregnancy related conditions, unspecified trimester: Secondary | ICD-10-CM

## 2017-04-09 DIAGNOSIS — E039 Hypothyroidism, unspecified: Secondary | ICD-10-CM | POA: Insufficient documentation

## 2017-04-09 DIAGNOSIS — Z79899 Other long term (current) drug therapy: Secondary | ICD-10-CM | POA: Insufficient documentation

## 2017-04-09 DIAGNOSIS — O4690 Antepartum hemorrhage, unspecified, unspecified trimester: Secondary | ICD-10-CM

## 2017-04-09 DIAGNOSIS — O26891 Other specified pregnancy related conditions, first trimester: Secondary | ICD-10-CM

## 2017-04-09 DIAGNOSIS — O200 Threatened abortion: Secondary | ICD-10-CM

## 2017-04-09 DIAGNOSIS — R109 Unspecified abdominal pain: Secondary | ICD-10-CM

## 2017-04-09 DIAGNOSIS — Z3A00 Weeks of gestation of pregnancy not specified: Secondary | ICD-10-CM | POA: Insufficient documentation

## 2017-04-09 LAB — COMPREHENSIVE METABOLIC PANEL
ALBUMIN: 3.8 g/dL (ref 3.5–5.0)
ALT: 22 U/L (ref 14–54)
AST: 24 U/L (ref 15–41)
Alkaline Phosphatase: 45 U/L (ref 38–126)
Anion gap: 7 (ref 5–15)
BUN: 7 mg/dL (ref 6–20)
CHLORIDE: 107 mmol/L (ref 101–111)
CO2: 22 mmol/L (ref 22–32)
CREATININE: 0.56 mg/dL (ref 0.44–1.00)
Calcium: 8.8 mg/dL — ABNORMAL LOW (ref 8.9–10.3)
GFR calc Af Amer: >60 mL/min (ref >60)
GLUCOSE: 136 mg/dL — AB (ref 65–99)
POTASSIUM: 3.3 mmol/L — AB (ref 3.5–5.1)
Sodium: 136 mmol/L (ref 135–145)
Total Bilirubin: 0.5 mg/dL (ref 0.3–1.2)
Total Protein: 6.8 g/dL (ref 6.5–8.1)

## 2017-04-09 LAB — CBC
HEMATOCRIT: 35.5 % — AB (ref 36.0–46.0)
Hemoglobin: 11.8 g/dL — ABNORMAL LOW (ref 12.0–15.0)
MCH: 28.5 pg (ref 26.0–34.0)
MCHC: 33.2 g/dL (ref 30.0–36.0)
MCV: 85.7 fL (ref 78.0–100.0)
PLATELETS: 384 10*3/uL (ref 150–400)
RBC: 4.14 MIL/uL (ref 3.87–5.11)
RDW: 14.2 % (ref 11.5–15.5)
WBC: 7.7 10*3/uL (ref 4.0–10.5)

## 2017-04-09 LAB — HCG, SERUM, QUALITATIVE: PREG SERUM: POSITIVE — AB

## 2017-04-09 LAB — WET PREP, GENITAL
Sperm: NONE SEEN
Trich, Wet Prep: NONE SEEN
Yeast Wet Prep HPF POC: NONE SEEN

## 2017-04-09 LAB — URINALYSIS, ROUTINE W REFLEX MICROSCOPIC
BILIRUBIN URINE: NEGATIVE
Glucose, UA: NEGATIVE mg/dL
Hgb urine dipstick: NEGATIVE
Ketones, ur: 5 mg/dL — AB
Nitrite: NEGATIVE
PH: 5 (ref 5.0–8.0)
Protein, ur: 30 mg/dL — AB
SPECIFIC GRAVITY, URINE: 1.021 (ref 1.005–1.030)

## 2017-04-09 LAB — LIPASE, BLOOD: LIPASE: 34 U/L (ref 11–51)

## 2017-04-09 LAB — I-STAT BETA HCG BLOOD, ED (MC, WL, AP ONLY): HCG, QUANTITATIVE: 469 m[IU]/mL — AB (ref <5)

## 2017-04-09 NOTE — ED Provider Notes (Signed)
[facility]   CSN: [id number] Arrival date & time: 04/09/17  1737     History   Chief Complaint Chief Complaint  Patient presents with  . Abdominal Pain    HPI Kristen Holt is a 41 y.o. female.  Patient presents to the ED with a chief complaint of abdominal cramps x 3 days.  She reports associated vaginal bleeding this morning.  She denies any other discharge.  She states that she has some cramps in her back as well.  She reports some associated nausea and diarrhea, but denies any vomiting.  She has not taken anything for her symptoms.  There are no other associated symptoms or modifying factors.   The history is provided by the patient. No language interpreter was used.    Past Medical History:  Diagnosis Date  . Acute appendicitis 01/13/2012  . Diabetes mellitus    pre diabetic   . Thyroid disease     Patient Active Problem List   Diagnosis Date Noted  . Depression 01/01/2017  . Hypothyroid 01/01/2017  . Pap smear for cervical cancer screening 08/21/2013  . Allergic rhinitis 02/01/2013  . Acute appendicitis 01/13/2012    Past Surgical History:  Procedure Laterality Date  . APPENDECTOMY    . LAPAROSCOPIC APPENDECTOMY  01/18/2012   Procedure: APPENDECTOMY LAPAROSCOPIC;  Surgeon: Currie Paris, MD;  Location: [facility];  Service: General;  Laterality: N/A;    OB History    No data available       Home Medications    Prior to Admission medications   Medication Sig Start Date End Date Taking? Authorizing Provider  azelastine (OPTIVAR) 0.05 % ophthalmic solution Place 1 drop into both eyes 2 (two) times daily. 03/03/17   Anders Simmonds, PA-C  cetirizine (ZYRTEC) 10 MG tablet Take 1 tablet (10 mg total) by mouth daily. Patient not taking: Reported on 04/06/2017 03/03/17   Anders Simmonds, PA-C  escitalopram (LEXAPRO) 10 MG tablet Take 1 tablet (10 mg total) by mouth daily. Patient not taking: Reported on 04/06/2017 11/23/16    Pete Glatter, MD  fluticasone [facility]) 50 MCG/ACT nasal spray Place 2 sprays into both nostrils daily. Patient not taking: Reported on 04/20/2016 09/06/13   Acey Lav, MD  levothyroxine (SYNTHROID, LEVOTHROID) 25 MCG tablet Take 1 tablet (25 mcg total) by mouth daily. 04/06/17   Vivianne Master, PA-C  metroNIDAZOLE (FLAGYL) 500 MG tablet Take 1 tablet (500 mg total) by mouth 2 (two) times daily. 04/02/17 04/09/17  Alene Mires, NP  Vitamin D, Ergocalciferol, (DRISDOL) 50000 units CAPS capsule Take 1 capsule (50,000 Units total) by mouth every 7 (seven) days. Patient not taking: Reported on 12/04/2016 04/21/16   Pete Glatter, MD    Family History Family History  Problem Relation Age of Onset  . Diabetes Mother     Social History Social History  Substance Use Topics  . Smoking status: Never Smoker  . Smokeless tobacco: Never Used  . Alcohol use No     Allergies   Patient has no known allergies.   Review of Systems Review of Systems  All other systems reviewed and are negative.    Physical Exam Updated Vital Signs BP 123/75   Pulse 65   Temp 98.5 F (36.9 C) (Oral)   Resp 16   Ht  (1.626 m)   Wt 67.1 kg (148 lb)   LMP 02/28/2017 (Within Weeks)   SpO2 100%   BMI 25.40 kg/m  Physical Exam  Constitutional: She is oriented to person, place, and time. She appears well-developed and well-nourished.  HENT:  Head: Normocephalic and atraumatic.  Eyes: Pupils are equal, round, and reactive to light. Conjunctivae and EOM are normal.  Neck: Normal range of motion. Neck supple.  Cardiovascular: Normal rate and regular rhythm.  Exam reveals no gallop and no friction rub.   No murmur heard. Pulmonary/Chest: Effort normal and breath sounds normal. No respiratory distress. She has no wheezes. She has no rales. She exhibits no tenderness.  Abdominal: Soft. Bowel sounds are normal. She exhibits no distension and no mass. There is no tenderness. There is no  rebound and no guarding.  Musculoskeletal: Normal range of motion. She exhibits no edema or tenderness.  Neurological: She is alert and oriented to person, place, and time.  Skin: Skin is warm and dry.  Psychiatric: She has a normal mood and affect. Her behavior is normal. Judgment and thought content normal.  Nursing note and vitals reviewed.    ED Treatments / Results  Labs (all labs ordered are listed, but only abnormal results are displayed) Labs Reviewed  COMPREHENSIVE METABOLIC PANEL - Abnormal; Notable for the following:       Result Value   Potassium 3.3 (*)    Glucose, Bld 136 (*)    Calcium 8.8 (*)    All other components within normal limits  CBC - Abnormal; Notable for the following:    Hemoglobin 11.8 (*)    HCT 35.5 (*)    All other components within normal limits  URINALYSIS, ROUTINE W REFLEX MICROSCOPIC - Abnormal; Notable for the following:    APPearance HAZY (*)    Ketones, ur 5 (*)    Protein, ur 30 (*)    Leukocytes, UA SMALL (*)    Bacteria, UA FEW (*)    Squamous Epithelial / LPF 6-30 (*)    All other components within normal limits  HCG, SERUM, QUALITATIVE - Abnormal; Notable for the following:    Preg, Serum POSITIVE (*)    All other components within normal limits  I-STAT BETA HCG BLOOD, ED ([facility], [facility], [facility] ONLY) - Abnormal; Notable for the following:    I-stat hCG, quantitative 469.0 (*)    All other components within normal limits  WET PREP, GENITAL  LIPASE, BLOOD  GC/CHLAMYDIA PROBE AMP ([place]) NOT AT [facility]    EKG  EKG Interpretation None       Radiology US Ob Comp Less 14 Wks  Result Date: 04/09/2017 CLINICAL DATA:  Pregnant patient in first-trimester pregnancy with right lower quadrant pain and vaginal spotting. EXAM: OBSTETRIC <14 WK [place] AND TRANSVAGINAL OB US TECHNIQUE: Both transabdominal and transvaginal ultrasound examinations were performed for complete evaluation of the gestation as well as the maternal uterus, adnexal regions,  and pelvic cul-de-sac. Transvaginal technique was performed to assess early pregnancy. COMPARISON:  None. FINDINGS: Intrauterine gestational sac: Single Yolk sac:  Visualized. Embryo:  Not Visualized. Cardiac Activity: Not Visualized. MSD: 4.3  mm   5 w   1  d Subchorionic hemorrhage:  None visualized. Maternal uterus/adnexae: The left ovary is normal and contains a subcentimeter cyst/follicle. Hypoechoic 1.8 cm region in the right ovary may be a corpus luteum. No extra ovarian pelvic mass. No pelvic free fluid. IMPRESSION: 1. Early intrauterine gestational sac with a probable yolk sac, but no fetal pole or cardiac activity yet visualized. Recommend follow-up quantitative B-HCG levels and follow-up US in 10-14 days to assess viability. This recommendation follows  SRU consensus guidelines: Diagnostic Criteria for Nonviable Pregnancy Early in the First Trimester. Malva Limes Engl J Med 2013; 063:0160-10; 369:1443-51. 2. No subchorionic hemorrhage. Electronically Signed   By: Rubye OaksMelanie  Ehinger M.D.   On: 04/09/2017 20:58   [place] Ob Transvaginal  Result Date: 04/09/2017 CLINICAL DATA:  Pregnant patient in first-trimester pregnancy with right lower quadrant pain and vaginal spotting. EXAM: OBSTETRIC <14 WK US AND TRANSVAGINAL OB US TECHNIQUE: Both transabdominal and transvaginal ultrasound examinations were performed for complete evaluation of the gestation as well as the maternal uterus, adnexal regions, and pelvic cul-de-sac. Transvaginal technique was performed to assess early pregnancy. COMPARISON:  None. FINDINGS: Intrauterine gestational sac: Single Yolk sac:  Visualized. Embryo:  Not Visualized. Cardiac Activity: Not Visualized. MSD: 4.3  mm   5 w   1  d Subchorionic hemorrhage:  None visualized. Maternal uterus/adnexae: The left ovary is normal and contains a subcentimeter cyst/follicle. Hypoechoic 1.8 cm region in the right ovary may be a corpus luteum. No extra ovarian pelvic mass. No pelvic free fluid. IMPRESSION: 1. Early  intrauterine gestational sac with a probable yolk sac, but no fetal pole or cardiac activity yet visualized. Recommend follow-up quantitative B-HCG levels and follow-up US in 10-14 days to assess viability. This recommendation follows SRU consensus guidelines: Diagnostic Criteria for Nonviable Pregnancy Early in the First Trimester. Malva Limes Engl J Med 2013; 932:3557-32; 369:1443-51. 2. No subchorionic hemorrhage. Electronically Signed   By: Rubye OaksMelanie  Ehinger M.D.   On: 04/09/2017 20:58    Procedures Procedures (including critical care time)  Medications Ordered in ED Medications - No data to display   Initial Impression / Assessment and Plan / ED Course  I have reviewed the triage vital signs and the nursing notes.  Pertinent labs & imaging results that were available during my care of the patient were reviewed by me and considered in my medical decision making (see chart for details).    Patient with lower abdominal pain, cramping, and bleeding in early pregnancy.  Likely threatened miscarriage.  US shows early intrauterine sac, but will need to trend HCGs.  Will check ABO/RH.  RH positive.  Doesn't need rhogam.  Will need repeat HCG in 2 days.    Final Clinical Impressions(s) / ED Diagnoses   Final diagnoses:  Abdominal pain in pregnancy    New Prescriptions New Prescriptions   No medications on file     Roxy HorsemanBrowning, Brigetta Beckstrom, Cordelia Poche-C 04/10/17 0247    Loren RacerYelverton, David, MD 04/10/17 1723

## 2017-04-09 NOTE — ED Triage Notes (Signed)
Per pt, PT is coming from home with complaints of bilateral abdominal pain that started three days ago. Pt reports missing her August period. Reports some vaginal discharge that is clear with a little spotting.

## 2017-04-10 MED ORDER — HYDROCODONE-ACETAMINOPHEN 5-325 MG PO TABS: 2.0000 | ORAL_TABLET | Freq: Four times a day (QID) | ORAL | 0 refills | Status: DC | PRN

## 2017-04-10 NOTE — ED Notes (Signed)
Pt stable, ambulatory, states understanding of discharge instructions, interpreter used through ipad

## 2017-04-11 LAB — GC/CHLAMYDIA PROBE AMP (~~LOC~~) NOT AT ARMC
Chlamydia: NEGATIVE
Neisseria Gonorrhea: NEGATIVE

## 2017-04-11 LAB — ABO/RH: ABO/RH(D): O POS

## 2017-04-12 ENCOUNTER — Encounter (HOSPITAL_COMMUNITY): Payer: Self-pay | Admitting: *Deleted

## 2017-04-12 ENCOUNTER — Inpatient Hospital Stay (HOSPITAL_COMMUNITY)
Admission: AD | Admit: 2017-04-12 | Discharge: 2017-04-12 | Disposition: A | Discharge status: Home / Self Care | Payer: Self-pay | Source: Ambulatory Visit | Attending: Family Medicine | Admitting: Family Medicine

## 2017-04-12 DIAGNOSIS — O034 Incomplete spontaneous abortion without complication: Secondary | ICD-10-CM

## 2017-04-12 DIAGNOSIS — O039 Complete or unspecified spontaneous abortion without complication: Secondary | ICD-10-CM | POA: Insufficient documentation

## 2017-04-12 LAB — WET PREP, GENITAL
Clue Cells Wet Prep HPF POC: NONE SEEN
SPERM: NONE SEEN
TRICH WET PREP: NONE SEEN
YEAST WET PREP: NONE SEEN

## 2017-04-12 LAB — CBC
HCT: 35.4 % — ABNORMAL LOW (ref 36.0–46.0)
HEMOGLOBIN: 11.9 g/dL — AB (ref 12.0–15.0)
MCH: 29.2 pg (ref 26.0–34.0)
MCHC: 33.6 g/dL (ref 30.0–36.0)
MCV: 86.8 fL (ref 78.0–100.0)
Platelets: 364 10*3/uL (ref 150–400)
RBC: 4.08 MIL/uL (ref 3.87–5.11)
RDW: 14.7 % (ref 11.5–15.5)
WBC: 9.2 10*3/uL (ref 4.0–10.5)

## 2017-04-12 LAB — URINALYSIS, ROUTINE W REFLEX MICROSCOPIC

## 2017-04-12 LAB — HCG, QUANTITATIVE, PREGNANCY: HCG, BETA CHAIN, QUANT, S: 395 m[IU]/mL — AB (ref <5)

## 2017-04-12 LAB — URINALYSIS, MICROSCOPIC (REFLEX)
SQUAMOUS EPITHELIAL / LPF: NONE SEEN
WBC, UA: NONE SEEN WBC/hpf (ref 0–5)

## 2017-04-12 NOTE — MAU Note (Signed)
Having pain in low back and lower abd, started a wk ago.  Vag bleeding, started 3 days ago, only in the mornings was very light.  Became heavier yesterday afternoon. [facility] .Marland Kitchen

## 2017-04-12 NOTE — MAU Provider Note (Signed)
Chief Complaint: Vaginal Bleeding; Abdominal Pain; and Back Pain   SUBJECTIVE HPI: Kristen Holt is a 41 y.o. G5P4004 at [redacted]w[redacted]d who presents to MAU with pain in the low back and lower abdomen that started a week ago. She started having vaginal bleeding associated with this 3 days ago but it was very light. Bleeding became heavier yesterday evening. Patient states that the bleeding is heavier than a period. She has passed some small clots. Abdominal pain localized to her lower abdomen feels like cramping and sharp. Denies fevers, n/v, dysuria.   Past Medical History:  Diagnosis Date  . Acute appendicitis 01/13/2012  . Thyroid disease    OB History  Gravida Para Term Preterm AB Living  SAB TAB Ectopic Multiple Live Births               # Outcome Date GA Lbr Len/2nd Weight Sex Delivery Anes PTL Lv  5 Current           4 Term           3 Term           2 Term           1 Term              Past Surgical History:  Procedure Laterality Date  . APPENDECTOMY    . LAPAROSCOPIC APPENDECTOMY  01/18/2012   Procedure: APPENDECTOMY LAPAROSCOPIC;  Surgeon: Currie Paris, MD;  Location: [facility] OR;  Service: General;  Laterality: N/A;   Social History   Social History  . Marital status: Single    Spouse name: N/A  . Number of children: N/A  . Years of education: N/A   Occupational History  . Not on file.   Social History Main Topics  . Smoking status: Never Smoker  . Smokeless tobacco: Never Used  . Alcohol use No  . Drug use: No  . Sexual activity: Yes    Partners: Male   Other Topics Concern  . Not on file   Social History Narrative   ** Merged History Encounter **       No current facility-administered medications on file prior to encounter.    Current Outpatient Prescriptions on File Prior to Encounter  Medication Sig Dispense Refill  . HYDROcodone-acetaminophen (NORCO/VICODIN) 5-325 MG tablet Take 2 tablets by mouth every 6 (six) hours as needed.  10 tablet 0  . levothyroxine (SYNTHROID, LEVOTHROID) 25 MCG tablet Take 1 tablet (25 mcg total) by mouth daily. 90 tablet 1  . escitalopram (LEXAPRO) 10 MG tablet Take 1 tablet (10 mg total) by mouth daily. (Patient not taking: Reported on 04/06/2017) 30 tablet 1  . Vitamin D, Ergocalciferol, (DRISDOL) 50000 units CAPS capsule Take 1 capsule (50,000 Units total) by mouth every 7 (seven) days. (Patient not taking: Reported on 12/04/2016) 12 capsule 0  . [DISCONTINUED] Ranitidine HCl (ZANTAC PO) Take by mouth.     No Known Allergies  I have reviewed the past Medical Hx, Surgical Hx, Social Hx, Allergies and Medications.   REVIEW OF SYSTEMS All systems reviewed and are negative for acute change except as noted in the HPI.   OBJECTIVE BP 114/64 (BP Location: Left Arm)   Pulse 63   Temp 98.2 F (36.8 C) (Oral)   Resp 18   Wt 66.9 kg (147 lb 8 oz)   LMP 02/28/2017   SpO2 100%   BMI 25.32 kg/m    PHYSICAL EXAM  Constitutional: Well-developed, well-nourished female in no acute distress.  Cardiovascular: normal rate and rhythm, pulses intact Respiratory: normal rate and effort.  GI: Abd soft, non-tender, non-distended. No guarding or rebound.  MS: Extremities nontender, no edema, normal ROM Neurologic: Alert and oriented x 4. No focal deficits GU: Neg CVAT. SPECULUM EXAM: NEFG, physiologic discharge, dark blood noted from cervix, os appears dialted BIMANUAL: cervix 1cm dialted, no adnexal tenderness or masses. No CMT.  LAB RESULTS Results for orders placed or performed during the hospital encounter of 04/12/17 (from the past 24 hour(s))  Urinalysis, Routine w reflex microscopic     Status: Abnormal   Collection Time: 04/12/17 10:20 AM  Result Value Ref Range   Color, Urine RED (A) YELLOW   APPearance TURBID (A) CLEAR   Specific Gravity, Urine  1.005 - 1.030    TEST NOT REPORTED DUE TO COLOR INTERFERENCE OF URINE PIGMENT   pH  5.0 - 8.0    TEST NOT REPORTED DUE TO COLOR INTERFERENCE  OF URINE PIGMENT   Glucose, UA (A) NEGATIVE mg/dL    TEST NOT REPORTED DUE TO COLOR INTERFERENCE OF URINE PIGMENT   Hgb urine dipstick (A) NEGATIVE    TEST NOT REPORTED DUE TO COLOR INTERFERENCE OF URINE PIGMENT   Bilirubin Urine (A) NEGATIVE    TEST NOT REPORTED DUE TO COLOR INTERFERENCE OF URINE PIGMENT   Ketones, ur (A) NEGATIVE mg/dL    TEST NOT REPORTED DUE TO COLOR INTERFERENCE OF URINE PIGMENT   Protein, ur (A) NEGATIVE mg/dL    TEST NOT REPORTED DUE TO COLOR INTERFERENCE OF URINE PIGMENT   Nitrite (A) NEGATIVE    TEST NOT REPORTED DUE TO COLOR INTERFERENCE OF URINE PIGMENT   Leukocytes, UA (A) NEGATIVE    TEST NOT REPORTED DUE TO COLOR INTERFERENCE OF URINE PIGMENT  Urinalysis, Microscopic (reflex)     Status: Abnormal   Collection Time: 04/12/17 10:20 AM  Result Value Ref Range   RBC / HPF TOO NUMEROUS TO COUNT 0 - 5 RBC/hpf   WBC, UA NONE SEEN 0 - 5 WBC/hpf   Bacteria, UA RARE (A) NONE SEEN   Squamous Epithelial / LPF NONE SEEN NONE SEEN  Wet prep, genital     Status: Abnormal   Collection Time: 04/12/17 11:15 AM  Result Value Ref Range   Yeast Wet Prep HPF POC NONE SEEN NONE SEEN   Trich, Wet Prep NONE SEEN NONE SEEN   Clue Cells Wet Prep HPF POC NONE SEEN NONE SEEN   WBC, Wet Prep HPF POC FEW (A) NONE SEEN   Sperm NONE SEEN   CBC     Status: Abnormal   Collection Time: 04/12/17 12:19 PM  Result Value Ref Range   WBC 9.2 4.0 - 10.5 K/uL   RBC 4.08 3.87 - 5.11 MIL/uL   Hemoglobin 11.9 (L) 12.0 - 15.0 g/dL   HCT 16.135.4 (L) 09.636.0 - 04.546.0 %   MCV 86.8 78.0 - 100.0 fL   MCH 29.2 26.0 - 34.0 pg   MCHC 33.6 30.0 - 36.0 g/dL   RDW 40.914.7 81.111.5 - 91.415.5 %   Platelets 364 150 - 400 K/uL  hCG, quantitative, pregnancy     Status: Abnormal   Collection Time: 04/12/17 12:19 PM  Result Value Ref Range   hCG, Beta Chain, Quant, S 395 (H) <5 mIU/mL    IMAGING [place] Ob Comp Less 14 Wks  Result Date: 04/09/2017 CLINICAL DATA:  Pregnant patient in first-trimester pregnancy with right  lower quadrant pain  and vaginal spotting. EXAM: OBSTETRIC <14 WK [place] AND TRANSVAGINAL OB US TECHNIQUE: Both transabdominal and transvaginal ultrasound examinations were performed for complete evaluation of the gestation as well as the maternal uterus, adnexal regions, and pelvic cul-de-sac. Transvaginal technique was performed to assess early pregnancy. COMPARISON:  None. FINDINGS: Intrauterine gestational sac: Single Yolk sac:  Visualized. Embryo:  Not Visualized. Cardiac Activity: Not Visualized. MSD: 4.3  mm   5 w   1  d Subchorionic hemorrhage:  None visualized. Maternal uterus/adnexae: The left ovary is normal and contains a subcentimeter cyst/follicle. Hypoechoic 1.8 cm region in the right ovary may be a corpus luteum. No extra ovarian pelvic mass. No pelvic free fluid. IMPRESSION: 1. Early intrauterine gestational sac with a probable yolk sac, but no fetal pole or cardiac activity yet visualized. Recommend follow-up quantitative B-HCG levels and follow-up US in 10-14 days to assess viability. This recommendation follows SRU consensus guidelines: Diagnostic Criteria for Nonviable Pregnancy Early in the First Trimester. Malva Limes Med 2013; 010:2725-36. 2. No subchorionic hemorrhage. Electronically Signed   By: Rubye Oaks M.D.   On: 04/09/2017 20:58   US Ob Transvaginal  Result Date: 04/09/2017 CLINICAL DATA:  Pregnant patient in first-trimester pregnancy with right lower quadrant pain and vaginal spotting. EXAM: OBSTETRIC <14 WK [place] AND TRANSVAGINAL OB US TECHNIQUE: Both transabdominal and transvaginal ultrasound examinations were performed for complete evaluation of the gestation as well as the maternal uterus, adnexal regions, and pelvic cul-de-sac. Transvaginal technique was performed to assess early pregnancy. COMPARISON:  None. FINDINGS: Intrauterine gestational sac: Single Yolk sac:  Visualized. Embryo:  Not Visualized. Cardiac Activity: Not Visualized. MSD: 4.3  mm   5 w   1  d Subchorionic  hemorrhage:  None visualized. Maternal uterus/adnexae: The left ovary is normal and contains a subcentimeter cyst/follicle. Hypoechoic 1.8 cm region in the right ovary may be a corpus luteum. No extra ovarian pelvic mass. No pelvic free fluid. IMPRESSION: 1. Early intrauterine gestational sac with a probable yolk sac, but no fetal pole or cardiac activity yet visualized. Recommend follow-up quantitative B-HCG levels and follow-up US in 10-14 days to assess viability. This recommendation follows SRU consensus guidelines: Diagnostic Criteria for Nonviable Pregnancy Early in the First Trimester. Malva Limes Med 2013; 644:0347-42. 2. No subchorionic hemorrhage. Electronically Signed   By: Rubye Oaks M.D.   On: 04/09/2017 20:58    MAU COURSE Vitals and nursing notes reviewed I have ordered labs and reviewed them Patient without UTI or infection on wet prep Vaginal cultures pending CBC without leukocytosis; hemoglobin stable Repeat B-hCG with decreasing values Reviewed [place] from 3 days ago without signs of cardiac activity or fetal pole  MDM Plan of care reviewed with patient, including labs and tests ordered and medical treatment.  Discussed concern for miscarriage. Patient decided to do expectant management vs medication.   ASSESSMENT 1. Inevitable spontaneous abortion     PLAN Discharge home in stable condition Given miscarriage return precautions Expectant management Message sent to clinic staff to schedule follow-up appointment with provider in one week Handout given In-person interpretor used   Caryl Ada, DO [facility], [facility] [place] 04/12/2017, 4:17 PM

## 2017-04-12 NOTE — Discharge Instructions (Signed)
Aborto espontáneo °(Miscarriage) °El aborto espontáneo es la pérdida de un bebé que no ha nacido.(feto) antes de la semana 20 del embarazo. La causa generalmente es desconocida. °CUIDADOS EN EL HOGAR °· Debe permanecer en cama (reposo en cama) o podrá hacer actividades livianas. Regrese a sus actividades según las indicaciones del médico. °· Pida ayuda con las tareas domésticas. °· Anote cuántos apósitos usa por día. Describa el grado en que están empapados. °· No use tampones. No se higienice la vagina (duchas vaginales) ni tenga relaciones sexuales (coito) hasta que el médico la autorice. °· Sólo debe tomar la medicación según las indicaciones del médico. °· No tome aspirina. °· Cumpla con los controles médicos según las indicaciones. °· Si usted o su pareja tienen problemas con el duelo, hable con su médico. También puede intentar con psicoterapia. Permítase el tiempo suficiente de duelo antes de quedar embarazada nuevamente. ° °SOLICITE AYUDA DE INMEDIATO SI: °· Siente cólicos intensos o dolor en el estómago, en la espalda o en el vientre (abdomen). °· Tiene fiebre. °· Elimina grumos de sangre (coágulos) por la vagina, que tienen el tamaño de una nuez o más. Guarde los coágulos para que el médico los vea. °· Elimina gran cantidad de tejidos por la vagina. Guarde lo que ha eliminado para que su médico lo examine. °· Aumenta el sangrado. °· Observa una secreción espesa, con mal olor (pérdida) que proviene de la vagina. °· Se siente mareada, débil o se desvanece (se desmaya). °· Siente escalofríos. ° °ASEGÚRESE DE QUE: °· Comprende estas instrucciones. °· Controlará su enfermedad. °· Solicitará ayuda de inmediato si no mejora o si empeora. ° °Esta información no tiene como fin reemplazar el consejo del médico. Asegúrese de hacerle al médico cualquier pregunta que tenga. °Document Released: 01/18/2012 Document Revised: 01/18/2012 Document Reviewed: 08/19/2011 °Elsevier Interactive Patient Education © 2017 Elsevier  Inc. ° °

## 2017-04-13 ENCOUNTER — Telehealth: Payer: Self-pay | Admitting: *Deleted

## 2017-04-13 LAB — GC/CHLAMYDIA PROBE AMP (~~LOC~~) NOT AT ARMC
CHLAMYDIA, DNA PROBE: NEGATIVE
NEISSERIA GONORRHEA: NEGATIVE

## 2017-04-13 NOTE — Telephone Encounter (Signed)
Notes recorded by Vivianne MasterNoel, Tiffany S, PA-C on 04/11/2017 at 8:03 AM EDT Please let her know that we have confirmed her pregnancy test as being positive. She should start taking OTC prenatal vitamins. She needs to see her OB GYN ASAP. Thanks.  Left message on voicemail, with assistance of [employer], [place], [phone].

## 2017-04-14 NOTE — Telephone Encounter (Signed)
Unable to reach. Left message on voicemail to return call.

## 2017-04-27 ENCOUNTER — Encounter: Payer: Self-pay | Admitting: Medical

## 2017-04-27 ENCOUNTER — Ambulatory Visit (INDEPENDENT_AMBULATORY_CARE_PROVIDER_SITE_OTHER): Payer: Self-pay | Admitting: Medical

## 2017-04-27 ENCOUNTER — Telehealth: Payer: Self-pay | Admitting: Medical

## 2017-04-27 VITALS — BP 109/74 | HR 62 | Wt 147.4 lb

## 2017-04-27 DIAGNOSIS — O039 Complete or unspecified spontaneous abortion without complication: Secondary | ICD-10-CM

## 2017-04-27 LAB — HCG, QUANTITATIVE, PREGNANCY: hCG, Beta Chain, Quant, S: 1 m[IU]/mL (ref <5)

## 2017-04-27 NOTE — Telephone Encounter (Signed)
LM with [employer] to return call to [facility] for results. Patient had hCG of 1 today, so miscarriage is complete. She can be scheduled for Nexplanon insertion or whatever birth control she would like.   Marny Lowenstein, PA-C 04/27/2017 4:13 PM

## 2017-04-27 NOTE — Progress Notes (Signed)
  History:  Ms. Kristen Holt is a 41 y.o. [id number] who presents to clinic today for follow-up from MAU. The patient was seen in MAU with vaginal bleeding. The patient had a slight decline in hCG at that time. She states that bleeding continued for ~ 1 week. She denies bleeding today. She continues to have lower abdominal cramping and low back pain. She rates her pain at 5/10 today. She is taking Advil with some relief. She states occasional subjective fevers.    The following portions of the patient's history were reviewed and updated as appropriate: allergies, current medications, family history, past medical history, social history, past surgical history and problem list.  Review of Systems:  Review of Systems  Constitutional: Positive for fever. Negative for malaise/fatigue.  Gastrointestinal: Positive for abdominal pain. Negative for constipation, diarrhea, nausea and vomiting.  Genitourinary: Negative for dysuria, frequency and urgency.       Neg - vaginal bleeding      Objective:  Physical Exam BP 109/74   Pulse 62   Wt 147 lb 6.4 oz (66.9 kg)   LMP 02/28/2017   Breastfeeding? Unknown   BMI 25.30 kg/m  Physical Exam  Constitutional: She is oriented to person, place, and time. She appears well-developed and well-nourished. No distress.  HENT:  Head: Normocephalic.  Cardiovascular: Normal rate.   Pulmonary/Chest: Effort normal.  Neurological: She is alert and oriented to person, place, and time.  Skin: Skin is warm and dry. No erythema.  Psychiatric: She has a normal mood and affect.  Vitals reviewed.  MDM Last 2 hCG levels did not show significant decline. Will repeat today to confirm SAB.  Discussed birth control options. Patient may want Nexplanon. Will set up for birth control initiation when hCG results are final.    Assessment & Plan:  1. SAB (spontaneous abortion) - hCG, quantitative, pregnancy - Continue Advil PRN for pain - Follow-up with [facility]  depending on hCG results   Marny Lowenstein, PA-C 04/27/2017 2:43 PM

## 2017-04-27 NOTE — Patient Instructions (Signed)
Aborto espontáneo °(Miscarriage) °El aborto espontáneo es la pérdida de un bebé que no ha nacido.(feto) antes de la semana 20 del embarazo. La causa generalmente es desconocida. °CUIDADOS EN EL HOGAR °· Debe permanecer en cama (reposo en cama) o podrá hacer actividades livianas. Regrese a sus actividades según las indicaciones del médico. °· Pida ayuda con las tareas domésticas. °· Anote cuántos apósitos usa por día. Describa el grado en que están empapados. °· No use tampones. No se higienice la vagina (duchas vaginales) ni tenga relaciones sexuales (coito) hasta que el médico la autorice. °· Sólo debe tomar la medicación según las indicaciones del médico. °· No tome aspirina. °· Cumpla con los controles médicos según las indicaciones. °· Si usted o su pareja tienen problemas con el duelo, hable con su médico. También puede intentar con psicoterapia. Permítase el tiempo suficiente de duelo antes de quedar embarazada nuevamente. ° °SOLICITE AYUDA DE INMEDIATO SI: °· Siente cólicos intensos o dolor en el estómago, en la espalda o en el vientre (abdomen). °· Tiene fiebre. °· Elimina grumos de sangre (coágulos) por la vagina, que tienen el tamaño de una nuez o más. Guarde los coágulos para que el médico los vea. °· Elimina gran cantidad de tejidos por la vagina. Guarde lo que ha eliminado para que su médico lo examine. °· Aumenta el sangrado. °· Observa una secreción espesa, con mal olor (pérdida) que proviene de la vagina. °· Se siente mareada, débil o se desvanece (se desmaya). °· Siente escalofríos. ° °ASEGÚRESE DE QUE: °· Comprende estas instrucciones. °· Controlará su enfermedad. °· Solicitará ayuda de inmediato si no mejora o si empeora. ° °Esta información no tiene como fin reemplazar el consejo del médico. Asegúrese de hacerle al médico cualquier pregunta que tenga. °Document Released: 01/18/2012 Document Revised: 01/18/2012 Document Reviewed: 08/19/2011 °Elsevier Interactive Patient Education © 2017 Elsevier  Inc. ° °

## 2017-04-28 ENCOUNTER — Encounter: Payer: Self-pay | Admitting: *Deleted

## 2017-04-28 NOTE — Progress Notes (Signed)
Came to office wanting to talk with a nurse. She states she wanted to get bhcg results from yesterday- she did not get the phone call message. I explained her bhcg was back to normal meaning miscarriage complete. Explained it is recommended she not get pregnant for a few months- can use condoms. Discussed her preferred method of birth control which is nexplanon. Explained she would have to pay up front because [facility] financial assistance will not cover that . Explained she can go to health department as they have programs to assist with birth control. She voices understanding

## 2017-05-10 ENCOUNTER — Ambulatory Visit: Payer: Self-pay | Admitting: Internal Medicine

## 2017-05-10 ENCOUNTER — Other Ambulatory Visit: Payer: Self-pay | Admitting: Internal Medicine

## 2017-05-10 MED ORDER — LEVOTHYROXINE SODIUM 25 MCG PO TABS: 25.0000 ug | ORAL_TABLET | Freq: Every day | ORAL | 1 refills | Status: DC

## 2017-05-10 MED FILL — LEVOTHYROXINE 25 MCG TABLET: 25 | 30 days supply | Qty: 30 | Fill #1

## 2017-05-13 ENCOUNTER — Ambulatory Visit: Payer: Self-pay | Attending: Internal Medicine

## 2017-06-01 ENCOUNTER — Other Ambulatory Visit: Payer: Self-pay | Admitting: Obstetrics & Gynecology

## 2017-06-01 DIAGNOSIS — Z1231 Encounter for screening mammogram for malignant neoplasm of breast: Secondary | ICD-10-CM

## 2017-06-20 ENCOUNTER — Telehealth: Payer: Self-pay | Admitting: Internal Medicine

## 2017-06-20 ENCOUNTER — Encounter: Payer: Self-pay | Admitting: Obstetrics and Gynecology

## 2017-06-20 ENCOUNTER — Telehealth: Payer: Self-pay | Admitting: *Deleted

## 2017-06-20 NOTE — Telephone Encounter (Signed)
Levothyroxine sent to pharmacy 05/10/17. Confirmed pharmacy has it. Will contact pharmacy to fill it.

## 2017-06-20 NOTE — Telephone Encounter (Addendum)
Called [place] with [employer] [phone] and left message we were calling re: appointment with a female  to see if needs to cancel or having a problem. ( per discussion with Dr. Earlene Plateravis does not need to be seen since she had a miscarriage unless she has an issue she is requesting to be seen for).

## 2017-06-20 NOTE — Telephone Encounter (Signed)
Pt came in to request a refill on  -levothyroxine (SYNTHROID, LEVOTHROID) 25 MCG tablet  [facility] Please follow up thanks

## 2017-06-27 MED FILL — LEVOTHYROXINE 25 MCG TABLET: 25 | 30 days supply | Qty: 30 | Fill #0

## 2017-06-30 ENCOUNTER — Ambulatory Visit
Admission: RE | Admit: 2017-06-30 | Discharge: 2017-06-30 | Disposition: A | Discharge status: Home / Self Care | Payer: No Typology Code available for payment source | Source: Ambulatory Visit | Attending: Obstetrics & Gynecology | Admitting: Obstetrics & Gynecology

## 2017-06-30 DIAGNOSIS — Z1231 Encounter for screening mammogram for malignant neoplasm of breast: Secondary | ICD-10-CM

## 2017-07-06 ENCOUNTER — Encounter (HOSPITAL_COMMUNITY): Payer: Self-pay | Admitting: Emergency Medicine

## 2017-07-06 ENCOUNTER — Ambulatory Visit (HOSPITAL_COMMUNITY)
Admission: EM | Admit: 2017-07-06 | Discharge: 2017-07-06 | Disposition: A | Discharge status: Home / Self Care | Payer: No Typology Code available for payment source | Attending: Family Medicine | Admitting: Family Medicine

## 2017-07-06 DIAGNOSIS — H9201 Otalgia, right ear: Secondary | ICD-10-CM

## 2017-07-06 NOTE — ED Provider Notes (Signed)
[facility]    CSN: [id number] Arrival date & time: 07/06/17  1000     History   Chief Complaint Chief Complaint  Patient presents with  . Foreign Body in Ear    HPI Kristen Holt is a 41 y.o. female.   Kristen Holt presents with concerns that she has an earring back in her right ear. She feels like it occurred approximately two days ago, she thought she could feel it yesterday and in trying to remove it pushed it further in. Does not know how it would have gotten into her hear. Denies pain, but states she has a mild burning sensation to ear. Without drainage, denies decreased hearing, without URI symptoms.    ROS per HPI.       Past Medical History:  Diagnosis Date  . Acute appendicitis 01/13/2012  . Thyroid disease     Patient Active Problem List   Diagnosis Date Noted  . Depression 01/01/2017  . Hypothyroid 01/01/2017  . Pap smear for cervical cancer screening 08/21/2013  . Allergic rhinitis 02/01/2013  . Acute appendicitis 01/13/2012    Past Surgical History:  Procedure Laterality Date  . APPENDECTOMY    . LAPAROSCOPIC APPENDECTOMY  01/18/2012   Procedure: APPENDECTOMY LAPAROSCOPIC;  Surgeon: Currie Parishristian J Streck, MD;  Location: [facility];  Service: General;  Laterality: N/A;    OB History    Gravida Para Term Preterm AB Living   5 4 4     4    SAB TAB Ectopic Multiple Live Births                   Home Medications    Prior to Admission medications   Medication Sig Start Date End Date Taking? Authorizing Provider  escitalopram (LEXAPRO) 10 MG tablet Take 1 tablet (10 mg total) by mouth daily. Patient not taking: Reported on 04/06/2017 11/23/16   Pete GlatterLangeland, Dawn T, MD  HYDROcodone-acetaminophen (NORCO/VICODIN) 5-325 MG tablet Take 2 tablets by mouth every 6 (six) hours as needed. Patient not taking: Reported on 04/27/2017 04/10/17   Roxy HorsemanBrowning, Robert, PA-C  levothyroxine (SYNTHROID, LEVOTHROID) 25 MCG tablet Take 1 tablet (25 mcg total) by  mouth daily. 05/10/17   Marcine MatarJohnson, Deborah B, MD  Vitamin D, Ergocalciferol, (DRISDOL) 50000 units CAPS capsule Take 1 capsule (50,000 Units total) by mouth every 7 (seven) days. Patient not taking: Reported on 12/04/2016 04/21/16   Pete GlatterLangeland, Dawn T, MD    Family History Family History  Problem Relation Age of Onset  . Diabetes Mother   . Breast cancer Neg Hx     Social History Social History   Tobacco Use  . Smoking status: Never Smoker  . Smokeless tobacco: Never Used  Substance Use Topics  . Alcohol use: No  . Drug use: No     Allergies   Patient has no known allergies.   Review of Systems Review of Systems   Physical Exam Triage Vital Signs ED Triage Vitals [07/06/17 1010]  Enc Vitals Group     BP 111/66     Pulse Rate 66     Resp 18     Temp 97.6 F (36.4 C)     Temp Source Oral     SpO2 100 %     Weight      Height      Head Circumference      Peak Flow      Pain Score 0     Pain Loc      Pain Edu?  Excl. in GC?    No data found.  Updated Vital Signs BP 111/66 (BP Location: Right Arm)   Pulse 66   Temp 97.6 F (36.4 C) (Oral)   Resp 18   LMP 06/14/2017   SpO2 100%   Visual Acuity Right Eye Distance:   Left Eye Distance:   Bilateral Distance:    Right Eye Near:   Left Eye Near:    Bilateral Near:     Physical Exam  Constitutional: She is oriented to person, place, and time. She appears well-developed and well-nourished. No distress.  HENT:  Head: Normocephalic and atraumatic.  Right Ear: Hearing, tympanic membrane, external ear and ear canal normal.  Left Ear: Hearing, tympanic membrane, external ear and ear canal normal.  Mouth/Throat: Oropharynx is clear and moist.  Canal and TM of bilateral ears completely clear, without foreign body or cerumen  Cardiovascular: Normal rate, regular rhythm and normal heart sounds.  Pulmonary/Chest: Effort normal and breath sounds normal.  Neurological: She is alert and oriented to person, place,  and time.  Skin: Skin is warm and dry.     UC Treatments / Results  Labs (all labs ordered are listed, but only abnormal results are displayed) Labs Reviewed - No data to display  EKG  EKG Interpretation None       Radiology No results found.  Procedures Procedures (including critical care time)  Medications Ordered in UC Medications - No data to display   Initial Impression / Assessment and Plan / UC Course  I have reviewed the triage vital signs and the nursing notes.  Pertinent labs & imaging results that were available during my care of the patient were reviewed by me and considered in my medical decision making (see chart for details).     Discussed possible causes of foreign body sensation with patient, she declines any further treatment options at this time for ear discomfort. She states she feels reassured knowing there is nothing in her ear. If symptoms worsen or do not improve in the next week to return to be seen or to follow up with PCP. Patient verbalized understanding and agreeable to plan.    Final Clinical Impressions(s) / UC Diagnoses   Final diagnoses:  Right ear pain    ED Discharge Orders    None       Controlled Substance Prescriptions [place] Controlled Substance Registry consulted? Not Applicable   Georgetta HaberBurky, Chaston Bradburn B, NP 07/06/17 1022

## 2017-07-06 NOTE — ED Triage Notes (Signed)
Pt here with ear ring back in right ear x 2 days

## 2017-08-01 ENCOUNTER — Telehealth: Payer: Self-pay | Admitting: Internal Medicine

## 2017-08-01 MED ORDER — LEVOTHYROXINE SODIUM 25 MCG PO TABS: 25.0000 ug | ORAL_TABLET | Freq: Every day | ORAL | 0 refills | Status: DC

## 2017-08-01 MED FILL — LEVOTHYROXINE 25 MCG TABLET: 25 | 30 days supply | Qty: 30 | Fill #0

## 2017-08-01 NOTE — Telephone Encounter (Signed)
Pt called to request a refill for levothyroxine (SYNTHROID, LEVOTHROID) 25 MCG tablet  [facility] [address] [facility] [address] Please follow up

## 2017-08-01 NOTE — Telephone Encounter (Signed)
Refilled, due for office visit with PCP

## 2017-08-31 ENCOUNTER — Other Ambulatory Visit: Payer: Self-pay | Admitting: Internal Medicine

## 2017-08-31 MED ORDER — LEVOTHYROXINE SODIUM 25 MCG PO TABS: 25.0000 ug | ORAL_TABLET | Freq: Every day | ORAL | 0 refills | Status: DC

## 2017-08-31 NOTE — Telephone Encounter (Signed)
Refilled

## 2017-08-31 NOTE — Addendum Note (Signed)
Addended by: Santa LighterHAMMER, STACEY K on: 08/31/2017 08:52 AM   Modules accepted: Orders

## 2017-08-31 NOTE — Telephone Encounter (Signed)
Pt called to request a refill on  -levothyroxine (SYNTHROID, LEVOTHROID) 25 MCG tablet  Until next office visit [facility] [address] Please follow up

## 2017-09-20 ENCOUNTER — Ambulatory Visit: Payer: Self-pay | Admitting: Internal Medicine

## 2017-11-17 ENCOUNTER — Encounter: Payer: Self-pay | Admitting: Internal Medicine

## 2017-11-17 ENCOUNTER — Ambulatory Visit: Payer: Self-pay | Attending: Internal Medicine | Admitting: Internal Medicine

## 2017-11-17 VITALS — BP 103/67 | HR 65 | Temp 98.8°F | Resp 16 | Wt 140.0 lb

## 2017-11-17 DIAGNOSIS — Z8619 Personal history of other infectious and parasitic diseases: Secondary | ICD-10-CM

## 2017-11-17 DIAGNOSIS — Z79899 Other long term (current) drug therapy: Secondary | ICD-10-CM | POA: Insufficient documentation

## 2017-11-17 DIAGNOSIS — F329 Major depressive disorder, single episode, unspecified: Secondary | ICD-10-CM | POA: Insufficient documentation

## 2017-11-17 DIAGNOSIS — N938 Other specified abnormal uterine and vaginal bleeding: Secondary | ICD-10-CM

## 2017-11-17 DIAGNOSIS — E039 Hypothyroidism, unspecified: Secondary | ICD-10-CM

## 2017-11-17 MED ORDER — LEVOTHYROXINE SODIUM 25 MCG PO TABS: 25.0000 ug | ORAL_TABLET | Freq: Every day | ORAL | 5 refills | Status: DC

## 2017-11-17 MED FILL — LEVOTHYROXINE 25 MCG TABLET: 25 | 30 days supply | Qty: 30 | Fill #0

## 2017-11-17 NOTE — Progress Notes (Signed)
Patient ID: Kristen Holt, female    DOB: 05-23-1976  MRN: [id number]  CC: Medication Management   Subjective: Kristen Holt is a 42 y.o. female who presents for  Chronic ds management.  Last seen 12/2016 Her concerns today include:  Pt with hx of hypothyroid, depression.  1.  Thyroid:  Out of Levothyroxine for about 1 mth. Needs RF  2.   Had Nexplanon placed at [facility] in 06/2017.  Since then she has had vaginal bleeding consistently for past 5 mths.  Some days heavy bleeding, other days, light.  + Cramps with the bleeding. Started on BCP Norgestimate and Ethinyl Estradiol 3 wks ago. Bleeding stopped 3 wks ago.  She was told to f/u here for further eval -tested positive for GC 1.5 mths ago. Active with one female partner only.  Partner tested negative. Treated at [facility] -no dischg at this time Last pap 10/2016, it was negative  Patient Active Problem List   Diagnosis Date Noted  . Depression 01/01/2017  . Hypothyroid 01/01/2017  . Pap smear for cervical cancer screening 08/21/2013  . Allergic rhinitis 02/01/2013  . Acute appendicitis 01/13/2012     Current Outpatient Medications on File Prior to Visit  Medication Sig Dispense Refill  . escitalopram (LEXAPRO) 10 MG tablet Take 1 tablet (10 mg total) by mouth daily. (Patient not taking: Reported on 04/06/2017) 30 tablet 1  . HYDROcodone-acetaminophen (NORCO/VICODIN) 5-325 MG tablet Take 2 tablets by mouth every 6 (six) hours as needed. (Patient not taking: Reported on 04/27/2017) 10 tablet 0  . Vitamin D, Ergocalciferol, (DRISDOL) 50000 units CAPS capsule Take 1 capsule (50,000 Units total) by mouth every 7 (seven) days. (Patient not taking: Reported on 12/04/2016) 12 capsule 0  . [DISCONTINUED] Ranitidine HCl (ZANTAC PO) Take by mouth.     No current facility-administered medications on file prior to visit.     No Known Allergies  Social History   Socioeconomic History  . Marital status: Single    Spouse name: Not on  file  . Number of children: Not on file  . Years of education: Not on file  . Highest education level: Not on file  Occupational History  . Not on file  Social Needs  . Financial resource strain: Not on file  . Food insecurity:    Worry: Not on file    Inability: Not on file  . Transportation needs:    Medical: Not on file    Non-medical: Not on file  Tobacco Use  . Smoking status: Never Smoker  . Smokeless tobacco: Never Used  Substance and Sexual Activity  . Alcohol use: No  . Drug use: No  . Sexual activity: Yes    Partners: Male  Lifestyle  . Physical activity:    Days per week: Not on file    Minutes per session: Not on file  . Stress: Not on file  Relationships  . Social connections:    Talks on phone: Not on file    Gets together: Not on file    Attends religious service: Not on file    Active member of club or organization: Not on file    Attends meetings of clubs or organizations: Not on file    Relationship status: Not on file  . Intimate partner violence:    Fear of current or ex partner: Not on file    Emotionally abused: Not on file    Physically abused: Not on file    Forced sexual activity: Not  on file  Other Topics Concern  . Not on file  Social History Narrative   ** Merged History Encounter **        Family History  Problem Relation Age of Onset  . Diabetes Mother   . Breast cancer Neg Hx     Past Surgical History:  Procedure Laterality Date  . APPENDECTOMY    . LAPAROSCOPIC APPENDECTOMY  01/18/2012   Procedure: APPENDECTOMY LAPAROSCOPIC;  Surgeon: Currie Parishristian J Streck, MD;  Location: [facility];  Service: General;  Laterality: N/A;    ROS: Review of Systems Negative except as stated above PHYSICAL EXAM: BP 103/67   Pulse 65   Temp 98.8 F (37.1 C) (Oral)   Resp 16   Wt 140 lb (63.5 kg)   LMP 02/28/2017   SpO2 98%   BMI 24.03 kg/m   Wt Readings from Last 3 Encounters:  11/17/17 140 lb (63.5 kg)  04/27/17 147 lb 6.4 oz (66.9 kg)    04/12/17 147 lb 8 oz (66.9 kg)    Physical Exam  General appearance - alert, well appearing, and in no distress Mental status - alert, oriented to person, place, and time, normal mood, behavior, speech, dress, motor activity, and thought processes Chest - clear to auscultation, no wheezes, rales or rhonchi, symmetric air entry Heart - normal rate, regular rhythm, normal S1, S2, no murmurs, rubs, clicks or gallops Abdomen - soft, nontender, nondistended, no masses or organomegaly Pelvic -Kristen Holt, CMA present:  normal external genitalia, vulva, vagina, cervix, and adnexa.  Uterus slightly enlarged    ASSESSMENT AND PLAN: 1. Hypothyroidism, unspecified type - levothyroxine (SYNTHROID, LEVOTHROID) 25 MCG tablet; Take 1 tablet (25 mcg total) by mouth daily.  Dispense: 30 tablet; Refill: 5 - TSH - Comprehensive metabolic panel  2. DUB (dysfunctional uterine bleeding) Most likely due to Nexplanon.  Continue current pack of BCP. Hopefully she would have no further bleeding once she is done with this.   We also need to check a thyroid level as uncontrolled thyroid disease can also play a role.   - CBC - Cervicovaginal ancillary only - US Transvaginal Non-OB; Future - US Pelvis Complete; Future  3. Hx of gonorrhea - Cervicovaginal ancillary only  Patient was given the opportunity to ask questions.  Patient verbalized understanding of the plan and was able to repeat key elements of the plan.   Orders Placed This Encounter  Procedures  . US Transvaginal Non-OB  . US Pelvis Complete  . TSH  . CBC  . Comprehensive metabolic panel     Requested Prescriptions   Signed Prescriptions Disp Refills  . levothyroxine (SYNTHROID, LEVOTHROID) 25 MCG tablet 30 tablet 5    Sig: Take 1 tablet (25 mcg total) by mouth daily.    Return in about 6 weeks (around 12/29/2017).  Jonah Blueeborah Tlaloc Taddei, MD, FACP

## 2017-11-18 LAB — CBC
HEMOGLOBIN: 12.2 g/dL (ref 11.1–15.9)
Hematocrit: 38.1 % (ref 34.0–46.6)
MCH: 29 pg (ref 26.6–33.0)
MCHC: 32 g/dL (ref 31.5–35.7)
MCV: 91 fL (ref 79–97)
Platelets: 337 10*3/uL (ref 150–379)
RBC: 4.2 x10E6/uL (ref 3.77–5.28)
RDW: 14.4 % (ref 12.3–15.4)
WBC: 6.9 10*3/uL (ref 3.4–10.8)

## 2017-11-18 LAB — COMPREHENSIVE METABOLIC PANEL
ALBUMIN: 3.8 g/dL (ref 3.5–5.5)
ALK PHOS: 47 IU/L (ref 39–117)
ALT: 23 IU/L (ref 0–32)
AST: 22 IU/L (ref 0–40)
Albumin/Globulin Ratio: 1.4 (ref 1.2–2.2)
BUN / CREAT RATIO: 25 — AB (ref 9–23)
BUN: 14 mg/dL (ref 6–24)
Bilirubin Total: <0.2 mg/dL (ref 0.0–1.2)
CALCIUM: 8.7 mg/dL (ref 8.7–10.2)
CO2: 20 mmol/L (ref 20–29)
CREATININE: 0.57 mg/dL (ref 0.57–1.00)
Chloride: 110 mmol/L — ABNORMAL HIGH (ref 96–106)
GFR calc non Af Amer: 116 mL/min/{1.73_m2} (ref >59)
GFR, EST AFRICAN AMERICAN: 133 mL/min/{1.73_m2} (ref >59)
GLUCOSE: 102 mg/dL — AB (ref 65–99)
Globulin, Total: 2.7 g/dL (ref 1.5–4.5)
Potassium: 4.1 mmol/L (ref 3.5–5.2)
Sodium: 141 mmol/L (ref 134–144)
Total Protein: 6.5 g/dL (ref 6.0–8.5)

## 2017-11-18 LAB — CERVICOVAGINAL ANCILLARY ONLY
CHLAMYDIA, DNA PROBE: NEGATIVE
Neisseria Gonorrhea: NEGATIVE

## 2017-11-18 LAB — TSH: TSH: 1.48 u[IU]/mL (ref 0.450–4.500)

## 2017-11-21 ENCOUNTER — Telehealth: Payer: Self-pay

## 2017-11-21 NOTE — Telephone Encounter (Signed)
-----   Message from Particia LatherJay'A R Pollock, [place] sent at 11/21/2017  4:20 PM EDT -----   ----- Message ----- From: Marcine MatarJohnson, Deborah B, MD Sent: 11/20/2017   7:05 AM To: Particia LatherJay'A R Pollock, RMA  Let pt know that vaginal secretions negative for GC/chlamydia.

## 2017-11-21 NOTE — Telephone Encounter (Signed)
-----   Message from Particia LatherJay'A R Pollock, [place] sent at 11/21/2017  4:20 PM EDT -----   ----- Message ----- From: Marcine MatarJohnson, Deborah B, MD Sent: 11/18/2017  11:09 AM To: Particia LatherJay'A R Pollock, RMA  Let pt know that her thyroid level, kidney and LFT and blood count are normal.

## 2017-11-21 NOTE — Telephone Encounter (Signed)
CMA call patient regarding alb results   Patient did not answer but left a Vm stating to call back regarding lab results

## 2017-12-28 ENCOUNTER — Telehealth: Payer: Self-pay

## 2017-12-28 NOTE — Telephone Encounter (Signed)
CMA call letting her know she has refill & that she needs to requested from the pharmacy   Patient did not answer but left a detailed message & if have any questions just to call back

## 2017-12-28 NOTE — Telephone Encounter (Signed)
Pt has refills at pharmacy. Pt levothyroxine was filled 11-17-17 with 5 refills on it

## 2017-12-29 ENCOUNTER — Ambulatory Visit: Payer: Self-pay | Attending: Internal Medicine | Admitting: Internal Medicine

## 2017-12-29 ENCOUNTER — Encounter: Payer: Self-pay | Admitting: Internal Medicine

## 2017-12-29 VITALS — BP 98/63 | HR 66 | Temp 98.2°F | Resp 16 | Wt 138.4 lb

## 2017-12-29 DIAGNOSIS — N938 Other specified abnormal uterine and vaginal bleeding: Secondary | ICD-10-CM | POA: Insufficient documentation

## 2017-12-29 DIAGNOSIS — F329 Major depressive disorder, single episode, unspecified: Secondary | ICD-10-CM | POA: Insufficient documentation

## 2017-12-29 DIAGNOSIS — E039 Hypothyroidism, unspecified: Secondary | ICD-10-CM | POA: Insufficient documentation

## 2017-12-29 NOTE — Progress Notes (Signed)
Patient ID: Kristen Holt, female    DOB: 04-10-1976  MRN: [id number]  CC: Follow-up   Subjective: Kristen Holt is a 42 y.o. female who presents for one-month follow-up on dysfunctional uterine bleeding Her concerns today include:   DUB:  On last visit patient complained of prolonged vaginal bleeding since having Nexplanon placed in November 2018 [facility].  She was placed on combined oral birth control pills by them.  The bleeding stopped during the time she was taking the pills.  However when she completed the active pills she had some withdrawal bleeding which she states was heavy with a lot of clots.  She spoke with a provider at the health department who then prescribed another month of birth control pills for her.  Patient states that after she completes this pack if bleeding occurs again and is prolonged she would like to have the Nexplanon removed.  She never did get the pelvic ultrasound scheduled as ordered on last visit.    Thyroid level was normal on last visit despite her having been off Levothyroxine for 1 mth at that point.  Today she reports being out of the medicine x5 days. Patient Active Problem List   Diagnosis Date Noted  . Depression 01/01/2017  . Hypothyroid 01/01/2017  . Pap smear for cervical cancer screening 08/21/2013  . Allergic rhinitis 02/01/2013  . Acute appendicitis 01/13/2012     Current Outpatient Medications on File Prior to Visit  Medication Sig Dispense Refill  . escitalopram (LEXAPRO) 10 MG tablet Take 1 tablet (10 mg total) by mouth daily. (Patient not taking: Reported on 04/06/2017) 30 tablet 1  . Vitamin D, Ergocalciferol, (DRISDOL) 50000 units CAPS capsule Take 1 capsule (50,000 Units total) by mouth every 7 (seven) days. (Patient not taking: Reported on 12/04/2016) 12 capsule 0  . [DISCONTINUED] Ranitidine HCl (ZANTAC PO) Take by mouth.     No current facility-administered medications on file prior to  visit.     No Known Allergies  Social History   Socioeconomic History  . Marital status: Single    Spouse name: Not on file  . Number of children: Not on file  . Years of education: Not on file  . Highest education level: Not on file  Occupational History  . Not on file  Social Needs  . Financial resource strain: Not on file  . Food insecurity:    Worry: Not on file    Inability: Not on file  . Transportation needs:    Medical: Not on file    Non-medical: Not on file  Tobacco Use  . Smoking status: Never Smoker  . Smokeless tobacco: Never Used  Substance and Sexual Activity  . Alcohol use: No  . Drug use: No  . Sexual activity: Yes    Partners: Male  Lifestyle  . Physical activity:    Days per week: Not on file    Minutes per session: Not on file  . Stress: Not on file  Relationships  . Social connections:    Talks on phone: Not on file    Gets together: Not on file    Attends religious service: Not on file    Active member of club or organization: Not on file    Attends meetings of clubs or organizations: Not on file    Relationship status: Not on file  . Intimate partner violence:    Fear of current or ex partner: Not on file    Emotionally abused: Not  on file    Physically abused: Not on file    Forced sexual activity: Not on file  Other Topics Concern  . Not on file  Social History Narrative   ** Merged History Encounter **        Family History  Problem Relation Age of Onset  . Diabetes Mother   . Breast cancer Neg Hx     Past Surgical History:  Procedure Laterality Date  . APPENDECTOMY    . LAPAROSCOPIC APPENDECTOMY  01/18/2012   Procedure: APPENDECTOMY LAPAROSCOPIC;  Surgeon: Currie Paris, MD;  Location: [facility];  Service: General;  Laterality: N/A;    ROS: Review of Systems Negative except as stated above PHYSICAL EXAM: BP 98/63   Pulse 66   Temp 98.2 F (36.8 C) (Oral)   Resp 16   Wt 138 lb 6.4 oz (62.8 kg)   LMP 02/28/2017    SpO2 98%   BMI 23.76 kg/m   Physical Exam  General appearance - alert, well appearing, and in no distress Mental status - normal mood, behavior, speech, dress, motor activity, and thought processes  ASSESSMENT AND PLAN: 1. Hypothyroidism, unspecified type Since thyroid level was normal when she was off of levothyroxine for 1 month, I will hold off on having her restart it.  She will return to the lab in 1 month to have level rechecked to see whether she still needs to be on the medicine or not - TSH; Future  2. DUB (dysfunctional uterine bleeding) Likely due to the effect of Nexplanon.  I agree with her that if after completion of this current pack of birth control pills she has recurrent bleeding that is prolonged which is very distressing for her, best choice would probably to be to remove the implant.  Pelvic ultrasound has been scheduled  Patient was given the opportunity to ask questions.  Patient verbalized understanding of the plan and was able to repeat key elements of the plan.   Orders Placed This Encounter  Procedures  . TSH     Requested Prescriptions    No prescriptions requested or ordered in this encounter    No follow-ups on file.  Jonah Blue, MD, FACP

## 2018-01-04 ENCOUNTER — Ambulatory Visit (HOSPITAL_COMMUNITY)
Admission: RE | Admit: 2018-01-04 | Discharge: 2018-01-04 | Disposition: A | Discharge status: Home / Self Care | Payer: Self-pay | Source: Ambulatory Visit | Attending: Internal Medicine | Admitting: Internal Medicine

## 2018-01-04 DIAGNOSIS — N938 Other specified abnormal uterine and vaginal bleeding: Secondary | ICD-10-CM | POA: Insufficient documentation

## 2018-01-12 ENCOUNTER — Telehealth: Payer: Self-pay

## 2018-01-12 NOTE — Telephone Encounter (Signed)
[employer] Kristen Holt Id# [id number] Contacted pt to go over US results spoke with pt daughter Kristen Holt and gave results.

## 2018-08-11 ENCOUNTER — Ambulatory Visit (HOSPITAL_COMMUNITY)
Admission: EM | Admit: 2018-08-11 | Discharge: 2018-08-11 | Disposition: A | Discharge status: Home / Self Care | Payer: Self-pay | Attending: Family Medicine | Admitting: Family Medicine

## 2018-08-11 ENCOUNTER — Encounter (HOSPITAL_COMMUNITY): Payer: Self-pay

## 2018-08-11 DIAGNOSIS — B356 Tinea cruris: Secondary | ICD-10-CM | POA: Insufficient documentation

## 2018-08-11 MED ORDER — KETOCONAZOLE 2 % EX CREA: 1.0000 "application " | TOPICAL_CREAM | Freq: Two times a day (BID) | CUTANEOUS | 0 refills | Status: DC

## 2018-08-11 MED ORDER — FLUCONAZOLE 150 MG PO TABS: 150.0000 mg | ORAL_TABLET | Freq: Once | ORAL | 0 refills | Status: AC

## 2018-08-11 NOTE — ED Provider Notes (Signed)
[facility]    CSN: [id number] Arrival date & time: 08/11/18  [id number]     History   Chief Complaint Chief Complaint  Patient presents with  . vaginal and rectal irritation    HPI Kristen Holt is a 43 y.o. female.   Pt c/o vaginal and rectal irritation x 2 wks. States worse after having a bowel movement. States feel tender and raw to touch. Used OTC creams with no relief   Patient denies any history of diabetes.  She has a bowel movement, the gluteal cleft burns for quite some time and then subsequently itches.     Past Medical History:  Diagnosis Date  . Acute appendicitis 01/13/2012  . Thyroid disease     Patient Active Problem List   Diagnosis Date Noted  . Depression 01/01/2017  . Hypothyroid 01/01/2017  . Pap smear for cervical cancer screening 08/21/2013  . Allergic rhinitis 02/01/2013  . Acute appendicitis 01/13/2012    Past Surgical History:  Procedure Laterality Date  . APPENDECTOMY    . LAPAROSCOPIC APPENDECTOMY  01/18/2012   Procedure: APPENDECTOMY LAPAROSCOPIC;  Surgeon: Currie Parishristian J Streck, MD;  Location: [facility];  Service: General;  Laterality: N/A;    OB History    Gravida  5   Para  4   Term  4   Preterm      AB      Living  4     SAB      TAB      Ectopic      Multiple      Live Births               Home Medications    Prior to Admission medications   Medication Sig Start Date End Date Taking? Authorizing Provider  fluconazole (DIFLUCAN) 150 MG tablet Take 1 tablet (150 mg total) by mouth once for 1 dose. Repeat if needed 08/11/18 08/11/18  Elvina SidleLauenstein, Alis Sawchuk, MD  ketoconazole (NIZORAL) 2 % cream Apply 1 application topically 2 (two) times daily. 08/11/18   Elvina SidleLauenstein, Treniya Lobb, MD    Family History Family History  Problem Relation Age of Onset  . Diabetes Mother   . Breast cancer Neg Hx     Social History Social History   Tobacco Use  . Smoking status: Never Smoker  . Smokeless tobacco: Never Used   Substance Use Topics  . Alcohol use: No  . Drug use: No     Allergies   Patient has no known allergies.   Review of Systems Review of Systems   Physical Exam Triage Vital Signs ED Triage Vitals  Enc Vitals Group     BP 08/11/18 0951 (!) 110/57     Pulse Rate 08/11/18 0951 65     Resp 08/11/18 0951 14     Temp 08/11/18 0951 97.8 F (36.6 C)     Temp Source 08/11/18 0951 Oral     SpO2 08/11/18 0951 100 %     Weight --      Height --      Head Circumference --      Peak Flow --      Pain Score 08/11/18 0955 0     Pain Loc --      Pain Edu? --      Excl. in GC? --    No data found.  Updated Vital Signs BP (!) 110/57 (BP Location: Left Arm)   Pulse 65   Temp 97.8 F (36.6 C) (Oral)  Resp 14   LMP 07/18/2018   SpO2 100%   Physical Exam Vitals signs and nursing note reviewed.  Constitutional:      Appearance: Normal appearance.  HENT:     Head: Normocephalic.     Right Ear: External ear normal.     Left Ear: External ear normal.     Nose: Nose normal.     Mouth/Throat:     Mouth: Mucous membranes are moist.  Eyes:     Conjunctiva/sclera: Conjunctivae normal.  Neck:     Musculoskeletal: Normal range of motion and neck supple.  Pulmonary:     Effort: Pulmonary effort is normal.  Musculoskeletal: Normal range of motion.  Skin:    Comments: Confluent rash in the gluteal cleft and perineum with some fissuring and serous drainage  Neurological:     General: No focal deficit present.     Mental Status: She is alert and oriented to person, place, and time.      UC Treatments / Results  Labs (all labs ordered are listed, but only abnormal results are displayed) Labs Reviewed - No data to display  EKG None  Radiology No results found.  Procedures Procedures (including critical care time)  Medications Ordered in UC Medications - No data to display  Initial Impression / Assessment and Plan / UC Course  I have reviewed the triage vital signs  and the nursing notes.  Pertinent labs & imaging results that were available during my care of the patient were reviewed by me and considered in my medical decision making (see chart for details).    Final Clinical Impressions(s) / UC Diagnoses   Final diagnoses:  Tinea cruris     Discharge Instructions     Regresa si la problema continua mas de 3 dias    ED Prescriptions    Medication Sig Dispense Auth. Provider   fluconazole (DIFLUCAN) 150 MG tablet Take 1 tablet (150 mg total) by mouth once for 1 dose. Repeat if needed 2 tablet Elvina SidleLauenstein, Kristianne Albin, MD   ketoconazole (NIZORAL) 2 % cream Apply 1 application topically 2 (two) times daily. 60 g Elvina SidleLauenstein, Burma Ketcher, MD     Controlled Substance Prescriptions [place] Controlled Substance Registry consulted? No   Elvina SidleLauenstein, Ulrick Methot, MD 08/11/18 1028

## 2018-08-11 NOTE — Discharge Instructions (Addendum)
Regresa si la problema continua mas de 3 dias

## 2018-08-11 NOTE — ED Triage Notes (Signed)
Pt c/o vaginal and rectal irritation x2wks. States worse after having a bowel movement. States feel tender and raw to touch. Used OTC creams with no relief

## 2018-08-22 ENCOUNTER — Ambulatory Visit (HOSPITAL_COMMUNITY)
Admission: EM | Admit: 2018-08-22 | Discharge: 2018-08-22 | Disposition: A | Discharge status: Home / Self Care | Payer: Self-pay | Attending: Family Medicine | Admitting: Family Medicine

## 2018-08-22 ENCOUNTER — Encounter (HOSPITAL_COMMUNITY): Payer: Self-pay

## 2018-08-22 DIAGNOSIS — K6289 Other specified diseases of anus and rectum: Secondary | ICD-10-CM

## 2018-08-22 MED ORDER — HYDROCORTISONE 2.5 % RE CREA: TOPICAL_CREAM | RECTAL | 0 refills | Status: DC

## 2018-08-22 MED FILL — PROCTOZONE-HC 2.5 % CREA: 2.5 | 10 days supply | Qty: 30 | Fill #0

## 2018-08-22 NOTE — ED Triage Notes (Signed)
Pt states here on Friday for same with no relief. C/o rectal irritation after having a BM and can feel something there. Pt also c/o pimples on the sides of vagina, panty line.

## 2018-08-22 NOTE — Discharge Instructions (Signed)
Apply cream to rectal area 2 -3  Times a day Avoid constipation Drink plenty of water Use moist wipes in place of paper when irritated

## 2018-08-22 NOTE — ED Provider Notes (Signed)
[facility]    CSN: [id number] Arrival date & time: 08/22/18  [id number]     History   Chief Complaint Chief Complaint  Patient presents with  . rectal irritation    HPI Kristen Holt is a 43 y.o. female.   HPI  Patient was seen on 08/11/2022 vaginal and rectal irritation.  She was seen by Dr. Milus GlazierLauenstein.  She was diagnosed with a fungal rash given Diflucan and Nizoral.  She states that it is improving, but she still has rectal irritation.  She has some burning and itching.  Worse with bowel movement.  No discharge.  No bleeding.  She also has a couple of "pimples" in her panty line.  No vaginal discharge or irritation.  Past Medical History:  Diagnosis Date  . Acute appendicitis 01/13/2012  . Thyroid disease     Patient Active Problem List   Diagnosis Date Noted  . Depression 01/01/2017  . Hypothyroid 01/01/2017  . Pap smear for cervical cancer screening 08/21/2013  . Allergic rhinitis 02/01/2013  . Acute appendicitis 01/13/2012    Past Surgical History:  Procedure Laterality Date  . APPENDECTOMY    . LAPAROSCOPIC APPENDECTOMY  01/18/2012   Procedure: APPENDECTOMY LAPAROSCOPIC;  Surgeon: Currie Parishristian J Streck, MD;  Location: [facility];  Service: General;  Laterality: N/A;    OB History    Gravida  5   Para  4   Term  4   Preterm      AB      Living  4     SAB      TAB      Ectopic      Multiple      Live Births               Home Medications    Prior to Admission medications   Medication Sig Start Date End Date Taking? Authorizing Provider  hydrocortisone (ANUSOL-HC) 2.5 % rectal cream Apply to rectal area 2 -3  Times a day 08/22/18   Eustace MooreNelson, Negar Sieler Sue, MD  ketoconazole (NIZORAL) 2 % cream Apply 1 application topically 2 (two) times daily. 08/11/18   Elvina SidleLauenstein, Kurt, MD    Family History Family History  Problem Relation Age of Onset  . Diabetes Mother   . Breast cancer Neg Hx     Social History Social History   Tobacco  Use  . Smoking status: Never Smoker  . Smokeless tobacco: Never Used  Substance Use Topics  . Alcohol use: No  . Drug use: No     Allergies   Patient has no known allergies.   Review of Systems Review of Systems  Constitutional: Negative for chills and fever.  HENT: Negative for ear pain and sore throat.   Eyes: Negative for pain and visual disturbance.  Respiratory: Negative for cough and shortness of breath.   Cardiovascular: Negative for chest pain and palpitations.  Gastrointestinal: Negative for abdominal pain and vomiting.       Rectal itching and irritation  Genitourinary: Negative for dysuria, hematuria, vaginal bleeding and vaginal discharge.  Musculoskeletal: Negative for arthralgias and back pain.  Skin: Negative for color change and rash.  Neurological: Negative for seizures and syncope.  All other systems reviewed and are negative.    Physical Exam Triage Vital Signs ED Triage Vitals  Enc Vitals Group     BP 08/22/18 0927 103/61     Pulse Rate 08/22/18 0927 69     Resp 08/22/18 0927 16  Temp 08/22/18 0927 97.6 F (36.4 C)     Temp Source 08/22/18 0927 Oral     SpO2 08/22/18 0927 100 %     Weight --      Height --      Head Circumference --      Peak Flow --      Pain Score 08/22/18 0931 4     Pain Loc --      Pain Edu? --      Excl. in GC? --    No data found.  Updated Vital Signs BP 103/61 (BP Location: Left Arm)   Pulse 69   Temp 97.6 F (36.4 C) (Oral)   Resp 16   LMP 08/12/2018   SpO2 100%   Visual Acuity Right Eye Distance:   Left Eye Distance:   Bilateral Distance:    Right Eye Near:   Left Eye Near:    Bilateral Near:     Physical Exam Constitutional:      General: She is not in acute distress.    Appearance: She is well-developed.  HENT:     Head: Normocephalic and atraumatic.  Eyes:     Conjunctiva/sclera: Conjunctivae normal.     Pupils: Pupils are equal, round, and reactive to light.  Neck:     Musculoskeletal:  Normal range of motion.  Cardiovascular:     Rate and Rhythm: Normal rate.  Pulmonary:     Effort: Pulmonary effort is normal. No respiratory distress.  Abdominal:     General: There is no distension.     Palpations: Abdomen is soft.  Genitourinary:    General: Normal vulva.     Rectum: Normal.     Comments: No visible rash.  Rectal exam normal.  No friability or inflamed tissue noted.  The vaginal bumps are indeed small infected follicles. Musculoskeletal: Normal range of motion.  Skin:    General: Skin is warm and dry.  Neurological:     Mental Status: She is alert.      UC Treatments / Results  Labs (all labs ordered are listed, but only abnormal results are displayed) Labs Reviewed - No data to display  EKG None  Radiology No results found.  Procedures Procedures (including critical care time)  Medications Ordered in UC Medications - No data to display  Initial Impression / Assessment and Plan / UC Course  I have reviewed the triage vital signs and the nursing notes.  Pertinent labs & imaging results that were available during my care of the patient were reviewed by me and considered in my medical decision making (see chart for details).     I explained that the bumps in her outer vaginal area were from shaving and were just a minor folliculitis.  This will resolve by itself.  I do not see any irritation of the rectal skin and rectal examination is normal.  We will treat with a cortisone cream for the itching and discomfort.  Discussed follow-up with her PCP Final Clinical Impressions(s) / UC Diagnoses   Final diagnoses:  Proctitis     Discharge Instructions     Apply cream to rectal area 2 -3  Times a day Avoid constipation Drink plenty of water Use moist wipes in place of paper when irritated   ED Prescriptions    Medication Sig Dispense Auth. Provider   hydrocortisone (ANUSOL-HC) 2.5 % rectal cream Apply to rectal area 2 -3  Times a day 28.35 g  Eustace MooreNelson, Nabilah Davoli Sue, MD  Controlled Substance Prescriptions [place] Controlled Substance Registry consulted? Not Applicable   Eustace Moore, MD 08/22/18 1058

## 2018-08-31 ENCOUNTER — Ambulatory Visit: Payer: Self-pay | Attending: Family Medicine | Admitting: Physician Assistant

## 2018-08-31 VITALS — BP 101/67 | HR 78 | Temp 98.0°F | Wt 143.2 lb

## 2018-08-31 DIAGNOSIS — L290 Pruritus ani: Secondary | ICD-10-CM

## 2018-08-31 DIAGNOSIS — N898 Other specified noninflammatory disorders of vagina: Secondary | ICD-10-CM

## 2018-08-31 DIAGNOSIS — Z09 Encounter for follow-up examination after completed treatment for conditions other than malignant neoplasm: Secondary | ICD-10-CM

## 2018-08-31 MED ORDER — HYDROCORTISONE ACETATE 25 MG RE SUPP: 25.0000 mg | Freq: Two times a day (BID) | RECTAL | 0 refills | Status: DC

## 2018-08-31 MED FILL — HYDROCORTISONE ACETATE 25 M: 25 | 6 days supply | Qty: 12 | Fill #0

## 2018-08-31 NOTE — Progress Notes (Signed)
Patient ID: Kristen Holt, female   DOB: July 03, 1976, 43 y.o.   MRN: [id number]     Kristen Holt, is a 43 y.o. female  JAS:[id number]  ALP:[id number]  DOB - [birth year]  Subjective:  Chief Complaint and HPI: Kristen Holt is a 44 y.o. female here for a follow up visit Seen in ED earlier this month and treated for tinea cruris. She has continued to have rectal itching.  It is NOT worse at night.  No fever.  No vaginal d/c.  desitin helps a little.  No dysuria.  Burns when she wipes after a BM.     ROS:   Constitutional:  No f/c, No night sweats, No unexplained weight loss. EENT:  No vision changes, No blurry vision, No hearing changes. No mouth, throat, or ear problems.  Respiratory: No cough, No SOB Cardiac: No CP, no palpitations GI:  No abd pain, No N/V/D. GU: No Urinary s/sx Musculoskeletal: No joint pain Neuro: No headache, no dizziness, no motor weakness.  Skin: No rash Endocrine:  No polydipsia. No polyuria.  Psych: Denies SI/HI  No problems updated.  ALLERGIES: No Known Allergies  PAST MEDICAL HISTORY: Past Medical History:  Diagnosis Date  . Acute appendicitis 01/13/2012  . Thyroid disease     MEDICATIONS AT HOME: Prior to Admission medications   Medication Sig Start Date End Date Taking? Authorizing Provider  hydrocortisone (ANUSOL-HC) 2.5 % rectal cream Apply to rectal area 2 -3  Times a day 08/22/18  Yes Eustace Moore, MD  ketoconazole (NIZORAL) 2 % cream Apply 1 application topically 2 (two) times daily. 08/11/18  Yes Elvina Sidle, MD  hydrocortisone (ANUSOL-HC) 25 MG suppository Place 1 suppository (25 mg total) rectally 2 (two) times daily. 08/31/18   Anders Simmonds, PA-C     Objective:  EXAM:   Vitals:   08/31/18 1617  BP: 101/67  Pulse: 78  Temp: 98 F (36.7 C)  TempSrc: Oral  SpO2: 100%  Weight: 143 lb 3.2 oz (65 kg)    General appearance : A&OX3. NAD. Non-toxic-appearing HEENT: Atraumatic and  Normocephalic.  PERRLA. EOM intact.  Chest/Lungs:  Breathing-non-labored, Good air entry bilaterally, breath sounds normal without rales, rhonchi, or wheezing  CVS: S1 S2 regular, no murmurs, gallops, rubs  GU: external genitalia w/o lesion.  Speculum inserted.  Vaginal mucosa is normal.  Cervix appears WNL.  Swabs take, small amount of discharge present.  Bimanual unremarkable.   Anal region appears normal without irritation Neurology:  CN II-XII grossly intact, Non focal.   Psych:  TP linear. J/I WNL. Normal speech. Appropriate eye contact and affect.  Skin:  No Rash  Data Review Lab Results  Component Value Date   HGBA1C 5.1 03/03/2017   HGBA1C 5.5 11/23/2016     Assessment & Plan   1. Vaginal itching - Cervicovaginal ancillary only  2. Encounter for examination following treatment at hospital Not better  3. Rectal itching Doubt pinworms.  Skin/mucosa appears normal. Hydrocortisone suppositories bid X 6days   Patient have been counseled extensively about nutrition and exercise  Return if symptoms worsen or fail to improve.  The patient was given clear instructions to go to ER or return to medical center if symptoms don't improve, worsen or new problems develop. The patient verbalized understanding. The patient was told to call to get lab results if they haven't heard anything in the next week.     Georgian Co, PA-C [facility], [place], [place] [phone]   08/31/2018,  4:49 PM

## 2018-09-04 LAB — CERVICOVAGINAL ANCILLARY ONLY
BACTERIAL VAGINITIS: POSITIVE — AB
Candida vaginitis: NEGATIVE
Chlamydia: NEGATIVE
Neisseria Gonorrhea: NEGATIVE
Trichomonas: NEGATIVE

## 2018-09-06 ENCOUNTER — Other Ambulatory Visit: Payer: Self-pay | Admitting: Physician Assistant

## 2018-09-06 MED ORDER — METRONIDAZOLE 500 MG PO TABS: 500.0000 mg | ORAL_TABLET | Freq: Two times a day (BID) | ORAL | 0 refills | Status: DC

## 2018-09-08 ENCOUNTER — Telehealth: Payer: Self-pay

## 2018-09-08 NOTE — Telephone Encounter (Signed)
[employer]  Ramss Id#  [phone] contacted pt to go over lab results pt is aware and doesn't have any questions or concerns

## 2018-10-11 ENCOUNTER — Ambulatory Visit: Payer: Self-pay | Attending: Family Medicine | Admitting: Family Medicine

## 2018-10-11 ENCOUNTER — Encounter: Payer: Self-pay | Admitting: Family Medicine

## 2018-10-11 ENCOUNTER — Other Ambulatory Visit: Payer: Self-pay

## 2018-10-11 VITALS — BP 100/61 | HR 60 | Temp 97.9°F | Wt 145.8 lb

## 2018-10-11 DIAGNOSIS — L290 Pruritus ani: Secondary | ICD-10-CM

## 2018-10-11 MED ORDER — HYDROCORTISONE 1 % EX OINT
1.0000 | TOPICAL_OINTMENT | Freq: Two times a day (BID) | CUTANEOUS | 0 refills | Status: DC
Start: 2018-10-11 — End: 2019-11-06

## 2018-10-11 MED ORDER — DIPHENHYDRAMINE HCL 25 MG PO CAPS: 25.0000 mg | ORAL_CAPSULE | Freq: Every day | ORAL | 0 refills | Status: DC

## 2018-10-11 NOTE — Progress Notes (Signed)
Subjective:  Patient ID: Kristen Holt, female    DOB: 03/30/1976  Age: 43 y.o. MRN: [id number]  CC: Anal Itching   HPI Kristen Holt presents complaining of anal pruritus worse when she wipes her bottom after bowel movements.  This is associated with burning sensation.  Symptoms are absent when she does not move her bowels. Denies hematochezia, constipation, tenesmus. Seen at the ED in 08/2018 and placed on rectal suppositories for management of proctitis however this has been ineffective.  She saw the PA 6 weeks ago at which time she tested positive for bacterial vaginosis which was treated. She denies similar symptoms in other family members.  Past Medical History:  Diagnosis Date  . Acute appendicitis 01/13/2012  . Thyroid disease     Past Surgical History:  Procedure Laterality Date  . APPENDECTOMY    . LAPAROSCOPIC APPENDECTOMY  01/18/2012   Procedure: APPENDECTOMY LAPAROSCOPIC;  Surgeon: Currie Paris, MD;  Location: [facility] OR;  Service: General;  Laterality: N/A;    Family History  Problem Relation Age of Onset  . Diabetes Mother   . Breast cancer Neg Hx     No Known Allergies  Outpatient Medications Prior to Visit  Medication Sig Dispense Refill  . hydrocortisone (ANUSOL-HC) 2.5 % rectal cream Apply to rectal area 2 -3  Times a day (Patient not taking: Reported on 10/11/2018) 28.35 g 0  . hydrocortisone (ANUSOL-HC) 25 MG suppository Place 1 suppository (25 mg total) rectally 2 (two) times daily. (Patient not taking: Reported on 10/11/2018) 12 suppository 0  . ketoconazole (NIZORAL) 2 % cream Apply 1 application topically 2 (two) times daily. (Patient not taking: Reported on 10/11/2018) 60 g 0  . metroNIDAZOLE (FLAGYL) 500 MG tablet Take 1 tablet (500 mg total) by mouth 2 (two) times daily. (Patient not taking: Reported on 10/11/2018) 14 tablet 0   No facility-administered medications prior to visit.      ROS Review of Systems  Constitutional:  Negative for activity change, appetite change and fatigue.  HENT: Negative for congestion, sinus pressure and sore throat.   Eyes: Negative for visual disturbance.  Respiratory: Negative for cough, chest tightness, shortness of breath and wheezing.   Cardiovascular: Negative for chest pain and palpitations.  Gastrointestinal: Negative for abdominal distention, abdominal pain and constipation.  Endocrine: Negative for polydipsia.  Genitourinary: Negative for dysuria and frequency.  Musculoskeletal: Negative for arthralgias and back pain.  Skin: Negative for rash.       See hpi  Neurological: Negative for tremors, light-headedness and numbness.  Hematological: Does not bruise/bleed easily.  Psychiatric/Behavioral: Negative for agitation and behavioral problems.    Objective:  BP 100/61   Pulse 60   Temp 97.9 F (36.6 C) (Oral)   Wt 145 lb 12.8 oz (66.1 kg)   SpO2 99%   BMI 25.03 kg/m   BP/Weight 10/11/2018 08/31/2018 08/22/2018  Systolic BP 100 101 103  Diastolic BP 61 67 61  Wt. (Lbs) 145.8 143.2 -  BMI 25.03 24.58 -      Physical Exam Constitutional:      Appearance: She is well-developed.  Cardiovascular:     Rate and Rhythm: Normal rate.     Heart sounds: Normal heart sounds. No murmur.  Pulmonary:     Effort: Pulmonary effort is normal.     Breath sounds: Normal breath sounds. No wheezing or rales.  Chest:     Chest wall: No tenderness.  Abdominal:     General: Bowel sounds are normal. There is  no distension.     Palpations: Abdomen is soft. There is no mass.     Tenderness: There is no abdominal tenderness.  Genitourinary:    Rectum: Normal. No mass, anal fissure or external hemorrhoid. Normal anal tone.  Musculoskeletal: Normal range of motion.  Neurological:     Mental Status: She is alert and oriented to person, place, and time.     CMP Latest Ref Rng & Units 11/17/2017 04/09/2017 12/03/2016  Glucose 65 - 99 mg/dL 384(T) 364(W) 803(O)  BUN 6 - 24 mg/dL 14 7  10   Creatinine 0.57 - 1.00 mg/dL 1.22 4.82 5.00  Sodium 134 - 144 mmol/L 141 136 134(L)  Potassium 3.5 - 5.2 mmol/L 4.1 3.3(L) 3.4(L)  Chloride 96 - 106 mmol/L 110(H) 107 103  CO2 20 - 29 mmol/L 20 22 22   Calcium 8.7 - 10.2 mg/dL 8.7 3.7(C) 4.8(G)  Total Protein 6.0 - 8.5 g/dL 6.5 6.8 7.0  Total Bilirubin 0.0 - 1.2 mg/dL <8.9 0.5 0.5  Alkaline Phos 39 - 117 IU/L 47 45 68  AST 0 - 40 IU/L 22 24 29   ALT 0 - 32 IU/L 23 22 27     Lipid Panel  No results found for: CHOL, TRIG, HDL, CHOLHDL, VLDL, LDLCALC, LDLDIRECT  CBC    Component Value Date/Time   WBC 6.9 11/17/2017 1524   WBC 9.2 04/12/2017 1219   RBC 4.20 11/17/2017 1524   RBC 4.08 04/12/2017 1219   HGB 12.2 11/17/2017 1524   HCT 38.1 11/17/2017 1524   PLT 337 11/17/2017 1524   MCV 91 11/17/2017 1524   MCH 29.0 11/17/2017 1524   MCH 29.2 04/12/2017 1219   MCHC 32.0 11/17/2017 1524   MCHC 33.6 04/12/2017 1219   RDW 14.4 11/17/2017 1524   LYMPHSABS 2.6 11/23/2016 1639   MONOABS 0.6 09/10/2015 1101   EOSABS 0.4 11/23/2016 1639   BASOSABS 0.1 11/23/2016 1639    Lab Results  Component Value Date   HGBA1C 5.1 03/03/2017    Assessment & Plan:   1. Pruritus ani Used rectal suppositories in the past with no relief Low suspicion for parasitic infection Placed on topical hydrocortisone If symptoms persist she will need an anoscopy - diphenhydrAMINE (BENADRYL) 25 mg capsule; Take 1 capsule (25 mg total) by mouth at bedtime.  Dispense: 30 capsule; Refill: 0   Meds ordered this encounter  Medications  . hydrocortisone 1 % ointment    Sig: Apply 1 application topically 2 (two) times daily.    Dispense:  30 g    Refill:  0  . diphenhydrAMINE (BENADRYL) 25 mg capsule    Sig: Take 1 capsule (25 mg total) by mouth at bedtime.    Dispense:  30 capsule    Refill:  0    Follow-up: Return in about 1 month (around 11/11/2018) for follow up of pruritus ani  with PCP.       Hoy Register, MD, FAAFP. [facility] [place], [place] [phone]   10/11/2018, 6:38 PM

## 2018-10-11 NOTE — Patient Instructions (Signed)
Prurito anal Anal Pruritus El prurito anal ocurre cuando siente picazn en la zona que rodea el ano. Esta sensacin de picazn suele aparecer cuando la zona est hmeda. La humedad puede deberse a sudor o a una pequea cantidad de materia fecal (heces) que queda en la zona. Muchos otros factores tambin pueden causar la picazn. Estos incluyen:  Jabones y [employer].  Papel higinico de color o perfumado.  Lavar la zona excesivamente.  La ingesta de ciertos alimentos.  Tener materia fecal lquida (diarrea).  Determinadas enfermedades de la piel u otras afecciones. La picazn suele desaparecer con el cuidado en el hogar. Rascarse puede daar la piel y Diplomatic Services operational officer. Siga estas indicaciones en su casa: Cuidado de la piel      Mantenga la higiene lavndose bien el cuerpo. ? Limpie la zona afectada con papel higinico hmedo o con un pao hmedo. Hgalo despus de defecar y al acostarse. ? No use jabones en la zona. ? Seque bien la zona. Use una toalla o papel higinico para secar dando golpecitos.  No frote la zona con ningn elemento, ni siquiera con papel higinico.  No se rasque la zona que le pica. Esto puede intensificar la picazn.  Dese un bao de agua tibia (bao de asiento) como se lo haya indicado el mdico. ? Un bao de asiento es un bao con agua tibia que solo le llega hasta la cadera y cubre las nalgas. Un bao de asiento puede [employer] hogar en una baera. Tambin puede hacerse con un dispositivo porttil para bao de asiento que se coloca sobre el inodoro. ? Seque la zona con un pao suave despus de cada bao.  Use cremas o ungentos como se lo haya indicado el mdico.  No tome baos de burbujas, ni use papel higinico perfumado o desodorantes ntimos. Indicaciones generales  Controle si hay algn cambio en sus sntomas.  Use los medicamentos de venta libre y los recetados solamente como se lo haya indicado el mdico.  Evite el uso de  medicamentos que le ayuden a Advertising copywriter (laxantes).  Hable con el mdico sobre el aumento del consumo de [place] en su alimentacin. Esto puede ayudar a [employer] normales si generalmente tiene heces blandas.  Limite o evite los alimentos que pueden causar los sntomas. Estos alimentos pueden ser los siguientes: ? Alimentos picantes, como las salsas, los jalapeos y los condimentos picantes. ? Alimentos que contienen cafena. ? Cerveza. ? Productos lcteos. ? Chocolate. ? Frutos secos. ? Frutas ctricas. ? Tomates.  Use ropa interior de algodn y prendas sueltas.  Concurra a todas las visitas de [address] se lo haya indicado el mdico. Esto es importante. Comunquese con un mdico si:  La picazn no se alivia despus de [employer].  La picazn aumenta.  Tiene fiebre.  La zona que le pica est enrojecida, se le hincha o le duele.  Observa que emana lquido, sangre o pus de la zona que le pica. Resumen  El prurito anal ocurre cuando siente picazn en la zona que rodea el ano.  Use los medicamentos de venta libre y los recetados solamente como se lo haya indicado el mdico.  Mantenga la higiene lavndose bien el cuerpo como se lo haya indicado el mdico.  Hable con el mdico sobre la posibilidad de tomar comprimidos de fibra (suplementos). Esta informacin no tiene Theme park manager el consejo del mdico. Asegrese de hacerle al mdico cualquier pregunta que tenga. Document Released: 03/31/2011 Document Revised: 01/05/2018 Document  Reviewed: 01/05/2018 Elsevier Interactive Patient Education  [employer].

## 2018-10-11 NOTE — Progress Notes (Signed)
Patient is having burning and irritation in anal area when using bathroom.

## 2019-06-20 ENCOUNTER — Other Ambulatory Visit: Payer: Self-pay

## 2019-06-20 ENCOUNTER — Ambulatory Visit: Payer: Self-pay | Attending: Internal Medicine

## 2019-07-19 ENCOUNTER — Ambulatory Visit: Payer: Self-pay | Admitting: Internal Medicine

## 2019-08-20 ENCOUNTER — Ambulatory Visit: Payer: Self-pay | Attending: Internal Medicine | Admitting: Pharmacist

## 2019-08-20 ENCOUNTER — Other Ambulatory Visit: Payer: Self-pay

## 2019-08-20 ENCOUNTER — Ambulatory Visit: Payer: Self-pay | Attending: Internal Medicine | Admitting: Family

## 2019-08-20 ENCOUNTER — Encounter: Payer: Self-pay | Admitting: Internal Medicine

## 2019-08-20 VITALS — BP 109/72 | HR 69 | Temp 98.3°F | Resp 16 | Ht 62.0 in | Wt 149.2 lb

## 2019-08-20 DIAGNOSIS — Z23 Encounter for immunization: Secondary | ICD-10-CM

## 2019-08-20 DIAGNOSIS — L290 Pruritus ani: Secondary | ICD-10-CM

## 2019-08-20 NOTE — Progress Notes (Signed)
Patient ID: Kristen Holt, female    DOB: 04/23/1976  MRN: [id number]  CC: GI Problem   Subjective: Kristen Holt is a 44 y.o. female who presents for GI problem.   1. GI PROBLEM:  Reports hasn't been able to wipe with toilet paper since July 2019. Feels irritation and itching at the anus and vaginal area. Has been wiping with soft rags to decrease irritation. Denies rash. Denies nausea. Denies vomiting. Denies diarrhea. Denies blood in urine and stool. Denies chest pain. Denies shortness of breath. Denies fever. Spicy food and alcohol makes it worse. Parke Simmers food makes it better. Was taking Herbalife over the counter which helped some but isn't currently taking it anymore. Has been prescribed suppositories and steroid cream for itching but it hasn't helped.   Patient Active Problem List   Diagnosis Date Noted  . Depression 01/01/2017  . Hypothyroid 01/01/2017  . Pap smear for cervical cancer screening 08/21/2013  . Allergic rhinitis 02/01/2013  . Acute appendicitis 01/13/2012     Current Outpatient Medications on File Prior to Visit  Medication Sig Dispense Refill  . diphenhydrAMINE (BENADRYL) 25 mg capsule Take 1 capsule (25 mg total) by mouth at bedtime. (Patient not taking: Reported on 08/20/2019) 30 capsule 0  . hydrocortisone (ANUSOL-HC) 2.5 % rectal cream Apply to rectal area 2 -3  Times a day (Patient not taking: Reported on 10/11/2018) 28.35 g 0  . hydrocortisone (ANUSOL-HC) 25 MG suppository Place 1 suppository (25 mg total) rectally 2 (two) times daily. (Patient not taking: Reported on 10/11/2018) 12 suppository 0  . hydrocortisone 1 % ointment Apply 1 application topically 2 (two) times daily. (Patient not taking: Reported on 08/20/2019) 30 g 0  . ketoconazole (NIZORAL) 2 % cream Apply 1 application topically 2 (two) times daily. (Patient not taking: Reported on 10/11/2018) 60 g 0  . metroNIDAZOLE (FLAGYL) 500 MG tablet Take 1 tablet (500 mg total) by mouth 2  (two) times daily. (Patient not taking: Reported on 10/11/2018) 14 tablet 0   No current facility-administered medications on file prior to visit.    No Known Allergies  Social History   Socioeconomic History  . Marital status: Single    Spouse name: Not on file  . Number of children: Not on file  . Years of education: Not on file  . Highest education level: Not on file  Occupational History  . Not on file  Tobacco Use  . Smoking status: Never Smoker  . Smokeless tobacco: Never Used  Substance and Sexual Activity  . Alcohol use: No  . Drug use: No  . Sexual activity: Yes    Partners: Male  Other Topics Concern  . Not on file  Social History Narrative   ** Merged History Encounter **       Social Determinants of Health   Financial Resource Strain:   . Difficulty of Paying Living Expenses: Not on file  Food Insecurity:   . Worried About Programme researcher, broadcasting/film/video in the Last Year: Not on file  . Ran Out of Food in the Last Year: Not on file  Transportation Needs:   . Lack of Transportation (Medical): Not on file  . Lack of Transportation (Non-Medical): Not on file  Physical Activity:   . Days of Exercise per Week: Not on file  . Minutes of Exercise per Session: Not on file  Stress:   . Feeling of Stress : Not on file  Social Connections:   . Frequency of Communication with  Friends and Family: Not on file  . Frequency of Social Gatherings with Friends and Family: Not on file  . Attends Religious Services: Not on file  . Active Member of Clubs or Organizations: Not on file  . Attends Archivist Meetings: Not on file  . Marital Status: Not on file  Intimate Partner Violence:   . Fear of Current or Ex-Partner: Not on file  . Emotionally Abused: Not on file  . Physically Abused: Not on file  . Sexually Abused: Not on file    Family History  Problem Relation Age of Onset  . Diabetes Mother   . Breast cancer Neg Hx     Past Surgical History:  Procedure  Laterality Date  . APPENDECTOMY    . LAPAROSCOPIC APPENDECTOMY  01/18/2012   Procedure: APPENDECTOMY LAPAROSCOPIC;  Surgeon: Haywood Lasso, MD;  Location: [facility] OR;  Service: General;  Laterality: N/A;    ROS: Review of Systems Negative except as stated above  PHYSICAL EXAM: BP 109/72   Pulse 69   Temp 98.3 F (36.8 C) (Oral)   Resp 16   Ht 5\' 2"  (1.575 m)   Wt 149 lb 3.2 oz (67.7 kg)   SpO2 99%   BMI 27.29 kg/m   Physical Exam  General appearance - alert, well appearing, and in no distress and oriented to person, place, and time Chest - clear to auscultation, no wheezes, rales or rhonchi, symmetric air entry, no tachypnea, retractions or cyanosis Heart - normal rate, regular rhythm, normal S1, S2, no murmurs, rubs, clicks or gallops, normal rate and regular rhythm, S1 and S2 normal, no murmurs noted, no gallops noted Abdomen - soft, nontender, nondistended, no masses or organomegaly bowel sounds normal  CMP Latest Ref Rng & Units 11/17/2017 04/09/2017 12/03/2016  Glucose 65 - 99 mg/dL 102(H) 136(H) 100(H)  BUN 6 - 24 mg/dL 14 7 10   Creatinine 0.57 - 1.00 mg/dL 0.57 0.56 0.57  Sodium 134 - 144 mmol/L 141 136 134(L)  Potassium 3.5 - 5.2 mmol/L 4.1 3.3(L) 3.4(L)  Chloride 96 - 106 mmol/L 110(H) 107 103  CO2 20 - 29 mmol/L 20 22 22   Calcium 8.7 - 10.2 mg/dL 8.7 8.8(L) 8.4(L)  Total Protein 6.0 - 8.5 g/dL 6.5 6.8 7.0  Total Bilirubin 0.0 - 1.2 mg/dL <0.2 0.5 0.5  Alkaline Phos 39 - 117 IU/L 47 45 68  AST 0 - 40 IU/L 22 24 29   ALT 0 - 32 IU/L 23 22 27    Lipid Panel  No results found for: CHOL, TRIG, HDL, CHOLHDL, VLDL, LDLCALC, LDLDIRECT  CBC    Component Value Date/Time   WBC 6.9 11/17/2017 1524   WBC 9.2 04/12/2017 1219   RBC 4.20 11/17/2017 1524   RBC 4.08 04/12/2017 1219   HGB 12.2 11/17/2017 1524   HCT 38.1 11/17/2017 1524   PLT 337 11/17/2017 1524   MCV 91 11/17/2017 1524   MCH 29.0 11/17/2017 1524   MCH 29.2 04/12/2017 1219   MCHC 32.0 11/17/2017 1524    MCHC 33.6 04/12/2017 1219   RDW 14.4 11/17/2017 1524   LYMPHSABS 2.6 11/23/2016 1639   MONOABS 0.6 09/10/2015 1101   EOSABS 0.4 11/23/2016 1639   BASOSABS 0.1 11/23/2016 1639    ASSESSMENT AND PLAN: 1. PRURITIS ANI:  -Referral to endoscopy  Patient was given clear instructions to go to Emergency Department or return to medical center if symptoms don't improve, worsen, or new problems develop.The patient verbalized understanding. Patient was given the opportunity  to ask questions.  Patient verbalized understanding of the plan and was able to repeat key elements of the plan.   Requested Prescriptions    No prescriptions requested or ordered in this encounter    Khyra Viscuso Jodi Geralds, NP

## 2019-08-20 NOTE — Progress Notes (Signed)
Patient presents for vaccination against influenza per orders of Dr. Johnson. Consent given. Counseling provided. No contraindications exists. Vaccine administered without incident.   

## 2019-08-20 NOTE — Patient Instructions (Addendum)
Referral to Endoscopy. Anal Pruritus Anal pruritus is when the area around the opening of the butt (anus) feels itchy. This itchy feeling often happens when the area is moist. Moisture may come from sweat or from a small amount of poop (stool) that is left on the area. Many other things can also cause the itching. These include:  Perfumed soaps and sprays.  Colored or scented toilet paper.  Washing the area too much.  Eating certain foods.  Having watery poop (diarrhea).  Certain skin conditions or other medical conditions. The itching often goes away with home care. Scratching can damage the skin and make the problem worse. Follow these instructions at home: Skin care      Keep clean by washing your body well. ? Clean the affected area with wet toilet paper or a wet washcloth. Do this after you poop and at bedtime. ? Avoid using soaps on this area. ? Dry the area well. Pat the area dry with toilet paper or a towel.  Do not scrub the area with anything, even toilet paper.  Do not scratch the itchy area. Scratching makes the itching worse.  Take a warm water bath (sitz bath) as told by your doctor. ? A sitz bath is a warm water bath that only comes up to your hips and covers your butt. A sitz bath may done at home in a bathtub. It may also be done with a portable sitz bath that fits over the toilet. ? Pat the area dry with a soft cloth after each bath.  Use creams or ointments as told by your doctor.  Do not use bubble bath, scented toilet paper, or genital deodorants. General instructions  Watch for any changes in your symptoms.  Use over-the-counter and prescription medicines only as told by your doctor.  Avoid using medicines that help you poop (laxatives).  Talk with your doctor about increasing the fiber in your diet. This can help keep your poop normal if you often have loose poop.  Limit or avoid foods that may cause your symptoms. These foods may  include: ? Spicy foods, such as salsa, jalapeo peppers, and spicy seasonings. ? Foods with caffeine in them. ? Beer. ? Milk products. ? Chocolate. ? Nuts. ? Citrus fruits. ? Tomatoes.  Wear cotton underwear and loose clothing.  Keep all follow-up visits as told by your doctor. This is important. Contact a doctor if:  Your itching does not get better after a few days.  Your itching gets worse.  You have a fever.  You have redness, swelling, or pain in the itchy area.  You have fluid, blood, or pus coming from the itchy area. Summary  Anal pruritus is when the area around the opening of the butt (anus) feels itchy.  Use over-the-counter and prescription medicines only as told by your doctor.  Keep clean by washing your body well as told by your doctor.  Talk with your doctor about taking fiber pills (supplements). This information is not intended to replace advice given to you by your health care provider. Make sure you discuss any questions you have with your health care provider. Document Revised: 11/28/2017 Document Reviewed: 11/28/2017 Elsevier Patient Education  2020 [employer].

## 2019-09-14 ENCOUNTER — Telehealth: Payer: Self-pay | Admitting: Family

## 2019-09-14 ENCOUNTER — Other Ambulatory Visit: Payer: Self-pay

## 2019-09-14 ENCOUNTER — Ambulatory Visit: Payer: Self-pay | Attending: Internal Medicine

## 2019-09-14 DIAGNOSIS — L290 Pruritus ani: Secondary | ICD-10-CM

## 2019-09-14 NOTE — Telephone Encounter (Signed)
Patient came requesting a  Endoscopy referral    Thank you .

## 2019-09-14 NOTE — Telephone Encounter (Signed)
Endoscopy referral was sent 08/20/2019 and showing in chart under the "Procedures" tab.

## 2019-09-14 NOTE — Telephone Encounter (Signed)
Can you placed as referral  thanks

## 2019-10-02 ENCOUNTER — Telehealth: Payer: Self-pay | Admitting: Internal Medicine

## 2019-10-02 NOTE — Telephone Encounter (Signed)
Good Afternoon   It need to be placed as Referral  no order .

## 2019-10-02 NOTE — Telephone Encounter (Signed)
Wll forward to provider that seen her. Also pt doesn't have any insurance

## 2019-10-02 NOTE — Telephone Encounter (Signed)
Referral for endo has not reached pt and she has concerns because she is still experiencing issues. Its the procedure Amy ordered

## 2019-10-02 NOTE — Telephone Encounter (Signed)
Endoscopy ordered 08/20/2019 (documented in encounter note) and located in the procedures tab of the patient's chart. Follow-up with patient on 09/14/19 (documented in progress note) confirmed endoscopy referral had been placed. Patient is uninsured may need to apply for orange card/[place] discount. Arna Medici, can you assist me with referral processing please?

## 2019-10-02 NOTE — Telephone Encounter (Signed)
Referral has been re ordered

## 2019-10-05 ENCOUNTER — Telehealth: Payer: Self-pay | Admitting: Family

## 2019-10-05 ENCOUNTER — Other Ambulatory Visit: Payer: Self-pay | Admitting: Family

## 2019-10-05 DIAGNOSIS — L290 Pruritus ani: Secondary | ICD-10-CM

## 2019-10-05 NOTE — Telephone Encounter (Signed)
Opened in error please refer to previous note.

## 2019-10-05 NOTE — Telephone Encounter (Signed)
I placed referral. Can I come to your office so we can discuss together?

## 2019-10-05 NOTE — Addendum Note (Signed)
Addended by: Rema Fendt on: 10/05/2019 01:19 PM   Modules accepted: Orders

## 2019-10-05 NOTE — Addendum Note (Signed)
Addended by: Kyren Knick J on: 10/05/2019 01:19 PM   Modules accepted: Orders  

## 2019-10-05 NOTE — Telephone Encounter (Signed)
Mrs Kristen Holt   I don't see the referral placed to Gi  but I found the order again  Thanks

## 2019-10-26 ENCOUNTER — Encounter: Payer: Self-pay | Admitting: Family Medicine

## 2019-10-26 ENCOUNTER — Encounter: Payer: Self-pay | Admitting: Nurse Practitioner

## 2019-11-06 ENCOUNTER — Ambulatory Visit (INDEPENDENT_AMBULATORY_CARE_PROVIDER_SITE_OTHER): Payer: Self-pay | Admitting: Nurse Practitioner

## 2019-11-06 ENCOUNTER — Encounter: Payer: Self-pay | Admitting: Nurse Practitioner

## 2019-11-06 ENCOUNTER — Other Ambulatory Visit: Payer: Self-pay

## 2019-11-06 VITALS — BP 110/68 | HR 75 | Temp 98.2°F | Ht 62.0 in | Wt 142.2 lb

## 2019-11-06 DIAGNOSIS — L290 Pruritus ani: Secondary | ICD-10-CM

## 2019-11-06 DIAGNOSIS — K5900 Constipation, unspecified: Secondary | ICD-10-CM

## 2019-11-06 DIAGNOSIS — R1084 Generalized abdominal pain: Secondary | ICD-10-CM

## 2019-11-06 MED ORDER — FAMOTIDINE 20 MG PO TABS: 20.0000 mg | ORAL_TABLET | Freq: Every day | ORAL | 1 refills | Status: DC

## 2019-11-06 NOTE — Progress Notes (Signed)
11/06/2019 Kristen Holt [id number] [birth year]   CHIEF COMPLAINT:  Anal irritation, constipation   HISTORY OF PRESENT ILLNESS:  Kristen Holt is a 44 year old female with no significant past medical history. Past surgical history includes an  Appendectomy in  2013. She reported having difficulty awakening from general anesthesia following her appendectomy.   She presents today accompanied by a spanish interpretor. She presents today as referred by her PCP Ricky Stabs NP for further evaluation for anal irritation. She develops anal irritation after she eats spicy foods or drinks beer or wine. The next day she passes a normal brown solid BM with associated anal irritation and flatulence. No rectal bleeding. No severe rectal pain. She has occasional stomach pain, noisy stomach which occurs every time she eats for the past 8 months. No nausea or vomiting. She has infrequent lower abdominal pain that goes away after she passes a BM. She sometimes feels like she needs to pass a BM but does not. She reports having constipation, passing smaller amounts of stool 2 to 3 times weekly for the past 8 months. No weight loss. No fever, sweats or chills. No family history of esophageal, gastric or colon cancer.    Past Medical History:  Diagnosis Date  . Acute appendicitis 01/13/2012  . Thyroid disease    Past Surgical History:  Procedure Laterality Date  . APPENDECTOMY    . LAPAROSCOPIC APPENDECTOMY  01/18/2012   Procedure: APPENDECTOMY LAPAROSCOPIC;  Surgeon: Currie Paris, MD;  Location: [facility] OR;  Service: General;  Laterality: N/A;    Family History: Mother deceased age 67 due to diabetes complications. Father deceased age 34 fell off horse.   3 brothers and 3 sisters. Oldest sister with DM. No family history of esophageal, gastric or colon cancer.   Social History: She is single. She has 3 sons and 1 daughter. Nonsmoker. She drinks 2 beers on the weekends. No drug use.    Medications: none  Allergies: none  REVIEW OF SYSTEMS: All other systems reviewed and negative except where noted in the History of Present Illness. Headache and dizzy yesterday. Skipped lunch.   PHYSICAL EXAM: BP 110/68   Pulse 75   Temp 98.2 F (36.8 C)   Ht 5\' 2"  (1.575 m)   Wt 142 lb 4 oz (64.5 kg)   BMI 26.02 kg/m   LMP 10/29/2019 General: Well developed  44 year old female in no acute distress. Head: Normocephalic and atraumatic. Eyes:  Sclerae non-icteric, conjunctive pink. Ears: Normal auditory acuity. Mouth: Dentition intact. No ulcers or lesions.  Neck: Supple, no lymphadenopathy or thyromegaly.  Lungs: Clear bilaterally to auscultation without wheezes, crackles or rhonchi. Heart: Regular rate and rhythm. No murmur, rub or gallop appreciated.  Abdomen: Soft, nontender, non distended. No masses. No hepatosplenomegaly. Normoactive bowel sounds x 4 quadrants.  Rectal: No perianal rash or lesions. No external hemorrhoids or fissure. Small internal hemorrhoids palpated. No stool or blood in the rectum. No mass.  Musculoskeletal: Symmetrical with no gross deformities. Skin: Warm and dry. No rash or lesions on visible extremities. Extremities: No edema. Neurological: Alert oriented x 4, no focal deficits.  Psychological:  Alert and cooperative. Normal mood and affect.  ASSESSMENT AND PLAN:  82. 44 year old female with ani pruritis which occurs after eating spicy foods or drinking wine or beer. Rectal exam showed small internal hemorrhoids, normal perianal exam.  -Desitin apply a small amount to the anal area as needed  -Avoid spicy foods and alcohol -  Follow up in the office in 4 to 6 weeks, to discuss a diagnostic colonoscopy at time of follow up appointment.   2. Constipation  -CBC, TSH, CMP and CRP -Miralax Q HS PRN -Benefiber  3. Upper abdominal pain  -H. Pylori breath test -See lab order in # 2 -Famotidine 20mg  QD, start after H. Pylori breath test completed   -To discuss scheduling an EGD at time of follow up appointment in 4 to 6 weeks.    CC:  Ladell Pier, MD

## 2019-11-06 NOTE — Patient Instructions (Addendum)
If you are age 43 or older, your body mass index should be between 23-30. Your Body mass index is 26.02 kg/m. If this is out of the aforementioned range listed, please consider follow up with your Primary Care Provider.  If you are age 25 or younger, your body mass index should be between 19-25. Your Body mass index is 26.02 kg/m. If this is out of the aformentioned range listed, please consider follow up with your Primary Care Provider.   You have been given a box for your H pylori Breath test, please follow the instructions enclosed.   We have sent the following medications to your pharmacy for you to pick up at your convenience: Famotidine 20 mg once daily (start after completing the H Pylori Breath Test)   Please purchase the following medications over the counter and take as directed:Miralax 1 capful mixed in 8 ounces of water once daily/  Benefiber 1 tablespoon once daily.  Desitin Apply small amount to anal area 2-3 times daily.  Please go to the lab at [facility] ([address] [place] You will need to go to level "B", you do not need an appointment for this. Hours available are 7:30 am - 4:30 pm.     Follow up in 4-6 weeks.   Thank you, Alcide Evener, NP

## 2019-11-12 ENCOUNTER — Other Ambulatory Visit (INDEPENDENT_AMBULATORY_CARE_PROVIDER_SITE_OTHER): Payer: Self-pay

## 2019-11-12 DIAGNOSIS — L290 Pruritus ani: Secondary | ICD-10-CM

## 2019-11-12 DIAGNOSIS — K5900 Constipation, unspecified: Secondary | ICD-10-CM

## 2019-11-12 DIAGNOSIS — R1084 Generalized abdominal pain: Secondary | ICD-10-CM

## 2019-11-12 LAB — COMPREHENSIVE METABOLIC PANEL
ALT: 14 U/L (ref 0–35)
AST: 17 U/L (ref 0–37)
Albumin: 3.9 g/dL (ref 3.5–5.2)
Alkaline Phosphatase: 58 U/L (ref 39–117)
BUN: 12 mg/dL (ref 6–23)
CO2: 26 mEq/L (ref 19–32)
Calcium: 8.9 mg/dL (ref 8.4–10.5)
Chloride: 103 mEq/L (ref 96–112)
Creatinine, Ser: 0.5 mg/dL (ref 0.40–1.20)
GFR: 134.61 mL/min (ref >60.00)
Glucose, Bld: 98 mg/dL (ref 70–99)
Potassium: 3.7 mEq/L (ref 3.5–5.1)
Sodium: 135 mEq/L (ref 135–145)
Total Bilirubin: 0.2 mg/dL (ref 0.2–1.2)
Total Protein: 6.6 g/dL (ref 6.0–8.3)

## 2019-11-12 LAB — CBC WITH DIFFERENTIAL/PLATELET
Basophils Absolute: 0.1 10*3/uL (ref 0.0–0.1)
Basophils Relative: 0.9 % (ref 0.0–3.0)
Eosinophils Absolute: 0.3 10*3/uL (ref 0.0–0.7)
Eosinophils Relative: 3.5 % (ref 0.0–5.0)
HCT: 33.2 % — ABNORMAL LOW (ref 36.0–46.0)
Hemoglobin: 10.9 g/dL — ABNORMAL LOW (ref 12.0–15.0)
Lymphocytes Relative: 32.6 % (ref 12.0–46.0)
Lymphs Abs: 2.7 10*3/uL (ref 0.7–4.0)
MCHC: 32.8 g/dL (ref 30.0–36.0)
MCV: 80.5 fl (ref 78.0–100.0)
Monocytes Absolute: 0.9 10*3/uL (ref 0.1–1.0)
Monocytes Relative: 10.9 % (ref 3.0–12.0)
Neutro Abs: 4.3 10*3/uL (ref 1.4–7.7)
Neutrophils Relative %: 52.1 % (ref 43.0–77.0)
Platelets: 335 10*3/uL (ref 150.0–400.0)
RBC: 4.12 Mil/uL (ref 3.87–5.11)
RDW: 17.3 % — ABNORMAL HIGH (ref 11.5–15.5)
WBC: 8.2 10*3/uL (ref 4.0–10.5)

## 2019-11-12 LAB — C-REACTIVE PROTEIN: CRP: <1 mg/dL (ref 0.5–20.0)

## 2019-11-12 LAB — TSH: TSH: 2.2 u[IU]/mL (ref 0.35–4.50)

## 2019-11-12 NOTE — Progress Notes (Signed)
Agree with the assessment and plan as outlined by Alcide Evener, NP.  Further studies as outlined, with plan for follow-up in the GI clinic in 4-6 weeks.  If ongoing symptoms, may benefit from EGD to assess upper GI symptoms along with colonoscopy to assess lower GI symptoms.  Can assess for grade/severity of internal hemorrhoids at time of colonoscopy, which may benefit from hemorrhoid band ligation for treatment of rectal irritation/pruritus ani.  Doristine Locks, DO, [facility]

## 2019-11-16 ENCOUNTER — Other Ambulatory Visit: Payer: Self-pay | Admitting: Nurse Practitioner

## 2019-11-16 DIAGNOSIS — D509 Iron deficiency anemia, unspecified: Secondary | ICD-10-CM

## 2019-11-21 ENCOUNTER — Ambulatory Visit: Payer: Self-pay | Attending: Internal Medicine

## 2019-11-21 ENCOUNTER — Encounter: Payer: Self-pay | Admitting: Nurse Practitioner

## 2019-11-21 DIAGNOSIS — Z20822 Contact with and (suspected) exposure to covid-19: Secondary | ICD-10-CM

## 2019-11-22 LAB — SARS-COV-2, NAA 2 DAY TAT

## 2019-11-22 LAB — NOVEL CORONAVIRUS, NAA: SARS-CoV-2, NAA: DETECTED — AB

## 2019-11-28 ENCOUNTER — Ambulatory Visit: Payer: Self-pay | Admitting: Nurse Practitioner

## 2019-12-14 ENCOUNTER — Ambulatory Visit (INDEPENDENT_AMBULATORY_CARE_PROVIDER_SITE_OTHER): Payer: Self-pay | Admitting: Nurse Practitioner

## 2019-12-14 ENCOUNTER — Other Ambulatory Visit (INDEPENDENT_AMBULATORY_CARE_PROVIDER_SITE_OTHER): Payer: Self-pay

## 2019-12-14 ENCOUNTER — Encounter: Payer: Self-pay | Admitting: Nurse Practitioner

## 2019-12-14 VITALS — BP 96/58 | HR 72 | Temp 98.9°F | Ht 61.0 in | Wt 147.2 lb

## 2019-12-14 DIAGNOSIS — D509 Iron deficiency anemia, unspecified: Secondary | ICD-10-CM

## 2019-12-14 DIAGNOSIS — R1013 Epigastric pain: Secondary | ICD-10-CM

## 2019-12-14 DIAGNOSIS — D649 Anemia, unspecified: Secondary | ICD-10-CM

## 2019-12-14 DIAGNOSIS — R5383 Other fatigue: Secondary | ICD-10-CM

## 2019-12-14 DIAGNOSIS — K5900 Constipation, unspecified: Secondary | ICD-10-CM

## 2019-12-14 LAB — CBC
HCT: 35.6 % — ABNORMAL LOW (ref 36.0–46.0)
Hemoglobin: 11.8 g/dL — ABNORMAL LOW (ref 12.0–15.0)
MCHC: 33 g/dL (ref 30.0–36.0)
MCV: 82.9 fl (ref 78.0–100.0)
Platelets: 333 10*3/uL (ref 150.0–400.0)
RBC: 4.29 Mil/uL (ref 3.87–5.11)
RDW: 20.1 % — ABNORMAL HIGH (ref 11.5–15.5)
WBC: 8.5 10*3/uL (ref 4.0–10.5)

## 2019-12-14 LAB — IGA: IgA: 260 mg/dL (ref 68–378)

## 2019-12-14 LAB — IBC + FERRITIN
Ferritin: 25.7 ng/mL (ref 10.0–291.0)
Iron: 206 ug/dL — ABNORMAL HIGH (ref 42–145)
Saturation Ratios: 52.2 % — ABNORMAL HIGH (ref 20.0–50.0)
Transferrin: 282 mg/dL (ref 212.0–360.0)

## 2019-12-14 NOTE — Progress Notes (Signed)
12/14/2019 Kristen Holt [id number] [birth year]   Chief Complaint: Fatigue, follow up abdominal pain and constipation   History of Present Illness: Kristen Holt is a 44 year old female with no significant past medical history. Past surgical history includes an  appendectomy in  2013. She reported having difficulty awakening from general anesthesia following her appendectomy surgery. She presents today accompanied by a [place] interpreter. She was seen in the office of 11/06/2019 with complaints of upper abdominal pain, constipation and ani pruritis. Her stomach pain resolved after taking an herbal tea. She is avoiding spicy foods. She did not take Famotidine as recommended. H. Pylori breath test was not completed. She increased her water intake and her constipation improved. She used Desitin for her anal itchiness which has significantly reduced her anal itchiness. She notices more anal itchiness when she wipes with toilet tissue. She does not wipe aggressively. She washes with soap and water and dries herself with a towel which has reduced her anal itchiness as well. No rectal bleeding. Since her office visit, she developed URI symptoms and she tested + for COVID 19 on 11/21/2019.  Her laboratory results show a normocytic anemia. Hg 10.9. MCV 80.5. She felt fatigued and a fried recommended for her to take iron. She started taking Ferrous Sulfate 377m po QD about 2 weeks ago. She still fills fatigued. She denies having menorrhagia. Her menstrual cycles are regular, typically has light bleeding first 2 days, 3rd day is heavier bleeding then 4th and 5th days are lighter flow. She had less menstrual bleeding with her most recent cycle. She denies any chance of pregnancy at this time, she is not sexually active. We previously discussed scheduling an EGD and colonoscopy if her symptoms persisted or worsened. At this time her presenting symptoms including stomach pain,  constipation and anal itchiness have mostly resolved. However, due to her ongoing fatigue which she reported having prior to contracting Covid 19, and anemia she wishes to proceed with an EGD and colonoscopy at this time. She wishes to feel better.   Labs 11/12/2019: Na 135. K 3.7. BUN 12. Cr. 0.50. Alk phos 58. AST 17. ALT 14. T. Bili 0.2. CRP < 1.0. WBC 8.2. Hg 10.9. HCT 33.2. MCV 80.5. PLT 335.  TSH 2.20.    Current Outpatient Medications on File Prior to Visit  Medication Sig Dispense Refill  . Diaper Rash Products (DESITIN) OINT Apply topically daily as needed.    . ferrous sulfate 325 (65 FE) MG tablet Take 325 mg by mouth daily with breakfast.     No current facility-administered medications on file prior to visit.  ' No Known Allergies   Current Medications, Allergies, Past Medical History, Past Surgical History, Family History and Social History were reviewed in [employer] [facility].   Physical Exam: BP (!) 96/58 (BP Location: Left Arm, Patient Position: Sitting, Cuff Size: Normal)   Pulse 72   Temp 98.9 F (37.2 C)   Ht _0  (1.549 m) Comment: height measured without shoes  Wt 147 lb 4 oz (66.8 kg)   LMP 11/23/2019   Breastfeeding No   BMI 27.82 kg/m  General: Well developed 44year old female in no acute distress. Head: Normocephalic and atraumatic. Eyes: No scleral icterus. Conjunctiva pink . Ears: Normal auditory acuity. Mouth: Dentition intact. No ulcers or lesions.  Lungs: Clear throughout to auscultation. Heart: Regular rate and rhythm, no murmur. Abdomen: Soft, nontender and nondistended. No masses or hepatomegaly. Normal bowel  sounds x 4 quadrants.  Rectal: Deferred.  Musculoskeletal: Symmetrical with no gross deformities. Extremities: No edema. Neurological: Alert oriented x 4. No focal deficits.  Psychological: Alert and cooperative. Normal mood and affect  Assessment and Recommendations:  13. 44 year old female with normocytic  anemia and fatigue. No history of menorrhagia. Variable GI sx including upper abdominal pain, constipation and anal itchiness which have improved/resolved without evidence of GI bleeding. Patient self started Ferrous Sulfate 332m po daily 2 weeks ago. She wishes to proceed with EGD/colonoscopy due to her recent GI sx and ongoing fatigue.  -CBC, iron, iron saturation, TIBC, ferritin, tTg and IgA -EGD and colonoscopy benefits and risks discussed including risk with sedation, risk of bleeding, perforation and infection  -Dr. CBryan Lemmato review the above plan prior to patient proceeding with EGD/colonoscopy   2. Upper abdominal pain, resolved. Abdominal bloat.  -H. Pylori stool antigen  -See plan in # 1  3. Constipation, resolved  -See plan in # 1  4. Ani pruritis, resolved  -Avoid excessive washing to anal area after defecation -Desitin PRN

## 2019-12-14 NOTE — Patient Instructions (Addendum)
If you are age 43 or older, your body mass index should be between 23-30. Your Body mass index is 27.82 kg/m. If this is out of the aforementioned range listed, please consider follow up with your Primary Care Provider.  If you are age 15 or younger, your body mass index should be between 19-25. Your Body mass index is 27.82 kg/m. If this is out of the aformentioned range listed, please consider follow up with your Primary Care Provider.   Your provider has requested that you go to the basement level for lab work before leaving today. Press "B" on the elevator. The lab is located at the first door on the left as you exit the elevator.  Stop taking your Ferrous Sulfate today until after your Colonoscopy/EGD.  Sample given for [place]  Due to recent changes in healthcare laws, you may see the results of your imaging and laboratory studies on MyChart before your provider has had a chance to review them.  We understand that in some cases there may be results that are confusing or concerning to you. Not all laboratory results come back in the same time frame and the provider may be waiting for multiple results in order to interpret others.  Please give [place] 48 hours in order for your provider to thoroughly review all the results before contacting the office for clarification of your results.

## 2019-12-15 DIAGNOSIS — K5900 Constipation, unspecified: Secondary | ICD-10-CM | POA: Insufficient documentation

## 2019-12-15 DIAGNOSIS — D649 Anemia, unspecified: Secondary | ICD-10-CM | POA: Insufficient documentation

## 2019-12-15 DIAGNOSIS — R5383 Other fatigue: Secondary | ICD-10-CM | POA: Insufficient documentation

## 2019-12-17 ENCOUNTER — Other Ambulatory Visit: Payer: Self-pay

## 2019-12-17 DIAGNOSIS — R1013 Epigastric pain: Secondary | ICD-10-CM

## 2019-12-17 DIAGNOSIS — D509 Iron deficiency anemia, unspecified: Secondary | ICD-10-CM

## 2019-12-17 LAB — IRON, TOTAL/TOTAL IRON BINDING CAP
%SAT: 61 % (calc) — ABNORMAL HIGH (ref 16–45)
Iron: 216 ug/dL — ABNORMAL HIGH (ref 40–190)
TIBC: 355 mcg/dL (calc) (ref 250–450)

## 2019-12-17 LAB — TISSUE TRANSGLUTAMINASE, IGA: (tTG) Ab, IgA: 1 U/mL

## 2019-12-18 LAB — HELICOBACTER PYLORI  SPECIAL ANTIGEN
MICRO NUMBER:: 10485299
SPECIMEN QUALITY: ADEQUATE

## 2019-12-18 NOTE — Progress Notes (Signed)
Agree with the assessment and plan as outlined by Alcide Evener, NP.  Plan to hold PO iron 7 days prior to EGD/Colonoscopy. Plan for duodenal bxs at time of EGD given anemia. Will f/u on labs as ordered as well.   Doristine Locks, DO, [facility]

## 2019-12-19 ENCOUNTER — Other Ambulatory Visit: Payer: Self-pay

## 2019-12-19 ENCOUNTER — Ambulatory Visit: Payer: Self-pay | Admitting: Gastroenterology

## 2019-12-19 VITALS — BP 109/71 | HR 69 | Temp 97.3°F | Ht 61.0 in | Wt 147.0 lb

## 2019-12-19 DIAGNOSIS — R1013 Epigastric pain: Secondary | ICD-10-CM

## 2019-12-19 DIAGNOSIS — K5900 Constipation, unspecified: Secondary | ICD-10-CM

## 2019-12-19 MED ORDER — SODIUM CHLORIDE 0.9 % IV SOLN: 500.0000 mL | INTRAVENOUS | Status: DC

## 2019-12-19 NOTE — Progress Notes (Signed)
Temp JB  v/s CW  Pt drank 3 0z water at 1320 and ate rice mixed with water at 1200 today. NOtifed CRNA and MD and it was decided to reschedule the procedures to Friday May 21 at 1000. Pt instructed to arrive at 0900 and to drink clear liquids only til 0700 friday morning. These instructions were given to pt in spanish and she stated she understood all instructions.

## 2019-12-20 ENCOUNTER — Telehealth: Payer: Self-pay | Admitting: General Surgery

## 2019-12-20 NOTE — Telephone Encounter (Signed)
-----   Message from Arnaldo Natal, NP sent at 12/20/2019  7:53 AM EDT ----- French Ana pls have Yesi contact patient, H. Pylori stool antigen negative. Pls make sure patient has stopped her ferrous sulfate as requested in the message in her lab notes. Thx

## 2019-12-21 ENCOUNTER — Ambulatory Visit (AMBULATORY_SURGERY_CENTER): Payer: Self-pay | Admitting: Gastroenterology

## 2019-12-21 ENCOUNTER — Other Ambulatory Visit: Payer: Self-pay

## 2019-12-21 ENCOUNTER — Encounter: Payer: Self-pay | Admitting: Gastroenterology

## 2019-12-21 VITALS — BP 105/59 | HR 60 | Temp 97.5°F | Resp 20 | Ht 61.0 in | Wt 147.0 lb

## 2019-12-21 DIAGNOSIS — R1013 Epigastric pain: Secondary | ICD-10-CM

## 2019-12-21 DIAGNOSIS — K5900 Constipation, unspecified: Secondary | ICD-10-CM

## 2019-12-21 DIAGNOSIS — R140 Abdominal distension (gaseous): Secondary | ICD-10-CM

## 2019-12-21 DIAGNOSIS — K2940 Chronic atrophic gastritis without bleeding: Secondary | ICD-10-CM

## 2019-12-21 DIAGNOSIS — K2970 Gastritis, unspecified, without bleeding: Secondary | ICD-10-CM

## 2019-12-21 DIAGNOSIS — Z538 Procedure and treatment not carried out for other reasons: Secondary | ICD-10-CM

## 2019-12-21 DIAGNOSIS — D649 Anemia, unspecified: Secondary | ICD-10-CM

## 2019-12-21 MED ORDER — SODIUM CHLORIDE 0.9 % IV SOLN: 500.0000 mL | Freq: Once | INTRAVENOUS | Status: DC

## 2019-12-21 NOTE — Progress Notes (Signed)
A and O x3. Report to RN. Tolerated MAC anesthesia well.Teeth unchanged after procedure.

## 2019-12-21 NOTE — Progress Notes (Signed)
Called to room to assist during endoscopic procedure.  Patient ID and intended procedure confirmed with present staff. Received instructions for my participation in the procedure from the performing physician.  

## 2019-12-21 NOTE — Progress Notes (Signed)
Vitals-DT  Pt's states no medical or surgical changes since previsit or office visit.  

## 2019-12-21 NOTE — Op Note (Signed)
[facility] Patient Name: Kristen Holt Procedure Date: 12/21/2019 10:11 AM MRN: [id number] Endoscopist: Gerrit Heck , MD Age: 44 Referring MD:  Date of Birth: 1976-07-26 Gender: Female Account #: [id number] Procedure:                Upper GI endoscopy Indications:              Generalized abdominal pain, Abdominal bloating,                            Normocytic anemia, Constipation Medicines:                Monitored Anesthesia Care Procedure:                Pre-Anesthesia Assessment:                           - Prior to the procedure, a History and Physical                            was performed, and patient medications and                            allergies were reviewed. The patient's tolerance of                            previous anesthesia was also reviewed. The risks                            and benefits of the procedure and the sedation                            options and risks were discussed with the patient.                            All questions were answered, and informed consent                            was obtained. Prior Anticoagulants: The patient has                            taken no previous anticoagulant or antiplatelet                            agents. ASA Grade Assessment: II - A patient with                            mild systemic disease. After reviewing the risks                            and benefits, the patient was deemed in                            satisfactory condition to undergo the procedure.  After obtaining informed consent, the endoscope was                            passed under direct vision. Throughout the                            procedure, the patient's blood pressure, pulse, and                            oxygen saturations were monitored continuously. The                            Endoscope was introduced through the mouth, and                            advanced to  the second part of duodenum. The upper                            GI endoscopy was accomplished without difficulty.                            The patient tolerated the procedure well. Scope In: Scope Out: Findings:                 The examined esophagus was normal.                           The Z-line was regular.                           The gastroesophageal flap valve was visualized                            endoscopically and classified as Hill Grade III                            (minimal fold, loose to endoscope, hiatal hernia                            likely).                           The entire examined stomach was normal. Biopsies                            were taken with a cold forceps for Helicobacter                            pylori testing. Estimated blood loss was minimal.                           The duodenal bulb, first portion of the duodenum                            and second portion of the duodenum were normal.  Biopsies for histology were taken with a cold                            forceps for evaluation of celiac disease. Estimated                            blood loss was minimal. Complications:            No immediate complications. Estimated Blood Loss:     Estimated blood loss was minimal. Impression:               - Normal esophagus.                           - Z-line regular.                           - Gastroesophageal flap valve classified as Hill                            Grade III (minimal fold, loose to endoscope, hiatal                            hernia likely).                           - Normal stomach. Biopsied.                           - Normal duodenal bulb, first portion of the                            duodenum and second portion of the duodenum.                            Biopsied. Recommendation:           - Patient has a contact number available for                            emergencies. The signs and  symptoms of potential                            delayed complications were discussed with the                            patient. Return to normal activities tomorrow.                            Written discharge instructions were provided to the                            patient.                           - Resume previous diet.                           -  Continue present medications.                           - Await pathology results.                           - Perform a colonoscopy today. Doristine Locks, MD 12/21/2019 10:44:21 AM

## 2019-12-21 NOTE — Telephone Encounter (Signed)
Spoke to Ms Kristen Holt in spanish and relayed lab results. She verbalized understanding. At this time she had no questions but has our office number and is aware she can call back at any time she has questions.

## 2019-12-21 NOTE — Patient Instructions (Signed)
YOU HAD AN ENDOSCOPIC PROCEDURE TODAY AT THE [facility]:   Refer to the procedure report that was given to you for any specific questions about what was found during the examination.  If the procedure report does not answer your questions, please call your gastroenterologist to clarify.  If you requested that your care partner not be given the details of your procedure findings, then the procedure report has been included in a sealed envelope for you to review at your convenience later.  YOU SHOULD EXPECT: Some feelings of bloating in the abdomen. Passage of more gas than usual.  Walking can help get rid of the air that was put into your GI tract during the procedure and reduce the bloating. If you had a lower endoscopy (such as a colonoscopy or flexible sigmoidoscopy) you may notice spotting of blood in your stool or on the toilet paper. If you underwent a bowel prep for your procedure, you may not have a normal bowel movement for a few days.  Please Note:  You might notice some irritation and congestion in your nose or some drainage.  This is from the oxygen used during your procedure.  There is no need for concern and it should clear up in a day or so.  SYMPTOMS TO REPORT IMMEDIATELY:   Following lower endoscopy (colonoscopy or flexible sigmoidoscopy):  Excessive amounts of blood in the stool  Significant tenderness or worsening of abdominal pains  Swelling of the abdomen that is new, acute  Fever of 100F or higher   Following upper endoscopy (EGD)  Vomiting of blood or coffee ground material  New chest pain or pain under the shoulder blades  Painful or persistently difficult swallowing  New shortness of breath  Fever of 100F or higher  Black, tarry-looking stools  For urgent or emergent issues, a gastroenterologist can be reached at any hour by calling [phone]. Do not use MyChart messaging for urgent concerns.    DIET:  We do recommend a small meal at first, but  then you may proceed to your regular diet.  Drink plenty of fluids but you should avoid alcoholic beverages for 24 hours.  ACTIVITY:  You should plan to take it easy for the rest of today and you should NOT DRIVE or use heavy machinery until tomorrow (because of the sedation medicines used during the test).    FOLLOW UP: Our staff will call the number listed on your records 48-72 hours following your procedure to check on you and address any questions or concerns that you may have regarding the information given to you following your procedure. If we do not reach you, we will leave a message.  We will attempt to reach you two times.  During this call, we will ask if you have developed any symptoms of COVID 19. If you develop any symptoms (ie: fever, flu-like symptoms, shortness of breath, cough etc.) before then, please call [phone].  If you test positive for Covid 19 in the 2 weeks post procedure, please call and report this information to us.    If any biopsies were taken you will be contacted by phone or by letter within the next 1-3 weeks.  Please call us at [phone] if you have not heard about the biopsies in 3 weeks.    SIGNATURES/CONFIDENTIALITY: You and/or your care partner have signed paperwork which will be entered into your electronic medical record.  These signatures attest to the fact that that the information above on   your After Visit Summary has been reviewed and is understood.  Full responsibility of the confidentiality of this discharge information lies with you and/or your care-partner. 

## 2019-12-21 NOTE — Op Note (Signed)
[facility] Patient Name: Kristen Holt Procedure Date: 12/21/2019 10:11 AM MRN: [id number] Endoscopist: Doristine Locks , MD Age: 44 Referring MD:  Date of Birth: [birth year] Gender: Female Account #: [id number] Procedure:                Colonoscopy Indications:              Generalized abdominal pain, Constipation, Abdominal                            bloating, Normocytic anemia Medicines:                Monitored Anesthesia Care Procedure:                Pre-Anesthesia Assessment:                           - Prior to the procedure, a History and Physical                            was performed, and patient medications and                            allergies were reviewed. The patient's tolerance of                            previous anesthesia was also reviewed. The risks                            and benefits of the procedure and the sedation                            options and risks were discussed with the patient.                            All questions were answered, and informed consent                            was obtained. Prior Anticoagulants: The patient has                            taken no previous anticoagulant or antiplatelet                            agents. ASA Grade Assessment: II - A patient with                            mild systemic disease. After reviewing the risks                            and benefits, the patient was deemed in                            satisfactory condition to undergo the procedure.  After obtaining informed consent, the colonoscope                            was passed under direct vision. Throughout the                            procedure, the patient's blood pressure, pulse, and                            oxygen saturations were monitored continuously. The                            Colonoscope was introduced through the anus and                            advanced to the the  terminal ileum. The colonoscopy                            was performed without difficulty. The patient                            tolerated the procedure well. The quality of the                            bowel preparation was poor. The terminal ileum,                            ileocecal valve, appendiceal orifice, and rectum                            were photographed. Scope In: 10:30:39 AM Scope Out: 10:38:11 AM Scope Withdrawal Time: 0 hours 5 minutes 4 seconds  Total Procedure Duration: 0 hours 7 minutes 32 seconds  Findings:                 The perianal and digital rectal examinations were                            normal.                           A large amount of semi-liquid stool was found in                            the entire colon, precluding visualization. Lavage                            of the area was performed using copious amounts of                            tap water, resulting in incomplete clearance with                            fair visualization.  The terminal ileum appeared normal.                           The retroflexed view of the distal rectum and anal                            verge was normal and showed no anal or rectal                            abnormalities. Complications:            No immediate complications. Estimated Blood Loss:     Estimated blood loss: none. Impression:               - Preparation of the colon was poor.                           - Stool in the entire examined colon. This is exam                            is considered incomplete with significantly limited                            views of the colonic mucosa.                           - The examined portion of the ileum was normal.                           - The distal rectum and anal verge are normal on                            retroflexion view.                           - No specimens collected. Recommendation:           - Patient  has a contact number available for                            emergencies. The signs and symptoms of potential                            delayed complications were discussed with the                            patient. Return to normal activities tomorrow.                            Written discharge instructions were provided to the                            patient.                           - Resume previous diet.                           -  Continue present medications.                           - Repeat colonoscopy at the next available                            appointment because the bowel preparation was poor.                            Plan for extended 2-day prep.                           - Follow-up with Alcide Evener in the GI                            office at appointment to be scheduled following                            repeat colonoscopy. Doristine Locks, MD 12/21/2019 10:48:37 AM

## 2019-12-25 ENCOUNTER — Telehealth: Payer: Self-pay

## 2019-12-25 NOTE — Telephone Encounter (Signed)
  Follow up Call-  Call back number 12/21/2019 12/19/2019  Post procedure Call Back phone  # [phone]  Permission to leave phone message Yes Yes  Some recent data might be hidden     Patient questions:  Do you have a fever, pain , or abdominal swelling? No. Pain Score  0 *  Have you tolerated food without any problems? Yes.    Have you been able to return to your normal activities? Yes.    Do you have any questions about your discharge instructions: Diet   No. Medications  No. Follow up visit  No.  Do you have questions or concerns about your Care? No.  Actions: * If pain score is 4 or above: No action needed, pain <4.  1. Have you developed a fever since your procedure? no  2.   Have you had an respiratory symptoms (SOB or cough) since your procedure? no  3.   Have you tested positive for COVID 19 since your procedure no  4.   Have you had any family members/close contacts diagnosed with the COVID 19 since your procedure?  no   If yes to any of these questions please route to Laverna Peace, RN and Charlett Lango, RN

## 2019-12-25 NOTE — Telephone Encounter (Signed)
Left message on follow up call. 

## 2019-12-27 ENCOUNTER — Telehealth: Payer: Self-pay | Admitting: Nurse Practitioner

## 2019-12-27 ENCOUNTER — Encounter: Payer: Self-pay | Admitting: Gastroenterology

## 2019-12-27 NOTE — Telephone Encounter (Signed)
Pt states that she dropped stool sample on 5/14. She would like to know if results are ready.

## 2020-01-01 ENCOUNTER — Encounter: Payer: Self-pay | Admitting: Gastroenterology

## 2020-01-04 ENCOUNTER — Other Ambulatory Visit: Payer: Self-pay | Admitting: Gastroenterology

## 2020-01-04 DIAGNOSIS — D649 Anemia, unspecified: Secondary | ICD-10-CM

## 2020-01-04 NOTE — Progress Notes (Signed)
[id number] 

## 2020-01-08 ENCOUNTER — Encounter: Payer: Self-pay | Admitting: Gastroenterology

## 2020-01-10 NOTE — Progress Notes (Signed)
Letter in spanish & english sent to Mrs Gwynneth Munson on 01-08-2020.

## 2020-01-17 ENCOUNTER — Encounter (HOSPITAL_COMMUNITY): Payer: Self-pay | Admitting: Emergency Medicine

## 2020-01-17 ENCOUNTER — Ambulatory Visit (HOSPITAL_COMMUNITY)
Admission: EM | Admit: 2020-01-17 | Discharge: 2020-01-17 | Disposition: A | Discharge status: Home / Self Care | Payer: Self-pay | Attending: Family Medicine | Admitting: Family Medicine

## 2020-01-17 DIAGNOSIS — N390 Urinary tract infection, site not specified: Secondary | ICD-10-CM | POA: Insufficient documentation

## 2020-01-17 LAB — POCT URINALYSIS DIP (DEVICE)
Bilirubin Urine: NEGATIVE
Glucose, UA: 100 mg/dL — AB
Ketones, ur: NEGATIVE mg/dL
Nitrite: POSITIVE — AB
Protein, ur: 100 mg/dL — AB
Specific Gravity, Urine: 1.02 (ref 1.005–1.030)
Urobilinogen, UA: 1 mg/dL (ref 0.0–1.0)
pH: 7 (ref 5.0–8.0)

## 2020-01-17 MED ORDER — NITROFURANTOIN MONOHYD MACRO 100 MG PO CAPS
100.0000 mg | ORAL_CAPSULE | Freq: Two times a day (BID) | ORAL | 0 refills | Status: DC
Start: 2020-01-17 — End: 2020-02-14

## 2020-01-17 MED FILL — NITROFURANTOIN MONO-MCR 100: 100 | 5 days supply | Qty: 10 | Fill #0

## 2020-01-17 NOTE — ED Triage Notes (Signed)
Patient presents to urgent care today with symptoms of uti. Symptoms began on one week ago. She states she has urinary frequency and burning.

## 2020-01-17 NOTE — ED Provider Notes (Signed)
[facility]    CSN: [id number] Arrival date & time: 01/17/20  1660      History   Chief Complaint Chief Complaint  Patient presents with  . Urinary Tract Infection    HPI Kristen Holt is a 44 y.o. female.   Pt is overall healthy 44 year old Hispanic female presenting with complaints of pain with urination and increased frequency of urination x 1 week. Symptoms progressively worse. Denies abdominal pain, nausea, vomiting, diarrhea, back pain, vaginal discharge, concern for STDs. No alleviating measures taken.  ROS per HPI      Past Medical History:  Diagnosis Date  . Acute appendicitis 01/13/2012  . Anemia   . COVID-19   . Thyroid disease     Patient Active Problem List   Diagnosis Date Noted  . Anemia 12/15/2019  . Constipation 12/15/2019  . Fatigue 12/15/2019  . Depression 01/01/2017  . Hypothyroid 01/01/2017  . Pap smear for cervical cancer screening 08/21/2013  . Allergic rhinitis 02/01/2013  . Acute appendicitis 01/13/2012    Past Surgical History:  Procedure Laterality Date  . APPENDECTOMY    . LAPAROSCOPIC APPENDECTOMY  01/18/2012   Procedure: APPENDECTOMY LAPAROSCOPIC;  Surgeon: Currie Paris, MD;  Location: [facility];  Service: General;  Laterality: N/A;    OB History    Gravida  5   Para  4   Term  4   Preterm      AB      Living  4     SAB      TAB      Ectopic      Multiple      Live Births               Home Medications    Prior to Admission medications   Medication Sig Start Date End Date Taking? Authorizing Provider  Diaper Rash Products (DESITIN) OINT Apply topically daily as needed.    [provider]  ferrous sulfate 325 (65 FE) MG tablet Take 325 mg by mouth daily with breakfast.    [provider]  nitrofurantoin, macrocrystal-monohydrate, (MACROBID) 100 MG capsule Take 1 capsule (100 mg total) by mouth 2 (two) times daily. 01/17/20   Janace Aris, NP    Family  History Family History  Problem Relation Age of Onset  . Diabetes Mother   . Breast cancer Neg Hx   . Colon cancer Neg Hx   . Esophageal cancer Neg Hx   . Rectal cancer Neg Hx   . Stomach cancer Neg Hx     Social History Social History   Tobacco Use  . Smoking status: Never Smoker  . Smokeless tobacco: Never Used  Vaping Use  . Vaping Use: Never used  Substance Use Topics  . Alcohol use: Yes    Comment: ocassionally   . Drug use: No     Allergies   Patient has no known allergies.   Review of Systems Review of Systems  Constitutional: Positive for fatigue.  Gastrointestinal: Negative for abdominal pain, blood in stool, diarrhea, nausea and vomiting.  Genitourinary: Positive for dysuria, frequency and urgency. Negative for flank pain, hematuria, pelvic pain, vaginal discharge and vaginal pain.  Musculoskeletal: Negative for back pain.     Physical Exam Triage Vital Signs ED Triage Vitals  Enc Vitals Group     BP 01/17/20 0833 (!) 101/57     Pulse Rate 01/17/20 0833 80     Resp 01/17/20 0833 14  Temp 01/17/20 0833 99 F (37.2 C)     Temp Source 01/17/20 0833 Oral     SpO2 01/17/20 0833 100 %     Weight --      Height --      Head Circumference --      Peak Flow --      Pain Score 01/17/20 0831 9     Pain Loc --      Pain Edu? --      Excl. in [place]? --    No data found.  Updated Vital Signs BP (!) 101/57 (BP Location: Left Arm)   Pulse 80   Temp 99 F (37.2 C) (Oral)   Resp 14   LMP 12/21/2019   SpO2 100%   Visual Acuity Right Eye Distance:   Left Eye Distance:   Bilateral Distance:    Right Eye Near:   Left Eye Near:    Bilateral Near:     Physical Exam Constitutional:      General: She is not in acute distress.    Appearance: Normal appearance. She is normal weight.  HENT:     Head: Normocephalic.  Pulmonary:     Effort: Pulmonary effort is normal.  Abdominal:     General: Abdomen is flat. Bowel sounds are normal.     Palpations:  Abdomen is soft.     Tenderness: There is no abdominal tenderness.  Musculoskeletal:        General: Normal range of motion.  Skin:    General: Skin is warm and dry.  Neurological:     Mental Status: She is alert.      UC Treatments / Results  Labs (all labs ordered are listed, but only abnormal results are displayed) Labs Reviewed  POCT URINALYSIS DIP (DEVICE) - Abnormal; Notable for the following components:      Result Value   Glucose, UA 100 (*)    Hgb urine dipstick MODERATE (*)    Protein, ur 100 (*)    Nitrite POSITIVE (*)    Leukocytes,Ua SMALL (*)    All other components within normal limits  URINE CULTURE    EKG   Radiology No results found.  Procedures Procedures (including critical care time)  Medications Ordered in UC Medications - No data to display  Initial Impression / Assessment and Plan / UC Course  I have reviewed the triage vital signs and the nursing notes.  Pertinent labs & imaging results that were available during my care of the patient were reviewed by me and considered in my medical decision making (see chart for details).     UTI Urine with small leuks, positive nitrites, moderate hgb Treating with Macrobid Push fluids Follow up as needed for continued or worsening symptoms  Final Clinical Impressions(s) / UC Diagnoses   Final diagnoses:  Urinary tract infection without hematuria, site unspecified     Discharge Instructions     Treating you for a UTI Take the medication as prescribed Push fluids Follow up as needed for continued or worsening symptoms     ED Prescriptions    Medication Sig Dispense Auth. Provider   nitrofurantoin, macrocrystal-monohydrate, (MACROBID) 100 MG capsule Take 1 capsule (100 mg total) by mouth 2 (two) times daily. 10 capsule Loura Halt A, NP     PDMP not reviewed this encounter.   Loura Halt A, NP 01/17/20 [phone]

## 2020-01-17 NOTE — Discharge Instructions (Addendum)
Treating you for a UTI Take the medication as prescribed Push fluids Follow up as needed for continued or worsening symptoms

## 2020-01-19 LAB — URINE CULTURE: Culture: >=100000 — AB

## 2020-01-31 ENCOUNTER — Encounter (HOSPITAL_COMMUNITY): Payer: Self-pay

## 2020-01-31 ENCOUNTER — Other Ambulatory Visit: Payer: Self-pay

## 2020-01-31 ENCOUNTER — Ambulatory Visit (HOSPITAL_COMMUNITY)
Admission: EM | Admit: 2020-01-31 | Discharge: 2020-01-31 | Disposition: A | Discharge status: Home / Self Care | Payer: Self-pay | Attending: Family Medicine | Admitting: Family Medicine

## 2020-01-31 DIAGNOSIS — N390 Urinary tract infection, site not specified: Secondary | ICD-10-CM

## 2020-01-31 DIAGNOSIS — R300 Dysuria: Secondary | ICD-10-CM

## 2020-01-31 LAB — POCT URINALYSIS DIP (DEVICE)
Bilirubin Urine: NEGATIVE
Glucose, UA: NEGATIVE mg/dL
Hgb urine dipstick: NEGATIVE
Ketones, ur: NEGATIVE mg/dL
Nitrite: NEGATIVE
Protein, ur: NEGATIVE mg/dL
Specific Gravity, Urine: 1.02 (ref 1.005–1.030)
Urobilinogen, UA: 0.2 mg/dL (ref 0.0–1.0)
pH: 6.5 (ref 5.0–8.0)

## 2020-01-31 MED ORDER — PHENAZOPYRIDINE HCL 200 MG PO TABS
200.0000 mg | ORAL_TABLET | Freq: Three times a day (TID) | ORAL | 0 refills | Status: DC
Start: 2020-01-31 — End: 2020-12-01

## 2020-01-31 MED ORDER — METHOCARBAMOL 500 MG PO TABS
500.0000 mg | ORAL_TABLET | Freq: Two times a day (BID) | ORAL | 0 refills | Status: DC
Start: 2020-01-31 — End: 2020-03-20

## 2020-01-31 MED FILL — PHENAZOPYRIDINE 100 MG TAB: 100 | 2 days supply | Qty: 12 | Fill #0

## 2020-01-31 MED FILL — METHOCARBAMOL 500 MG TABS: 500 | 10 days supply | Qty: 20 | Fill #0

## 2020-01-31 NOTE — ED Provider Notes (Signed)
[facility]   CC: UTI  SUBJECTIVE:  Kristen Holt is a 44 y.o. female who complains of urinary frequency, urgency and dysuria for the past week and a half.  Reports that she just finished antibiotics for UTI about 3 days ago.  Reports that she still having urinary frequency and dysuria. admits to similar symptoms in the past.  Denies fever, chills, nausea, vomiting, abdominal pain, flank pain, abnormal vaginal discharge or bleeding, hematuria.    LMP: No LMP recorded.  ROS: As in HPI.  All other pertinent ROS negative.     Past Medical History:  Diagnosis Date  . Acute appendicitis 01/13/2012  . Anemia   . COVID-19   . Thyroid disease    Past Surgical History:  Procedure Laterality Date  . APPENDECTOMY    . LAPAROSCOPIC APPENDECTOMY  01/18/2012   Procedure: APPENDECTOMY LAPAROSCOPIC;  Surgeon: Currie Paris, MD;  Location: [facility] OR;  Service: General;  Laterality: N/A;   No Known Allergies No current facility-administered medications on file prior to encounter.   Current Outpatient Medications on File Prior to Encounter  Medication Sig Dispense Refill  . Diaper Rash Products (DESITIN) OINT Apply topically daily as needed.    . ferrous sulfate 325 (65 FE) MG tablet Take 325 mg by mouth daily with breakfast.    . nitrofurantoin, macrocrystal-monohydrate, (MACROBID) 100 MG capsule Take 1 capsule (100 mg total) by mouth 2 (two) times daily. 10 capsule 0   Social History   Socioeconomic History  . Marital status: Single    Spouse name: Not on file  . Number of children: Not on file  . Years of education: Not on file  . Highest education level: Not on file  Occupational History  . Not on file  Tobacco Use  . Smoking status: Never Smoker  . Smokeless tobacco: Never Used  Vaping Use  . Vaping Use: Never used  Substance and Sexual Activity  . Alcohol use: Yes    Comment: ocassionally   . Drug use: No  . Sexual activity: Yes    Partners: Male    Other Topics Concern  . Not on file  Social History Narrative   ** Merged History Encounter **       Social Determinants of Health   Financial Resource Strain:   . Difficulty of Paying Living Expenses:   Food Insecurity:   . Worried About Programme researcher, broadcasting/film/video in the Last Year:   . Barista in the Last Year:   Transportation Needs:   . Freight forwarder (Medical):   Marland Kitchen Lack of Transportation (Non-Medical):   Physical Activity:   . Days of Exercise per Week:   . Minutes of Exercise per Session:   Stress:   . Feeling of Stress :   Social Connections:   . Frequency of Communication with Friends and Family:   . Frequency of Social Gatherings with Friends and Family:   . Attends Religious Services:   . Active Member of Clubs or Organizations:   . Attends Banker Meetings:   Marland Kitchen Marital Status:   Intimate Partner Violence:   . Fear of Current or Ex-Partner:   . Emotionally Abused:   Marland Kitchen Physically Abused:   . Sexually Abused:    Family History  Problem Relation Age of Onset  . Diabetes Mother   . Breast cancer Neg Hx   . Colon cancer Neg Hx   . Esophageal cancer Neg Hx   . Rectal  cancer Neg Hx   . Stomach cancer Neg Hx     OBJECTIVE:  Vitals:   01/31/20 1242 01/31/20 1247  BP: 107/63   Pulse: 83   Resp: 16   Temp: 97.7 F (36.5 C)   TempSrc: Oral   SpO2: 100%   Weight:  135 lb (61.2 kg)  Height:  5\' 2"  (1.575 m)   General appearance: AOx3 in no acute distress HEENT: NCAT.  Oropharynx clear.  Lungs: clear to auscultation bilaterally without adventitious breath sounds Heart: regular rate and rhythm.  Radial pulses 2+ symmetrical bilaterally Abdomen: soft; non-distended; no tenderness; bowel sounds present; no guarding or rebound tenderness Back: no CVA tenderness Extremities: no edema; symmetrical with no gross deformities Skin: warm and dry Neurologic: Ambulates from chair to exam table without difficulty Psychological: alert and  cooperative; normal mood and affect  Labs Reviewed  POCT URINALYSIS DIP (DEVICE) - Abnormal; Notable for the following components:      Result Value   Leukocytes,Ua TRACE (*)    All other components within normal limits    ASSESSMENT & PLAN:  1. Dysuria     Meds ordered this encounter  Medications  . phenazopyridine (PYRIDIUM) 200 MG tablet    Sig: Take 1 tablet (200 mg total) by mouth 3 (three) times daily.    Dispense:  6 tablet    Refill:  0    Order Specific Question:   Supervising Provider    Answer:   Merrilee Jansky  . methocarbamol (ROBAXIN) 500 MG tablet    Sig: Take 1 tablet (500 mg total) by mouth 2 (two) times daily.    Dispense:  20 tablet    Refill:  0    Order Specific Question:   Supervising Provider    Answer:   [id number] Merrilee Jansky   Prescribed Pyridium Prescribed Robaxin Urine culture sent.  We will call you with abnormal results that need further treatment.   Push fluids and get plenty of rest.   Take antibiotic as directed and to completion Take pyridium as prescribed and as needed for symptomatic relief Follow up with PCP if symptoms persists Return here or go to ER if you have any new or worsening symptoms such as fever, worsening abdominal pain, nausea/vomiting, flank pain  Outlined signs and symptoms indicating need for more acute intervention. Patient verbalized understanding. After Visit Summary given.      [id number], NP 01/31/20 1334

## 2020-01-31 NOTE — Discharge Instructions (Signed)
I have sent in Pyridium for you to take 3 times a day as needed for painful urination  I have also sent in Robaxin for you to take for muscle spasms that are likely happening when you are urinating  If you are not feeling better over the next couple of days, follow-up with this office or with primary care  We are to culture your urine and if there is anything that grows, we will be in touch with you and we will treat accordingly

## 2020-01-31 NOTE — ED Triage Notes (Signed)
Pt c/o dysuria, feels like bladder constantly full, urinary frequencyx3 wks. Pt had atx a wk or so ago for UTI symptoms, but soon as she finished atx, symptoms started again.

## 2020-01-31 NOTE — ED Notes (Signed)
I used ipad interpreter to triage pt 

## 2020-02-12 ENCOUNTER — Encounter: Payer: Self-pay | Admitting: Gastroenterology

## 2020-02-14 ENCOUNTER — Other Ambulatory Visit: Payer: Self-pay

## 2020-02-14 ENCOUNTER — Ambulatory Visit: Payer: Self-pay | Attending: Physician Assistant | Admitting: Physician Assistant

## 2020-02-14 ENCOUNTER — Encounter: Payer: Self-pay | Admitting: Physician Assistant

## 2020-02-14 VITALS — BP 113/72 | HR 80 | Temp 97.7°F | Wt 147.0 lb

## 2020-02-14 DIAGNOSIS — N390 Urinary tract infection, site not specified: Secondary | ICD-10-CM

## 2020-02-14 DIAGNOSIS — Z789 Other specified health status: Secondary | ICD-10-CM

## 2020-02-14 DIAGNOSIS — Z758 Other problems related to medical facilities and other health care: Secondary | ICD-10-CM

## 2020-02-14 DIAGNOSIS — R350 Frequency of micturition: Secondary | ICD-10-CM

## 2020-02-14 DIAGNOSIS — Z09 Encounter for follow-up examination after completed treatment for conditions other than malignant neoplasm: Secondary | ICD-10-CM

## 2020-02-14 LAB — POCT URINALYSIS DIP (CLINITEK)
Bilirubin, UA: NEGATIVE
Blood, UA: NEGATIVE
Glucose, UA: NEGATIVE mg/dL
Ketones, POC UA: NEGATIVE mg/dL
Nitrite, UA: NEGATIVE
POC PROTEIN,UA: NEGATIVE
Spec Grav, UA: 1.02 (ref 1.010–1.025)
Urobilinogen, UA: 0.2 E.U./dL
pH, UA: 8 (ref 5.0–8.0)

## 2020-02-14 LAB — POCT URINE PREGNANCY: Preg Test, Ur: NEGATIVE

## 2020-02-14 MED ORDER — FLUCONAZOLE 150 MG PO TABS: 150.0000 mg | ORAL_TABLET | Freq: Once | ORAL | 0 refills | Status: AC

## 2020-02-14 MED ORDER — NITROFURANTOIN MONOHYD MACRO 100 MG PO CAPS: 100.0000 mg | ORAL_CAPSULE | Freq: Two times a day (BID) | ORAL | 0 refills | Status: DC

## 2020-02-14 MED FILL — NITROFURANTOIN MONO-MCR 100: 100 | 5 days supply | Qty: 10 | Fill #0

## 2020-02-14 MED FILL — FLUCONAZOLE 150 MG TABLET: 150 | 1 days supply | Qty: 1 | Fill #0

## 2020-02-14 NOTE — Patient Instructions (Signed)
Drink 80-100 ounces water daily   Infeccin urinaria en los adultos Urinary Tract Infection, Adult Una infeccin urinaria (IU) puede ocurrir en Corporate treasurer de las vas [place]. Las vas urinarias incluyen lo siguiente:  Los riones.  Los urteres.  La vejiga.  La uretra. Estos rganos fabrican, almacenan y eliminan el pis (orina) del cuerpo. Cules son las causas? La causa es la presencia de grmenes (bacterias) en la zona genital. Estos grmenes proliferan y causan hinchazn (inflamacin) de las vas urinarias. Qu incrementa el riesgo? Es ms probable que contraiga esta afeccin si:  Tiene colocado un tubo delgado y pequeo (catter) para drenar el pis.  Nopuede controlar la evacuacin de pis ni de materia fecal (incontinencia).  Es Nurse, learning disability y, adems: ? [place] estos mtodos para Location manager:  Un medicamento que [employer] espermatozoides (espermicida).  Un dispositivo que impide el paso de los espermatozoides (diafragma). ? Tieneniveles bajos de una hormona femenina (estrgeno). ? Est embarazada.  Tiene genes que [employer].  Es sexualmente activa.  Toma antibiticos.  Tiene dificultad para orinar debido a: ? Su prstata es ms grande de lo normal, si usted es hombre. ? Obstruccin en la parte del cuerpo que drena el pis de la vejiga (uretra). ? Clculo renal. ? Untrastorno nervioso que afecta la vejiga (vejiga neurgena). ? No bebe una cantidad suficiente de lquido. ? No hace pis con la frecuencia suficiente.  Tiene otras afecciones, como: ? Diabetes. ? Un sistema que combate las enfermedades (sistema inmunitario) debilitado. ? Anemia drepanoctica. ? Gota. ? Lesin en la columna vertebral. Cules son los signos o los sntomas? Los sntomas de esta afeccin incluyen:  Necesidad inmediata (urgente) de hacer pis.  Hacer pis con frecuencia.  Hacer poca cantidad de pis con mucha frecuencia.  Dolor o ardor al [employer].  Sangre en el  pis.  Pis que huele mal o anormal.  Dificultad para hacer pis.  Pis turbio.  Lquido que sale de la vagina, si es [place].  Dolor en la barriga o en la parte baja de la espalda. Otros sntomas pueden incluir los siguientes:  Vmitos.  No sentir deseos de comer.  Sentirse confundido (confuso).  Sentirse cansado y malhumorado (irritable).  Grant Ruts.  Materia fecal lquida (diarrea). Cmo se trata? El tratamiento de esta afeccin puede incluir:  Antibiticos.  Otros medicamentos.  Beber una cantidad suficiente de agua. Sigue estas instrucciones en tu casa:  Medicamentos  [employer] de venta libre y los recetados solamente como se lo haya indicado el mdico.  Si le recetaron un antibitico, tmelo como se lo haya indicado el mdico. No deje de tomarlo aunque comience a sentirse mejor. Indicaciones generales  Asegrese de hacer lo siguiente: ? Haga pis hasta que la vejiga quede vaca. ? Nocontenga el pis durante mucho tiempo. ? Vace la vejiga despus de Management consultant. ? Lmpiese de adelante hacia atrs despus de defecar, si es mujer. Use cada trozo de papel higinico solo una vez cuando se limpie.  Beba suficiente lquido como para Pharmacologist la orina de color amarillo plido.  Concurra a todas las visitas de [address] se lo haya indicado el mdico. Esto es importante. Comunquese con un mdico si:  No mejora despus de 1 o 2das de tratamiento.  Los sntomas desaparecen y Stage manager. Solicite ayuda inmediatamente si:  Tiene un dolor muy intenso en la espalda.  Tiene dolor muy intenso en la parte baja de la barriga.  Tener fiebre.  Tiene Programme researcher, broadcasting/film/video (nuseas).  Tiene vmitos. Resumen  Una infeccin urinaria (IU) puede ocurrir en Corporate treasurer de las vas [place].  Esta afeccin es causada por la presencia de grmenes en la zona genital.  Existen muchos factores de riesgo de sufrir una IU. Estos incluyen  tener colocado un tubo delgado y pequeo para drenar el pis y no poder controlar cundo hace pis y materia fecal.  El tratamiento incluye antibiticos contra los grmenes.  Beba suficiente lquido como para Pharmacologist la orina de color [place]arillo plido. Esta informacin no tiene Theme park manager el consejo del mdico. Asegrese de hacerle al mdico cualquier pregunta que tenga. Document Revised: 07/12/2018 Document Reviewed: 07/12/2018 Elsevier Patient Education  2020 [employer].

## 2020-02-14 NOTE — Progress Notes (Signed)
Kristen Holt, is a 44 y.o. female  TFT:[id number]  HCW:[id number]  DOB - [birth year]  Subjective:  Chief Complaint and HPI: Kristen Holt is a 44 y.o. female here today for a follow up visit after 2 recent UC visits for UTI.  Once urine was cultured and showed sensitivity to macrobid and was treated.  Patient has continued to experience urinary frequency and discomfort at times.  No fever.  No pelvic pain.  No abdominal pain.  No vaginal discharge, no flank pain  She is sexually active and monogamous.  Had failed condom and took emergency contraception about 10 days ago.  Some nausea since that.    Kristen Holt with [employer] interpreters translating  ED/Hospital notes reviewed.    ROS:   Constitutional:  No f/c, No night sweats, No unexplained weight loss. EENT:  No vision changes, No blurry vision, No hearing changes. No mouth, throat, or ear problems.  Respiratory: No cough, No SOB Cardiac: No CP, no palpitations GI:  No abd pain, No N/V/D. GU: see above Musculoskeletal: No joint pain Neuro: No headache, no dizziness, no motor weakness.  Skin: No rash Endocrine:  No polydipsia. No polyuria.  Psych: Denies SI/HI  No problems updated.  ALLERGIES: No Known Allergies  PAST MEDICAL HISTORY: Past Medical History:  Diagnosis Date  . Acute appendicitis 01/13/2012  . Anemia   . COVID-19   . Thyroid disease     MEDICATIONS AT HOME: Prior to Admission medications   Medication Sig Start Date End Date Taking? Authorizing Provider  Diaper Rash Products (DESITIN) OINT Apply topically daily as needed.   Yes [provider]  ferrous sulfate 325 (65 FE) MG tablet Take 325 mg by mouth daily with breakfast.   Yes [provider]  methocarbamol (ROBAXIN) 500 MG tablet Take 1 tablet (500 mg total) by mouth 2 (two) times daily. 01/31/20  Yes Moshe Cipro, NP  nitrofurantoin, macrocrystal-monohydrate, (MACROBID) 100 MG capsule Take 1 capsule (100 mg  total) by mouth 2 (two) times daily. 02/14/20  Yes Anders Simmonds, PA-C  phenazopyridine (PYRIDIUM) 200 MG tablet Take 1 tablet (200 mg total) by mouth 3 (three) times daily. 01/31/20  Yes Moshe Cipro, NP  fluconazole (DIFLUCAN) 150 MG tablet Take 1 tablet (150 mg total) by mouth once for 1 dose. If needed for yeast infection 02/14/20 02/14/20  Anders Simmonds, PA-C     Objective:  EXAM:   Vitals:   02/14/20 1428  BP: 113/72  Pulse: 80  Temp: 97.7 F (36.5 C)  TempSrc: Temporal  SpO2: 94%  Weight: 147 lb (66.7 kg)    General appearance : A&OX3. NAD. Non-toxic-appearing HEENT: Atraumatic and Normocephalic.  PERRLA. EOM intact.   Chest/Lungs:  Breathing-non-labored, Good air entry bilaterally, breath sounds normal without rales, rhonchi, or wheezing  CVS: S1 S2 regular, no murmurs, gallops, rubs  No CVATTP Extremities: Bilateral Lower Ext shows no edema, both legs are warm to touch with = pulse throughout Neurology:  CN II-XII grossly intact, Non focal.   Psych:  TP linear. J/I WNL. Normal speech. Appropriate eye contact and affect.  Skin:  No Rash  Data Review Lab Results  Component Value Date   HGBA1C 5.1 03/03/2017   HGBA1C 5.5 11/23/2016     Assessment & Plan   1. Urinary tract infection without hematuria, site unspecified Recurrent.  Urinary hygiene reviewed.  Increase water intake - POCT URINALYSIS DIP (CLINITEK) - Urine Culture - nitrofurantoin, macrocrystal-monohydrate, (MACROBID) 100 MG capsule; Take 1 capsule (100 mg  total) by mouth 2 (two) times daily.  Dispense: 10 capsule; Refill: 0 - Ambulatory referral to Urology - fluconazole (DIFLUCAN) 150 MG tablet; Take 1 tablet (150 mg total) by mouth once for 1 dose. If needed for yeast infection  Dispense: 1 tablet; Refill: 0  2. Uses emergency contraception - POCT urine pregnancy  3. Frequency of urination - Ambulatory referral to Urology  4. Encounter for examination following treatment at  hospital  5. Language barrier [employer] interpreters used and additional time performing visit was required.   Patient have been counseled extensively about nutrition and exercise  Return if symptoms worsen or fail to improve.  The patient was given clear instructions to go to ER or return to medical center if symptoms don't improve, worsen or new problems develop. The patient verbalized understanding. The patient was told to call to get lab results if they haven't heard anything in the next week.     Kristen Co, PA-C [facility] [place], [place] [phone]   02/14/2020, 2:59 PMPatient ID: Kristen Holt, female   DOB: 03-07-1976, 44 y.o.   MRN: [id number]

## 2020-02-16 LAB — URINE CULTURE

## 2020-02-26 ENCOUNTER — Other Ambulatory Visit: Payer: Self-pay

## 2020-02-26 ENCOUNTER — Ambulatory Visit (AMBULATORY_SURGERY_CENTER): Payer: Self-pay | Admitting: *Deleted

## 2020-02-26 VITALS — Ht 61.0 in | Wt 142.0 lb

## 2020-02-26 DIAGNOSIS — K5900 Constipation, unspecified: Secondary | ICD-10-CM

## 2020-02-26 DIAGNOSIS — Z01818 Encounter for other preprocedural examination: Secondary | ICD-10-CM

## 2020-02-26 DIAGNOSIS — D649 Anemia, unspecified: Secondary | ICD-10-CM

## 2020-02-26 MED ORDER — POLYETHYLENE GLYCOL 3350 17 GM/SCOOP PO POWD: 1.0000 | Freq: Every day | ORAL | 0 refills | Status: DC

## 2020-02-26 MED ORDER — NA SULFATE-K SULFATE-MG SULF 17.5-3.13-1.6 GM/177ML PO SOLN
1.0000 | Freq: Once | ORAL | 0 refills | Status: AC
Start: 2020-02-26 — End: 2020-02-26

## 2020-02-26 MED ORDER — POLYETHYLENE GLYCOL 3350 17 GM/SCOOP PO POWD: 1.0000 | Freq: Once | ORAL | Status: DC

## 2020-02-26 MED FILL — SUPREP BOWEL PREP KIT: 17.5-3.13-1 | 30 days supply | Qty: 354 | Fill #0

## 2020-02-26 NOTE — Progress Notes (Signed)
No egg or soy allergy known to patient  No issues with past sedation with any surgeries or procedures no intubation problems in the past  No diet pills per patient No home 02 use per patient  No blood thinners per patient  Pt denies issues with constipation  No A fib or A flutter  EMMI video to pt or MyChart  COVID 19 guidelines implemented in [facility] to today   Coupon given to pt in [facility] today   Due to the COVID-19 pandemic we are asking patients to follow these guidelines. Please only bring one care partner. Please be aware that your care partner may wait in the car in the parking lot or if they feel like they will be too hot to wait in the car, they may wait in the lobby on the 4th floor. All care partners are required to wear a mask the entire time (we do not have any that we can provide them), they need to practice social distancing, and we will do a Covid check for all patient's and care partners when you arrive. Also we will check their temperature and your temperature. If the care partner waits in their car they need to stay in the parking lot the entire time and we will call them on their cell phone when the patient is ready for discharge so they can bring the car to the front of the building. Also all patient's will need to wear a mask into building.

## 2020-02-27 MED FILL — POLYETHYLENE GLYCOL 3350 PO: 17 | 1 days supply | Qty: 238 | Fill #0

## 2020-02-29 ENCOUNTER — Ambulatory Visit: Payer: Self-pay | Admitting: Internal Medicine

## 2020-03-19 ENCOUNTER — Ambulatory Visit (INDEPENDENT_AMBULATORY_CARE_PROVIDER_SITE_OTHER): Payer: Self-pay

## 2020-03-19 DIAGNOSIS — Z1159 Encounter for screening for other viral diseases: Secondary | ICD-10-CM

## 2020-03-20 ENCOUNTER — Other Ambulatory Visit: Payer: Self-pay

## 2020-03-20 ENCOUNTER — Encounter: Payer: Self-pay | Admitting: Gastroenterology

## 2020-03-20 ENCOUNTER — Ambulatory Visit (AMBULATORY_SURGERY_CENTER): Payer: Self-pay | Admitting: Gastroenterology

## 2020-03-20 VITALS — BP 115/69 | HR 55 | Temp 98.2°F | Resp 17 | Ht 61.0 in | Wt 142.0 lb

## 2020-03-20 DIAGNOSIS — D649 Anemia, unspecified: Secondary | ICD-10-CM

## 2020-03-20 DIAGNOSIS — R140 Abdominal distension (gaseous): Secondary | ICD-10-CM

## 2020-03-20 DIAGNOSIS — K635 Polyp of colon: Secondary | ICD-10-CM

## 2020-03-20 DIAGNOSIS — K5900 Constipation, unspecified: Secondary | ICD-10-CM

## 2020-03-20 MED ORDER — SODIUM CHLORIDE 0.9 % IV SOLN: 500.0000 mL | Freq: Once | INTRAVENOUS | Status: DC

## 2020-03-20 NOTE — Op Note (Signed)
[facility] Patient Name: Kristen Holt Procedure Date: 03/20/2020 8:52 AM MRN: [id number] Endoscopist: Doristine Locks , MD Age: 44 Referring MD:  Date of Birth: 1976/04/13 Gender: Female Account #: [id number] Procedure:                Colonoscopy Indications:              Generalized abdominal pain, Constipation, Bloating,                            Normocytic anemia                           Colonoscopy in 12/2019 with retained stool, with                            recommendation for short interval repeat                            colonoscopy with extended 2-day prep. Medicines:                Monitored Anesthesia Care Procedure:                Pre-Anesthesia Assessment:                           - Prior to the procedure, a History and Physical                            was performed, and patient medications and                            allergies were reviewed. The patient's tolerance of                            previous anesthesia was also reviewed. The risks                            and benefits of the procedure and the sedation                            options and risks were discussed with the patient.                            All questions were answered, and informed consent                            was obtained. Prior Anticoagulants: The patient has                            taken no previous anticoagulant or antiplatelet                            agents. ASA Grade Assessment: II - A patient with  mild systemic disease. After reviewing the risks                            and benefits, the patient was deemed in                            satisfactory condition to undergo the procedure.                           After obtaining informed consent, the colonoscope                            was passed under direct vision. Throughout the                            procedure, the patient's blood pressure, pulse, and                             oxygen saturations were monitored continuously. The                            Colonoscope was introduced through the anus and                            advanced to the the terminal ileum. The colonoscopy                            was performed without difficulty. The patient                            tolerated the procedure well. The quality of the                            bowel preparation was excellent. The terminal                            ileum, ileocecal valve, appendiceal orifice, and                            rectum were photographed. Scope In: 8:59:54 AM Scope Out: 9:09:57 AM Scope Withdrawal Time: 0 hours 8 minutes 15 seconds  Total Procedure Duration: 0 hours 10 minutes 3 seconds  Findings:                 The perianal and digital rectal examinations were                            normal.                           Localized mild inflammation characterized by                            congestion (edema) and erythema was found in the  distal sigmoid colon, located 20-25 cm from the                            anal verge. No associated ulcers, erosions,                            aphthae, or diverticula. Biopsies were taken with a                            cold forceps for histology. Estimated blood loss                            was minimal.                           The exam was otherwise normal throughout the                            remainder of the colon.                           The retroflexed view of the distal rectum and anal                            verge was normal and showed no anal or rectal                            abnormalities.                           The terminal ileum appeared normal. Complications:            No immediate complications. Estimated Blood Loss:     Estimated blood loss was minimal. Impression:               - Localized mild inflammation was found in the                             distal sigmoid colon. Biopsied.                           - The distal rectum and anal verge are normal on                            retroflexion view.                           - The examined portion of the ileum was normal. Recommendation:           - Patient has a contact number available for                            emergencies. The signs and symptoms of potential                            delayed complications were discussed with  the                            patient. Return to normal activities tomorrow.                            Written discharge instructions were provided to the                            patient.                           - Resume previous diet.                           - Continue present medications.                           - Await pathology results.                           - Repeat colonoscopy in 10 years for screening                            purposes.                           - Return to GI office PRN.                           - Use fiber, for example Citrucel, Fibercon, Konsyl                            or Metamucil. Doristine Locks, MD 03/20/2020 9:15:16 AM

## 2020-03-20 NOTE — Progress Notes (Signed)
Called to room to assist during endoscopic procedure.  Patient ID and intended procedure confirmed with present staff. Received instructions for my participation in the procedure from the performing physician.  

## 2020-03-20 NOTE — Progress Notes (Signed)
A and O x3. Report to RN. Tolerated MAC anesthesia well.

## 2020-03-20 NOTE — Patient Instructions (Signed)
USTED TUVO UN PROCEDIMIENTO ENDOSCPICO HOY EN [facility]:   Lea el informe del procedimiento que se le entreg para cualquier pregunta especfica sobre lo que se encontr durante su examen.  Si el informe del examen no responde a sus preguntas, por favor llame a su gastroenterlogo para aclararlo.  Si usted solicit que no se le den detalles de lo que se encontr en su procedimiento al acompaante que le va a cuidar, entonces el informe del procedimiento se ha incluido en un sobre sellado para que usted lo revise despus cuando le sea ms conveniente.   LO QUE PUEDE ESPERAR: Algunas sensaciones de hinchazn en el abdomen.  Puede tener ms gases de lo normal.  El caminar puede ayudarle a eliminar el aire que se le puso en el tracto gastrointestinal durante el procedimiento y reducir la hinchazn.  Si le hicieron una endoscopia inferior (como una colonoscopia o una sigmoidoscopia flexible), podra notar manchas de sangre en las heces fecales o en el papel higinico.  Si se someti a una preparacin intestinal para su procedimiento, es posible que no tenga una evacuacin intestinal normal durante algunos das.   Tenga en cuenta:  Es posible que note un poco de irritacin y congestin en la nariz o algn drenaje.  Esto es debido al oxgeno utilizado durante su procedimiento.  No hay que preocuparse y esto debe desaparecer ms o menos en un da.   SNTOMAS PARA REPORTAR INMEDIATAMENTE:  Despus de una endoscopia inferior (colonoscopia o sigmoidoscopia flexible):  Cantidades excesivas de sangre en las heces fecales  Sensibilidad significativa o empeoramiento de los dolores abdominales   Hinchazn aguda del abdomen que antes no tena   Fiebre de 100F o ms    Para asuntos urgentes o de emergencia, puede comunicarse con un gastroenterlogo a cualquier hora llamando al [phone].  DIETA:  Recomendamos una comida pequea al principio, pero luego puede continuar con su dieta normal.   Tome muchos lquidos, pero debe evitar las bebidas alcohlicas durante 24 horas.    ACTIVIDAD:  Debe planear tomarse las cosas con calma por el resto del da y no debe CONDUCIR ni usar maquinaria pesada hasta maana (debido a los medicamentos de sedacin utilizados durante el examen).     SEGUIMIENTO: Nuestro personal llamar al nmero que aparece en su historial al siguiente da hbil de su procedimiento para ver cmo se siente y para responder cualquier pregunta o inquietud que pueda tener con respecto a la informacin que se le dio despus del procedimiento. Si no podemos contactarle, le dejaremos un mensaje.  Sin embargo, si se siente bien y no tiene ningn problema, no es necesario que nos devuelva la llamada.  Asumiremos que ha regresado a sus actividades diarias normales sin incidentes. Si se le tomaron algunas biopsias, le contactaremos por telfono o por carta en las prximas 3 semanas.  Si no ha sabido nada sobre las biopsias en el transcurso de 3 semanas, por favor llmenos al [phone].   FIRMAS/CONFIDENCIALIDAD: Usted y/o el acompaante que le cuide han firmado documentos que se ingresarn en su historial mdico electrnico.  Estas firmas atestiguan el hecho de que la informacin anterior  

## 2020-03-20 NOTE — Progress Notes (Signed)
Pt's states no medical or surgical changes since previsit or office visit.  Vitals- Courtney 

## 2020-03-24 ENCOUNTER — Telehealth: Payer: Self-pay | Admitting: *Deleted

## 2020-03-24 ENCOUNTER — Encounter: Payer: Self-pay | Admitting: Gastroenterology

## 2020-03-24 NOTE — Telephone Encounter (Signed)
No answer for post procedure call back. Left message for patient to call with questions or concerns. 

## 2020-03-24 NOTE — Telephone Encounter (Signed)
Attempted f/u phone call. No answer. Left message. °

## 2020-04-03 ENCOUNTER — Ambulatory Visit (HOSPITAL_COMMUNITY)
Admission: EM | Admit: 2020-04-03 | Discharge: 2020-04-03 | Disposition: A | Discharge status: Home / Self Care | Payer: Self-pay | Attending: Family Medicine | Admitting: Family Medicine

## 2020-04-03 ENCOUNTER — Encounter (HOSPITAL_COMMUNITY): Payer: Self-pay

## 2020-04-03 ENCOUNTER — Other Ambulatory Visit: Payer: Self-pay

## 2020-04-03 DIAGNOSIS — N760 Acute vaginitis: Secondary | ICD-10-CM

## 2020-04-03 LAB — POCT URINALYSIS DIPSTICK, ED / UC
Bilirubin Urine: NEGATIVE
Glucose, UA: NEGATIVE mg/dL
Hgb urine dipstick: NEGATIVE
Ketones, ur: NEGATIVE mg/dL
Leukocytes,Ua: NEGATIVE
Nitrite: NEGATIVE
Protein, ur: NEGATIVE mg/dL
Specific Gravity, Urine: 1.02 (ref 1.005–1.030)
Urobilinogen, UA: 0.2 mg/dL (ref 0.0–1.0)
pH: 7.5 (ref 5.0–8.0)

## 2020-04-03 MED ORDER — NYSTATIN-TRIAMCINOLONE 100000-0.1 UNIT/GM-% EX CREA
TOPICAL_CREAM | CUTANEOUS | 0 refills | Status: DC
Start: 2020-04-03 — End: 2020-12-01

## 2020-04-03 MED ORDER — FLUCONAZOLE 150 MG PO TABS
150.0000 mg | ORAL_TABLET | Freq: Every day | ORAL | 0 refills | Status: DC
Start: 2020-04-03 — End: 2020-12-01

## 2020-04-03 MED FILL — FLUCONAZOLE 150 MG TABLET: 150 | 2 days supply | Qty: 2 | Fill #0

## 2020-04-03 MED FILL — NYSTATIN-TRIAMCINOLONE CRM: 100000-0.1 | 7 days supply | Qty: 15 | Fill #0

## 2020-04-03 NOTE — Discharge Instructions (Addendum)
Treating you for a yeast infection. Take the medication as prescribed. Urine normal Follow up as needed for continued or worsening symptoms

## 2020-04-03 NOTE — ED Triage Notes (Signed)
Pt presents with complaints of vaginal itching x 1 week. Denies any other symptoms. Pt denies any concern for STD.

## 2020-04-03 NOTE — ED Provider Notes (Signed)
[facility]    CSN: [id number] Arrival date & time: 04/03/20  7062      History   Chief Complaint Chief Complaint  Patient presents with  . Vaginitis    HPI Kristen Holt is a 44 y.o. female.   Patient is a 44 year old female who presents today with vaginal itching, irritation and swelling x1 week.  Symptoms have been constant.  Denies any specific discharge or odor.  Denies any abdominal pain, dysuria, hematuria or urinary frequency.  No concern for STDs at this time. Patient's last menstrual period was 03/16/2020.      Past Medical History:  Diagnosis Date  . Acute appendicitis 01/13/2012  . Anemia   . COVID-19   . Recurrent UTI   . Thyroid disease     Patient Active Problem List   Diagnosis Date Noted  . Anemia 12/15/2019  . Constipation 12/15/2019  . Fatigue 12/15/2019  . Depression 01/01/2017  . Hypothyroid 01/01/2017  . Pap smear for cervical cancer screening 08/21/2013  . Allergic rhinitis 02/01/2013  . Acute appendicitis 01/13/2012    Past Surgical History:  Procedure Laterality Date  . APPENDECTOMY    . COLONOSCOPY    . LAPAROSCOPIC APPENDECTOMY  01/18/2012   Procedure: APPENDECTOMY LAPAROSCOPIC;  Surgeon: Currie Paris, MD;  Location: [facility] OR;  Service: General;  Laterality: N/A;    OB History    Gravida  5   Para  4   Term  4   Preterm      AB      Living  4     SAB      TAB      Ectopic      Multiple      Live Births               Home Medications    Prior to Admission medications   Medication Sig Start Date End Date Taking? Authorizing Provider  ferrous sulfate 325 (65 FE) MG tablet Take 325 mg by mouth daily with breakfast.   Yes [provider]  Diaper Rash Products (DESITIN) OINT Apply topically daily as needed.    [provider]  fluconazole (DIFLUCAN) 150 MG tablet Take 1 tablet (150 mg total) by mouth daily. 04/03/20   Janace Aris, NP  nystatin-triamcinolone (MYCOLOG  II) cream Apply to affected area daily 04/03/20   Dahlia Byes A, NP  phenazopyridine (PYRIDIUM) 200 MG tablet Take 1 tablet (200 mg total) by mouth 3 (three) times daily. 01/31/20   Moshe Cipro, NP    Family History Family History  Problem Relation Age of Onset  . Diabetes Mother   . Healthy Father   . Breast cancer Neg Hx   . Colon cancer Neg Hx   . Esophageal cancer Neg Hx   . Rectal cancer Neg Hx   . Stomach cancer Neg Hx     Social History Social History   Tobacco Use  . Smoking status: Never Smoker  . Smokeless tobacco: Never Used  Vaping Use  . Vaping Use: Never used  Substance Use Topics  . Alcohol use: Yes    Comment: ocassionally   . Drug use: No     Allergies   Patient has no known allergies.   Review of Systems Review of Systems   Physical Exam Triage Vital Signs ED Triage Vitals  Enc Vitals Group     BP 04/03/20 1000 (!) 99/59     Pulse Rate 04/03/20 1000 60  Resp 04/03/20 1000 18     Temp 04/03/20 1000 98.2 F (36.8 C)     Temp src --      SpO2 04/03/20 1000 100 %     Weight --      Height --      Head Circumference --      Peak Flow --      Pain Score 04/03/20 0958 0     Pain Loc --      Pain Edu? --      Excl. in GC? --    No data found.  Updated Vital Signs BP (!) 99/59   Pulse 60   Temp 98.2 F (36.8 C)   Resp 18   LMP 03/16/2020   SpO2 100%   Visual Acuity Right Eye Distance:   Left Eye Distance:   Bilateral Distance:    Right Eye Near:   Left Eye Near:    Bilateral Near:     Physical Exam Vitals and nursing note reviewed.  Constitutional:      General: She is not in acute distress.    Appearance: Normal appearance. She is not ill-appearing, toxic-appearing or diaphoretic.  HENT:     Head: Normocephalic.     Nose: Nose normal.  Eyes:     Conjunctiva/sclera: Conjunctivae normal.  Pulmonary:     Effort: Pulmonary effort is normal.  Musculoskeletal:        General: Normal range of motion.     Cervical  back: Normal range of motion.  Skin:    General: Skin is warm and dry.     Findings: No rash.  Neurological:     Mental Status: She is alert.  Psychiatric:        Mood and Affect: Mood normal.      UC Treatments / Results  Labs (all labs ordered are listed, but only abnormal results are displayed) Labs Reviewed  POCT URINALYSIS DIPSTICK, ED / UC    EKG   Radiology No results found.  Procedures Procedures (including critical care time)  Medications Ordered in UC Medications - No data to display  Initial Impression / Assessment and Plan / UC Course  I have reviewed the triage vital signs and the nursing notes.  Pertinent labs & imaging results that were available during my care of the patient were reviewed by me and considered in my medical decision making (see chart for details).     Vaginitis Treating for yeast infection Medication as prescribed Urine without infection Follow up as needed for continued or worsening symptoms  Final Clinical Impressions(s) / UC Diagnoses   Final diagnoses:  Vaginitis and vulvovaginitis     Discharge Instructions     Treating you for a yeast infection. Take the medication as prescribed. Urine normal Follow up as needed for continued or worsening symptoms     ED Prescriptions    Medication Sig Dispense Auth. Provider   fluconazole (DIFLUCAN) 150 MG tablet Take 1 tablet (150 mg total) by mouth daily. 2 tablet Edison Nicholson A, NP   nystatin-triamcinolone (MYCOLOG II) cream Apply to affected area daily 15 g Mory Herrman A, NP     PDMP not reviewed this encounter.   Dahlia Byes A, NP 04/03/20 1106

## 2020-05-05 ENCOUNTER — Other Ambulatory Visit: Payer: Self-pay

## 2020-05-05 ENCOUNTER — Ambulatory Visit: Payer: Self-pay | Attending: Internal Medicine

## 2020-06-25 ENCOUNTER — Other Ambulatory Visit: Payer: Self-pay | Admitting: Physician Assistant

## 2020-06-25 MED FILL — POLYETHYLENE GLYCOL 3350 PO: 17 | 14 days supply | Qty: 238 | Fill #0

## 2020-08-08 ENCOUNTER — Other Ambulatory Visit (HOSPITAL_BASED_OUTPATIENT_CLINIC_OR_DEPARTMENT_OTHER): Payer: Self-pay | Admitting: Internal Medicine

## 2020-08-08 DIAGNOSIS — Z1231 Encounter for screening mammogram for malignant neoplasm of breast: Secondary | ICD-10-CM

## 2020-08-21 ENCOUNTER — Ambulatory Visit (HOSPITAL_BASED_OUTPATIENT_CLINIC_OR_DEPARTMENT_OTHER)
Admission: RE | Admit: 2020-08-21 | Discharge: 2020-08-21 | Disposition: A | Discharge status: Home / Self Care | Payer: Self-pay | Source: Ambulatory Visit | Attending: Internal Medicine | Admitting: Internal Medicine

## 2020-08-21 ENCOUNTER — Other Ambulatory Visit: Payer: Self-pay

## 2020-08-21 DIAGNOSIS — Z1231 Encounter for screening mammogram for malignant neoplasm of breast: Secondary | ICD-10-CM | POA: Insufficient documentation

## 2020-08-27 ENCOUNTER — Telehealth: Payer: Self-pay

## 2020-08-27 NOTE — Telephone Encounter (Signed)
Patient returned call and would like a call back. She can be reached at [phone]. Please advise

## 2020-08-27 NOTE — Telephone Encounter (Signed)
[employer]  Kristen Holt Id# [phone]  contacted pt to go over lab results pt didn't answer lvm

## 2020-08-27 NOTE — Telephone Encounter (Signed)
Caled pt made aware of MD results note regarding mamo."Let patient know that her mammogram is normal.".Verbalized understanding

## 2020-09-10 ENCOUNTER — Other Ambulatory Visit: Payer: Self-pay | Admitting: Medical

## 2020-09-11 MED FILL — CYCLOBENZAPRINE 10 MG TAB: 10 | 10 days supply | Qty: 10 | Fill #0

## 2020-09-11 MED FILL — MELOXICAM 15 MG TABLET: 15 | 30 days supply | Qty: 30 | Fill #0

## 2020-09-11 MED FILL — MECLIZINE 25 MG TABLET: 25 | 10 days supply | Qty: 30 | Fill #0

## 2020-11-17 ENCOUNTER — Ambulatory Visit: Payer: Self-pay

## 2020-12-01 ENCOUNTER — Other Ambulatory Visit: Payer: Self-pay

## 2020-12-01 ENCOUNTER — Ambulatory Visit: Payer: Self-pay | Admitting: Physician Assistant

## 2020-12-01 VITALS — BP 102/68 | HR 68 | Temp 98.2°F | Resp 18 | Ht 62.0 in | Wt 142.0 lb

## 2020-12-01 DIAGNOSIS — D649 Anemia, unspecified: Secondary | ICD-10-CM

## 2020-12-01 DIAGNOSIS — K6289 Other specified diseases of anus and rectum: Secondary | ICD-10-CM

## 2020-12-01 DIAGNOSIS — R5383 Other fatigue: Secondary | ICD-10-CM

## 2020-12-01 LAB — POCT GLYCOSYLATED HEMOGLOBIN (HGB A1C): Hemoglobin A1C: 5.3 % (ref 4.0–5.6)

## 2020-12-01 NOTE — Progress Notes (Signed)
Patient has eaten today. Patient denies pain at this time. Patient reports a week ago feeling tired and not knowing why. Patient cleans for a living and shares she feels as though she has no energy to work or do daily task.

## 2020-12-01 NOTE — Progress Notes (Signed)
Established Patient Office Visit  Subjective:  Patient ID: Kristen Holt, female    DOB: August 19, 1975  Age: 45 y.o. MRN: [id number]  CC:  Chief Complaint  Patient presents with  . Fatigue    HPI Kristen Holt reports that she has been having fatigue for the past week.  States that she is sleeping well, states that she is getting 7-8 hours of sleep.  States that she continues to have a menses monthly, no issues with that.  States that she has been treated for anemia in the past.  States that she takes iron a couple of times a week.  Denies any new stressors  Or depressive moods  States that she has difficulty getting her rectum clean after having a BM.  States that it feels irritated.  Denies straining to go. Denies blood or tissue or in toilet. States that she has tried desitin with some relief.   Due to language barrier, an interpreter was present during the history-taking and subsequent discussion (and for part of the physical exam) with this patient.          Past Medical History:  Diagnosis Date  . Acute appendicitis 01/13/2012  . Anemia   . COVID-19   . Recurrent UTI   . Thyroid disease     Past Surgical History:  Procedure Laterality Date  . APPENDECTOMY    . COLONOSCOPY    . LAPAROSCOPIC APPENDECTOMY  01/18/2012   Procedure: APPENDECTOMY LAPAROSCOPIC;  Surgeon: Haywood Lasso, MD;  Location: [facility] OR;  Service: General;  Laterality: N/A;    Family History  Problem Relation Age of Onset  . Diabetes Mother   . Healthy Father   . Breast cancer Neg Hx   . Colon cancer Neg Hx   . Esophageal cancer Neg Hx   . Rectal cancer Neg Hx   . Stomach cancer Neg Hx     Social History   Socioeconomic History  . Marital status: Single    Spouse name: Not on file  . Number of children: Not on file  . Years of education: Not on file  . Highest education level: Not on file  Occupational History  . Not on file  Tobacco Use  . Smoking  status: Never Smoker  . Smokeless tobacco: Never Used  Vaping Use  . Vaping Use: Never used  Substance and Sexual Activity  . Alcohol use: Yes    Comment: ocassionally   . Drug use: No  . Sexual activity: Yes    Partners: Male  Other Topics Concern  . Not on file  Social History Narrative   ** Merged History Encounter **       Social Determinants of Health   Financial Resource Strain: Not on file  Food Insecurity: Not on file  Transportation Needs: Not on file  Physical Activity: Not on file  Stress: Not on file  Social Connections: Not on file  Intimate Partner Violence: Not on file    Outpatient Medications Prior to Visit  Medication Sig Dispense Refill  . Diaper Rash Products (DESITIN) OINT Apply topically daily as needed.    . ferrous sulfate 325 (65 FE) MG tablet Take 325 mg by mouth daily with breakfast.    . meloxicam (MOBIC) 15 MG tablet TAKE 1 TABLET EVERY DAY BY ORAL ROUTE. 30 tablet 0  . polyethylene glycol powder (GLYCOLAX/MIRALAX) 17 GM/SCOOP powder TAKE 1 CAPFUL DAILY AS NEEDED. DISSOLVE IN 4-8 OUNCES OF LIQUID. MAY TAKE 1-3 DAYS TO PRODUCE BOWEL  MOVEMENT. USE SHORTNESS EFFECTIVE TREATMENT 238 g 2  . cyclobenzaprine (FLEXERIL) 10 MG tablet TAKE 1 TABLET EVERY DAY BY ORAL ROUTE AT BEDTIME. 10 tablet 0  . meclizine (ANTIVERT) 25 MG tablet TAKE 1 TABLET 3 TIMES A DAY BY ORAL ROUTE AS NEEDED. 30 tablet 0  . nystatin-triamcinolone (MYCOLOG II) cream Apply to affected area daily 15 g 0  . fluconazole (DIFLUCAN) 150 MG tablet Take 1 tablet (150 mg total) by mouth daily. 2 tablet 0  . phenazopyridine (PYRIDIUM) 200 MG tablet Take 1 tablet (200 mg total) by mouth 3 (three) times daily. 6 tablet 0   Facility-Administered Medications Prior to Visit  Medication Dose Route Frequency Provider Last Rate Last Admin  . 0.9 %  sodium chloride infusion  500 mL Intravenous Once Cirigliano, Vito V, DO        No Known Allergies  ROS Review of Systems  Constitutional: Positive  for fatigue. Negative for chills and fever.  HENT: Negative.   Eyes: Negative.   Respiratory: Negative for shortness of breath.   Cardiovascular: Negative for chest pain and palpitations.  Gastrointestinal: Negative for anal bleeding, blood in stool, constipation, diarrhea, nausea and vomiting.  Endocrine: Negative.   Genitourinary: Negative.   Musculoskeletal: Negative.   Skin: Negative.   Allergic/Immunologic: Negative.   Neurological: Negative for dizziness and light-headedness.  Hematological: Negative.   Psychiatric/Behavioral: Negative for dysphoric mood, self-injury, sleep disturbance and suicidal ideas. The patient is not nervous/anxious.       Objective:    Physical Exam Vitals and nursing note reviewed.  Constitutional:      Appearance: Normal appearance.  HENT:     Head: Normocephalic and atraumatic.     Right Ear: External ear normal.     Left Ear: External ear normal.     Nose: Nose normal.     Mouth/Throat:     Mouth: Mucous membranes are moist.     Pharynx: Oropharynx is clear.  Eyes:     Extraocular Movements: Extraocular movements intact.     Conjunctiva/sclera: Conjunctivae normal.     Pupils: Pupils are equal, round, and reactive to light.  Cardiovascular:     Rate and Rhythm: Normal rate and regular rhythm.     Pulses: Normal pulses.     Heart sounds: Normal heart sounds.  Pulmonary:     Effort: Pulmonary effort is normal.     Breath sounds: Normal breath sounds.  Musculoskeletal:        General: Normal range of motion.     Cervical back: Normal range of motion and neck supple.  Skin:    General: Skin is warm and dry.  Neurological:     General: No focal deficit present.     Mental Status: She is alert and oriented to person, place, and time.  Psychiatric:        Mood and Affect: Mood normal.        Behavior: Behavior normal.        Thought Content: Thought content normal.        Judgment: Judgment normal.     BP 102/68 (BP Location: Left  Arm, Patient Position: Sitting, Cuff Size: Normal)   Pulse 68   Temp 98.2 F (36.8 C) (Oral)   Resp 18   Ht $R'5\' 2"'gh$  (1.575 m)   Wt 142 lb (64.4 kg)   SpO2 100%   BMI 25.97 kg/m  Wt Readings from Last 3 Encounters:  12/01/20 142 lb (64.4 kg)  03/20/20 142 lb (  64.4 kg)  02/26/20 142 lb (64.4 kg)     Health Maintenance Due  Topic Date Due  . Hepatitis C Screening  Never done  . PAP SMEAR-Modifier  11/24/2019    There are no preventive care reminders to display for this patient.  Lab Results  Component Value Date   TSH 1.500 12/01/2020   Lab Results  Component Value Date   WBC 7.1 12/01/2020   HGB 12.3 12/01/2020   HCT 38.3 12/01/2020   MCV 93 12/01/2020   PLT 335 12/01/2020   Lab Results  Component Value Date   NA 129 (L) 12/01/2020   K 3.8 12/01/2020   CO2 26 11/12/2019   GLUCOSE 99 12/01/2020   BUN 9 12/01/2020   CREATININE 0.59 12/01/2020   BILITOT <0.2 12/01/2020   ALKPHOS 61 12/01/2020   AST 21 12/01/2020   ALT 14 11/12/2019   PROT 6.5 12/01/2020   ALBUMIN 4.1 12/01/2020   CALCIUM 8.9 12/01/2020   ANIONGAP 7 04/09/2017   EGFR 114 12/01/2020   GFR 134.61 11/12/2019   No results found for: CHOL No results found for: HDL No results found for: LDLCALC No results found for: TRIG No results found for: CHOLHDL Lab Results  Component Value Date   HGBA1C 5.3 12/01/2020      Assessment & Plan:   Problem List Items Addressed This Visit      Other   Anemia   Relevant Orders   CBC with Differential/Platelet (Completed)   Iron, TIBC and Ferritin Panel (Completed)   Fatigue - Primary   Relevant Orders   Comp. Metabolic Panel (12) (Completed)   TSH (Completed)   HgB A1c (Completed)   Rectal discomfort    1. Fatigue, unspecified type A1c 5.3.  Patient encouraged to take iron on a daily basis as directed.  Continue proper hydration, proper rest.    Red flags given for prompt reevaluation. - Comp. Metabolic Panel (12) - TSH - HgB A1c  2. Anemia,  unspecified type  - CBC with Differential/Platelet - Iron, TIBC and Ferritin Panel  3. Rectal discomfort More than likely hemorrhoidal discomfort, I encourage patient to do trial of Preparation H ointment over-the-counter.   I have reviewed the patient's medical history (PMH, PSH, Social History, Family History, Medications, and allergies) , and have been updated if relevant. I spent 32 minutes reviewing chart and  face to face time with patient.      No orders of the defined types were placed in this encounter.   Follow-up: Return if symptoms worsen or fail to improve.    Loraine Grip Mayers, PA-C

## 2020-12-01 NOTE — Patient Instructions (Addendum)
I encourage you to try some over-the-counter hemorrhoid cream for your rectal discomfort.  We will call you with your lab results.  I do encourage you to take the iron on a daily basis.  Please let us know if there is anything else we can do for you  Kennieth Rad, PA-C Physician Assistant Rafael Capo [email or web address]     Fatiga Fatigue Si siente fatiga, tiene una sensacin de cansancio en todo momento y falta de energa o falta de motivacin. La fatiga puede dificultar el inicio o la realizacin de tareas debido al agotamiento. Por lo general, la fatiga ocasional o leve con frecuencia es una reaccin normal a la actividad o la vida. Sin embargo, la fatiga de Engineer, site duracin (crnica) o extrema puede ser sntoma de una enfermedad. Siga estas indicaciones en su casa: Instrucciones generales  Controle su fatiga para ver si hay cambios.  Acustese y levntese a la misma hora todos [place].  Evite la fatiga moderando su ritmo [place] y durmiendo lo suficiente durante la noche.  Mantenga un peso saludable. [place] los medicamentos de venta libre y los recetados solamente como se lo haya indicado el mdico.  Tome una multivitamina, si se lo indic el mdico.  No tome ningn complemento alimenticio o a base de hierbas a menos que lo haya autorizado el mdico. Actividad  Realice ejercicio con regularidad como se lo haya indicado el mdico.  Practique tcnicas que lo ayuden a Nurse, children's, como yoga, tai chi, meditacin, terapia de masajes.   Comida y bebida  Evite las comidas abundantes a la noche.  Consuma una dieta bien balanceada que incluya protenas magras, cereales integrales, abundantes frutas, verduras y productos lcteos descremados.  Evite el consumo excesivo de cafena.  Evite el consumo de alcohol.  Beba suficiente lquido para Consulting civil engineer orina de color amarillo plido.   [place] situaciones que le provocan estrs. Trate de que su horario de [place] y personal estn equilibrados.  No consuma ningn producto que contenga nicotina o tabaco, como cigarrillos y Psychologist, sport and exercise. Si necesita ayuda para dejar de fumar, consulte al mdico.  No consuma drogas. Comunquese con un mdico si:  La fatiga no mejora.  Tiene fiebre.  Pierde peso o [place] de peso de forma repentina.  Tiene dolores de [place].  Tiene problemas para conciliar el sueo o para dormir en la noche.  Se siente enojado, culpable, ansioso o triste.  No puede defecar (estreimiento).  Tiene la piel seca.  Observa hinchazn en las piernas u otras partes del cuerpo. Solicite ayuda de inmediato si:  Se siente confundido.  Tiene visin borrosa.  Tiene mareos o se desmaya.  Tiene un dolor de cabeza intenso.  Siente dolor intenso en el abdomen, la espalda o el rea entre la cintura y las caderas (pelvis).  Tiene dolor de pecho, dificultad para respirar, o latidos cardacos irregulares o acelerados.  No puede orinar u [employer] de lo normal.  Presenta sangrado anormal, como sangrado del recto, la vagina, la nariz, los pulmones o los pezones.  Vomita sangre.  Tiene pensamientos acerca de lastimarse a usted mismo o a Producer, television/film/video. Si alguna vez siente que puede lastimarse o Market researcher a los dems, o tiene pensamientos de poner fin a su vida, busque ayuda de inmediato. Puede dirigirse al servicio de emergencias ms cercano o comunicarse con:  El servicio de Therapist, sports (911 en los [place]).  Una lnea de asistencia al  suicida y Freight forwarder en crisis, como la [employer] de Prevencin del Suicidio (National Suicide Prevention Lifeline) al [phone]. Est disponible las 24 horas del da. Resumen  Si siente fatiga, tiene una sensacin de cansancio en todo momento y falta de energa o falta de motivacin.  La fatiga puede dificultar el inicio o la  realizacin de tareas debido al agotamiento.  La fatiga de larga duracin (crnica) o extrema puede ser sntoma de una enfermedad.  Realice ejercicio con regularidad como se lo haya indicado el mdico.  Cambie las situaciones que le provocan estrs. Trate de que su horario de [place] y personal estn equilibrados. Esta informacin no tiene Marine scientist el consejo del mdico. Asegrese de hacerle al mdico cualquier pregunta que tenga. Document Revised: 07/06/2017 Document Reviewed: 07/06/2017 Elsevier Patient Education  2021 [employer].

## 2020-12-02 DIAGNOSIS — K6289 Other specified diseases of anus and rectum: Secondary | ICD-10-CM | POA: Insufficient documentation

## 2020-12-02 LAB — COMP. METABOLIC PANEL (12)
AST: 21 IU/L (ref 0–40)
Albumin/Globulin Ratio: 1.7 (ref 1.2–2.2)
Albumin: 4.1 g/dL (ref 3.8–4.8)
Alkaline Phosphatase: 61 IU/L (ref 44–121)
BUN/Creatinine Ratio: 15 (ref 9–23)
BUN: 9 mg/dL (ref 6–24)
Bilirubin Total: <0.2 mg/dL (ref 0.0–1.2)
Calcium: 8.9 mg/dL (ref 8.7–10.2)
Chloride: 103 mmol/L (ref 96–106)
Creatinine, Ser: 0.59 mg/dL (ref 0.57–1.00)
Globulin, Total: 2.4 g/dL (ref 1.5–4.5)
Glucose: 99 mg/dL (ref 65–99)
Potassium: 3.8 mmol/L (ref 3.5–5.2)
Sodium: 129 mmol/L — ABNORMAL LOW (ref 134–144)
Total Protein: 6.5 g/dL (ref 6.0–8.5)
eGFR: 114 mL/min/{1.73_m2} (ref >59)

## 2020-12-02 LAB — CBC WITH DIFFERENTIAL/PLATELET
Basophils Absolute: 0.1 10*3/uL (ref 0.0–0.2)
Basos: 1 %
EOS (ABSOLUTE): 0.3 10*3/uL (ref 0.0–0.4)
Eos: 4 %
Hematocrit: 38.3 % (ref 34.0–46.6)
Hemoglobin: 12.3 g/dL (ref 11.1–15.9)
Immature Grans (Abs): 0 10*3/uL (ref 0.0–0.1)
Immature Granulocytes: 0 %
Lymphocytes Absolute: 2.3 10*3/uL (ref 0.7–3.1)
Lymphs: 32 %
MCH: 29.9 pg (ref 26.6–33.0)
MCHC: 32.1 g/dL (ref 31.5–35.7)
MCV: 93 fL (ref 79–97)
Monocytes Absolute: 0.6 10*3/uL (ref 0.1–0.9)
Monocytes: 9 %
Neutrophils Absolute: 3.9 10*3/uL (ref 1.4–7.0)
Neutrophils: 54 %
Platelets: 335 10*3/uL (ref 150–450)
RBC: 4.12 x10E6/uL (ref 3.77–5.28)
RDW: 13.2 % (ref 11.7–15.4)
WBC: 7.1 10*3/uL (ref 3.4–10.8)

## 2020-12-02 LAB — IRON,TIBC AND FERRITIN PANEL
Ferritin: 18 ng/mL (ref 15–150)
Iron Saturation: 14 % — ABNORMAL LOW (ref 15–55)
Iron: 43 ug/dL (ref 27–159)
Total Iron Binding Capacity: 318 ug/dL (ref 250–450)
UIBC: 275 ug/dL (ref 131–425)

## 2020-12-02 LAB — TSH: TSH: 1.5 u[IU]/mL (ref 0.450–4.500)

## 2020-12-08 ENCOUNTER — Ambulatory Visit: Payer: Self-pay

## 2020-12-10 ENCOUNTER — Telehealth: Payer: Self-pay | Admitting: *Deleted

## 2020-12-10 NOTE — Telephone Encounter (Signed)
Medical Assistant used [employer] to contact patient. Interpreter Name: Trixie Deis #: [id number] Patient was not available x2 attempts, [employer] left patient a voicemail. Voicemail states to give a call back to [place] with [facility] at [phone].

## 2020-12-10 NOTE — Telephone Encounter (Signed)
-----   Message from Roney Jaffe, [place] sent at 12/02/2020 10:26 AM EDT ----- Please call patient and let her know that she does not show signs of anemia and her iron levels are within normal limits.  She is currently taking her iron a couple times a week and that would be appropriate for her to continue.  Her kidney function and liver function are within normal limits as well as her thyroid function is within normal limits.  Her cause of fatigue may be from her sodium level being below normal limits.  It is on the upper end of moderate hyponatremia.  Due to her symptom of fatigue, I do encourage her to increase her sodium intake over the next few days, and return to the mobile unit on Monday for follow-up labs.  If she feels as if her symptoms are worsening, I do encourage her to present to the emergency department for prompt evaluation.

## 2020-12-16 ENCOUNTER — Ambulatory Visit: Payer: Self-pay | Attending: Internal Medicine

## 2020-12-16 ENCOUNTER — Other Ambulatory Visit: Payer: Self-pay

## 2021-08-14 ENCOUNTER — Ambulatory Visit: Payer: Self-pay

## 2021-10-06 IMAGING — MG MM DIGITAL SCREENING BILAT W/ TOMO AND CAD
8 series · 9 of 24 positions shown · non-contrast
Comparison: Previous exam(s).

CLINICAL DATA: Screening.

EXAM:
DIGITAL SCREENING BILATERAL MAMMOGRAM WITH TOMO AND CAD

[R MLO synth-2D]
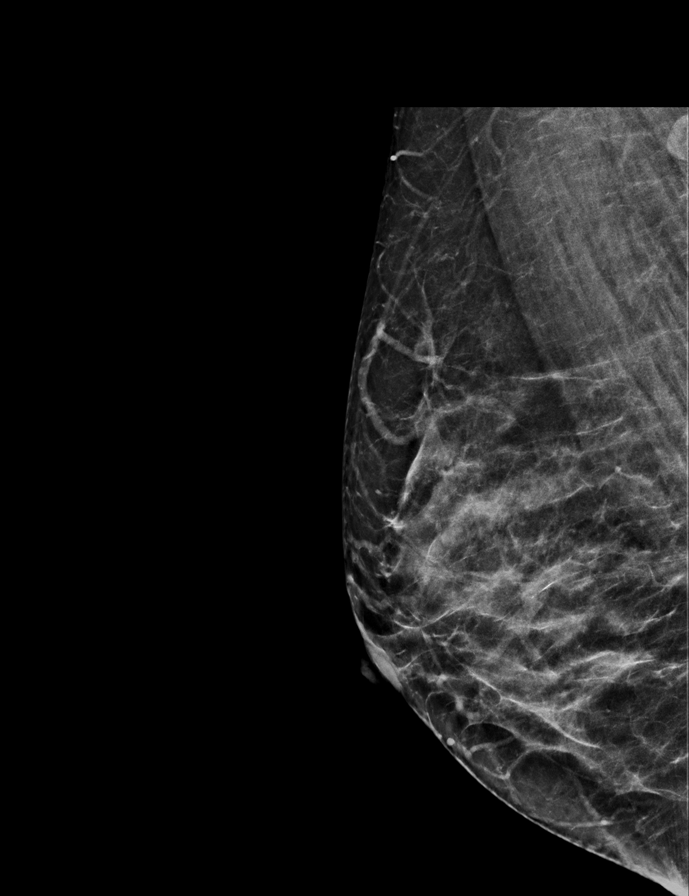

[L CC synth-2D]
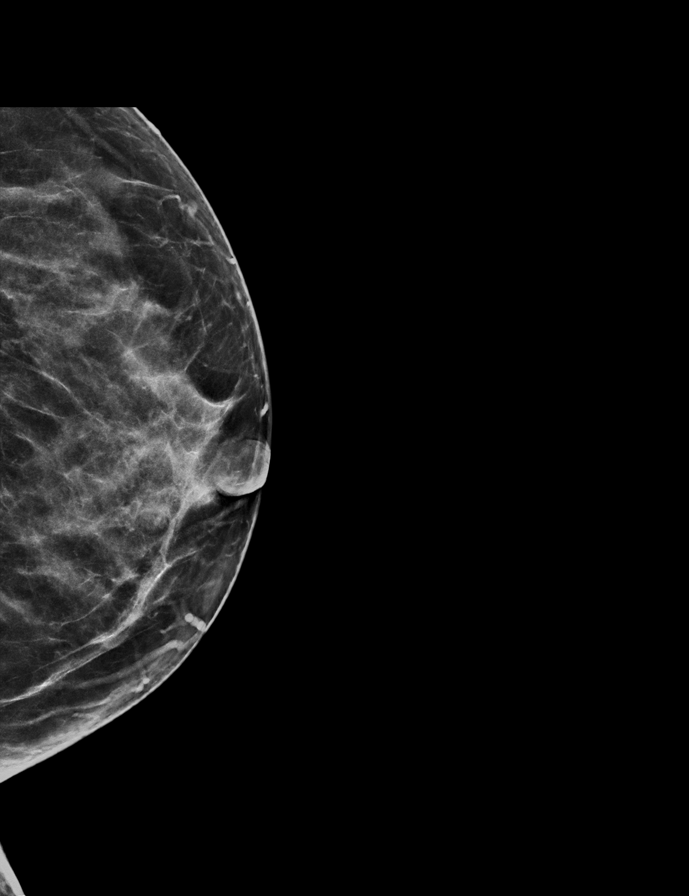

[L MLO synth-2D]
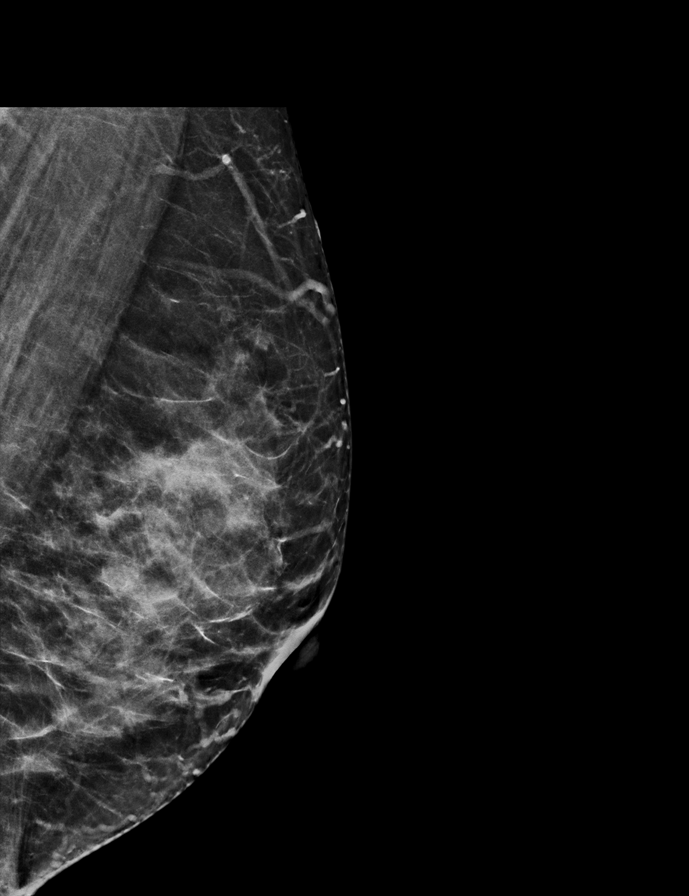

[R CC synth-2D]
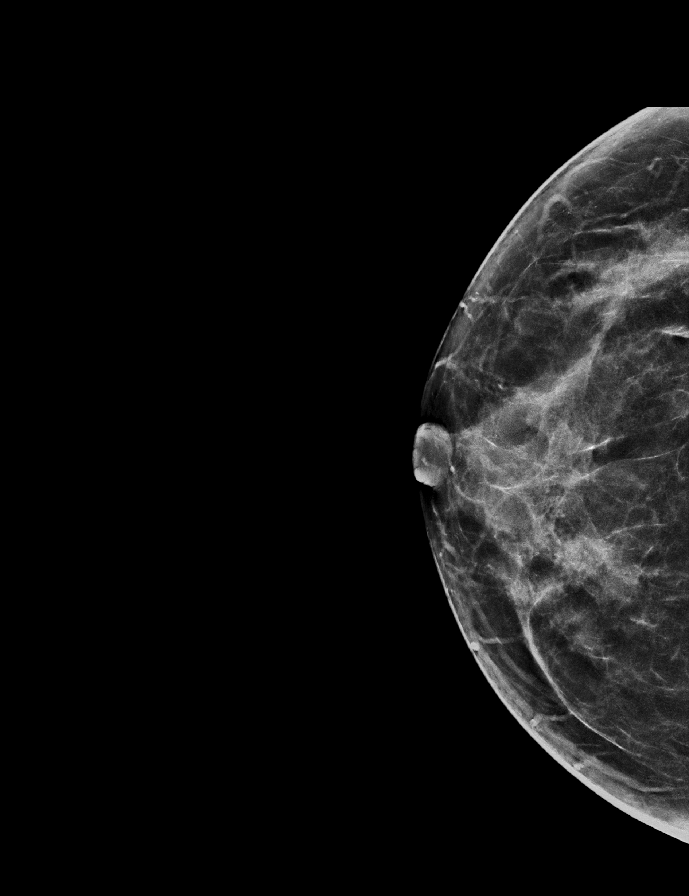

[L MLO tomo · 2 of 59 frames shown]
[frame 20/59]
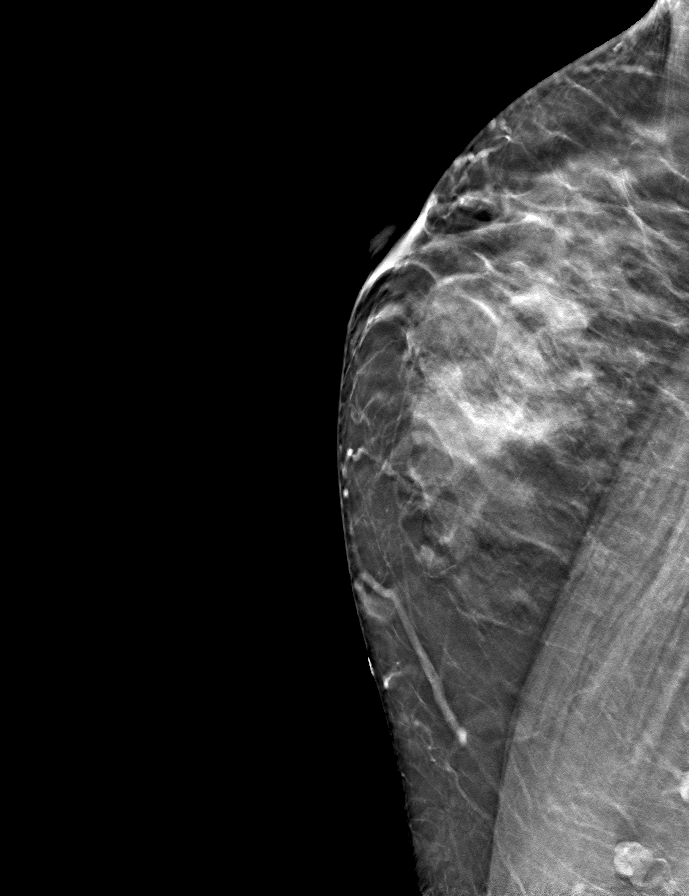
[frame 30/59]
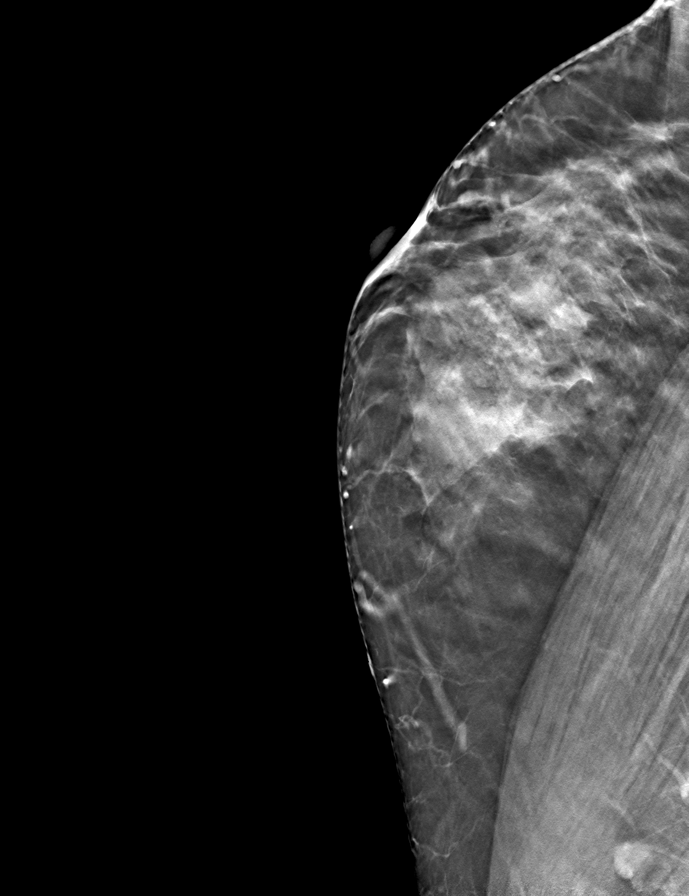

[R CC tomo · tomo slice 29/58.0]
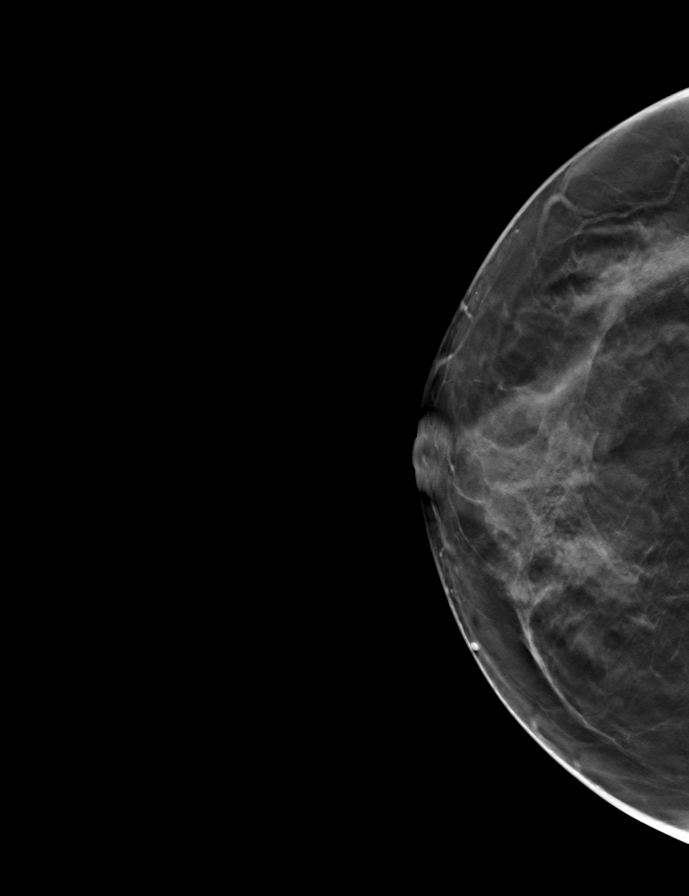

[R MLO tomo · tomo slice 29/58.0]
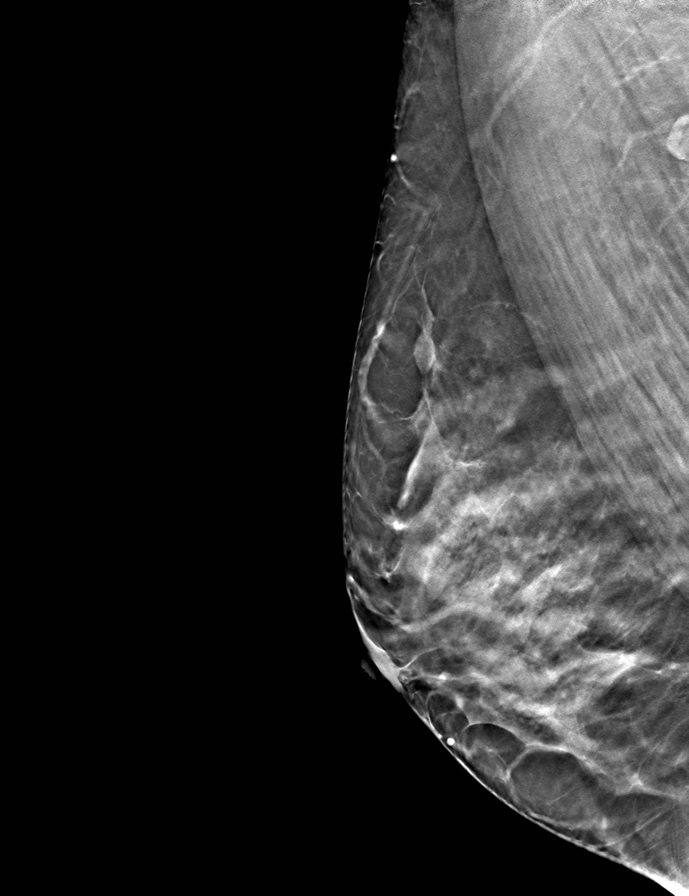

[L CC tomo · tomo slice 30/59.0]
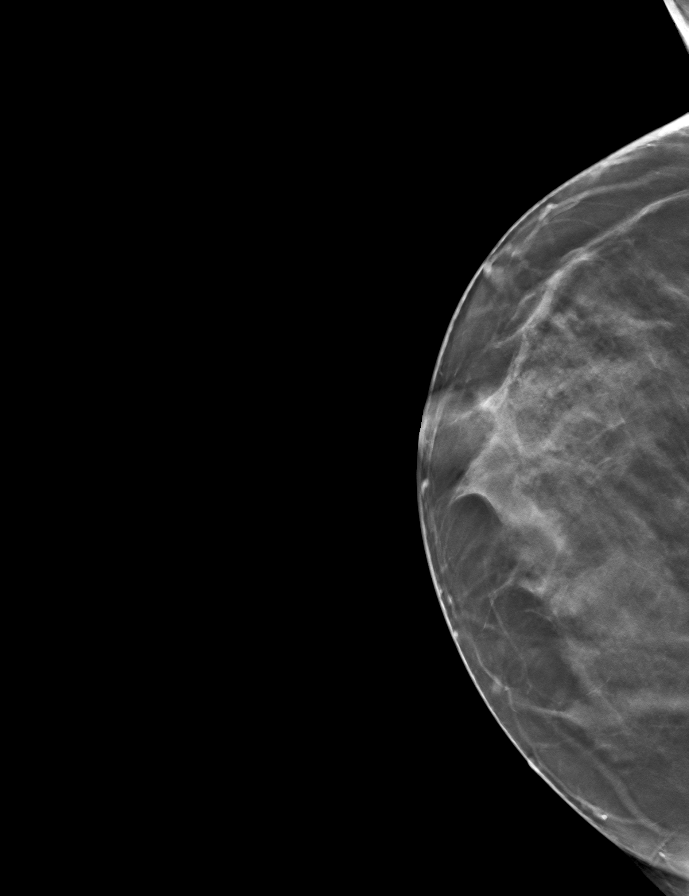

[9 of 24 positions shown; findings below may reference images not displayed]

ACR Breast Density Category c: The breast tissue is heterogeneously
dense, which may obscure small masses.
FINDINGS: There are no findings suspicious for malignancy. Images were
processed with CAD.
IMPRESSION: No mammographic evidence of malignancy. A result letter of this
screening mammogram will be mailed directly to the patient.

RECOMMENDATION:
Screening mammogram in one year. (Code:[id number]-[id number])

BI-RADS CATEGORY  1: Negative.

## 2022-10-29 ENCOUNTER — Ambulatory Visit (HOSPITAL_COMMUNITY)
Admission: EM | Admit: 2022-10-29 | Discharge: 2022-10-29 | Disposition: A | Discharge status: Home / Self Care | Payer: Self-pay | Attending: Emergency Medicine | Admitting: Emergency Medicine

## 2022-10-29 ENCOUNTER — Ambulatory Visit (INDEPENDENT_AMBULATORY_CARE_PROVIDER_SITE_OTHER): Payer: Self-pay

## 2022-10-29 ENCOUNTER — Encounter (HOSPITAL_COMMUNITY): Payer: Self-pay

## 2022-10-29 DIAGNOSIS — R109 Unspecified abdominal pain: Secondary | ICD-10-CM | POA: Insufficient documentation

## 2022-10-29 DIAGNOSIS — R1030 Lower abdominal pain, unspecified: Secondary | ICD-10-CM

## 2022-10-29 DIAGNOSIS — R300 Dysuria: Secondary | ICD-10-CM | POA: Insufficient documentation

## 2022-10-29 LAB — POCT URINALYSIS DIPSTICK, ED / UC
Bilirubin Urine: NEGATIVE
Glucose, UA: NEGATIVE mg/dL
Ketones, ur: NEGATIVE mg/dL
Leukocytes,Ua: NEGATIVE
Nitrite: NEGATIVE
Protein, ur: NEGATIVE mg/dL
Specific Gravity, Urine: 1.015 (ref 1.005–1.030)
Urobilinogen, UA: 0.2 mg/dL (ref 0.0–1.0)
pH: 6 (ref 5.0–8.0)

## 2022-10-29 LAB — POC URINE PREG, ED: Preg Test, Ur: NEGATIVE

## 2022-10-29 MED ORDER — KETOROLAC TROMETHAMINE 60 MG/2ML IM SOLN
60.0000 mg | Freq: Once | INTRAMUSCULAR | Status: AC
  Administered 2022-10-29: 60 mg via INTRAMUSCULAR

## 2022-10-29 MED ORDER — TAMSULOSIN HCL 0.4 MG PO CAPS: 0.4000 mg | ORAL_CAPSULE | Freq: Every day | ORAL | 0 refills | Status: AC

## 2022-10-29 MED ORDER — KETOROLAC TROMETHAMINE 60 MG/2ML IM SOLN
INTRAMUSCULAR | Status: AC
  Filled 2022-10-29: qty 2

## 2022-10-29 MED ORDER — ACETAMINOPHEN 500 MG PO TABS: 500.0000 mg | ORAL_TABLET | Freq: Four times a day (QID) | ORAL | 0 refills | Status: DC | PRN

## 2022-10-29 MED ORDER — IBUPROFEN 800 MG PO TABS: 800.0000 mg | ORAL_TABLET | Freq: Three times a day (TID) | ORAL | 0 refills | Status: DC

## 2022-10-29 NOTE — ED Provider Notes (Signed)
[place]    CSN: [id number] Arrival date & time: 10/29/22  1155      History   Chief Complaint Chief Complaint  Patient presents with   Abdominal Pain   Back Pain   Dysuria    HPI Kristen Holt is a 47 y.o. female.   Patient presents to clinic for bilateral flank pain, dysuria and intermittent lower abdominal pain. Reports lower abdominal pain with urination.  She is currently on her menses and is about to finish with this.  Her last bowel movement was around noon today and reports it was normal soft brown but did have some clear sticky stuff in between her stool.  Her abdominal pain has been intermittent for the past 3 days, has not worsened or improved.  She reports it feels like pressure and pain from the labor, denies nausea vomiting or diarrhea denies fevers or recent illnesses.         The history is provided by the patient. The history is limited by a language barrier. A language interpreter was used.  Abdominal Pain Associated symptoms: dysuria   Associated symptoms: no chest pain, no chills, no cough, no fever, no hematuria, no shortness of breath, no sore throat and no vomiting   Back Pain Associated symptoms: abdominal pain and dysuria   Associated symptoms: no chest pain and no fever   Dysuria Associated symptoms: abdominal pain   Associated symptoms: no fever and no vomiting     Past Medical History:  Diagnosis Date   Acute appendicitis 01/13/2012   Anemia    COVID-19    Recurrent UTI    Thyroid disease     Patient Active Problem List   Diagnosis Date Noted   Rectal discomfort 12/02/2020   Anemia 12/15/2019   Constipation 12/15/2019   Fatigue 12/15/2019   Depression 01/01/2017   Hypothyroid 01/01/2017   Pap smear for cervical cancer screening 08/21/2013   Allergic rhinitis 02/01/2013   Acute appendicitis 01/13/2012    Past Surgical History:  Procedure Laterality Date   APPENDECTOMY     COLONOSCOPY      LAPAROSCOPIC APPENDECTOMY  01/18/2012   Procedure: APPENDECTOMY LAPAROSCOPIC;  Surgeon: Haywood Lasso, MD;  Location: [facility] OR;  Service: General;  Laterality: N/A;    OB History     Gravida  5   Para  4   Term  4   Preterm      AB      Living  4      SAB      IAB      Ectopic      Multiple      Live Births               Home Medications    Prior to Admission medications   Medication Sig Start Date End Date Taking? Authorizing Provider  acetaminophen (TYLENOL) 500 MG tablet Take 1 tablet (500 mg total) by mouth every 6 (six) hours as needed. 10/29/22  Yes Louretta Shorten, [place], FNP  ibuprofen (ADVIL) 800 MG tablet Take 1 tablet (800 mg total) by mouth 3 (three) times daily. 10/29/22  Yes Louretta Shorten, [place], FNP  tamsulosin (FLOMAX) 0.4 MG CAPS capsule Take 1 capsule (0.4 mg total) by mouth daily after supper for 10 days. 10/29/22 11/08/22 Yes Louretta Shorten, [place], FNP  ferrous sulfate 325 (65 FE) MG tablet Take 325 mg by mouth daily with breakfast.    [provider]    Family History Family History  Problem Relation Age of Onset   Diabetes Mother    Healthy Father    Breast cancer Neg Hx    Colon cancer Neg Hx    Esophageal cancer Neg Hx    Rectal cancer Neg Hx    Stomach cancer Neg Hx     Social History Social History   Tobacco Use   Smoking status: Never   Smokeless tobacco: Never  Vaping Use   Vaping Use: Never used  Substance Use Topics   Alcohol use: Yes    Comment: ocassionally    Drug use: No     Allergies   Patient has no known allergies.   Review of Systems Review of Systems  Constitutional:  Negative for chills and fever.  HENT:  Negative for ear pain and sore throat.   Eyes:  Negative for pain and visual disturbance.  Respiratory:  Negative for cough and shortness of breath.   Cardiovascular:  Negative for chest pain and palpitations.  Gastrointestinal:  Positive for abdominal pain. Negative for vomiting.   Genitourinary:  Positive for dysuria. Negative for hematuria.  Musculoskeletal:  Positive for back pain. Negative for arthralgias.  Skin:  Negative for color change and rash.  Neurological:  Negative for seizures and syncope.  All other systems reviewed and are negative.    Physical Exam Triage Vital Signs ED Triage Vitals [10/29/22 1254]  Enc Vitals Group     BP 118/76     Pulse Rate 70     Resp 16     Temp 98.1 F (36.7 C)     Temp Source Oral     SpO2 98 %     Weight      Height      Head Circumference      Peak Flow      Pain Score 9     Pain Loc      Pain Edu?      Excl. in [place]?    No data found.  Updated Vital Signs BP 118/76 (BP Location: Right Arm)   Pulse 70   Temp 98.1 F (36.7 C) (Oral)   Resp 16   LMP 10/25/2022   SpO2 98%   Visual Acuity Right Eye Distance:   Left Eye Distance:   Bilateral Distance:    Right Eye Near:   Left Eye Near:    Bilateral Near:     Physical Exam Vitals and nursing note reviewed.  Constitutional:      General: She is not in acute distress.    Appearance: She is well-developed.  HENT:     Head: Normocephalic and atraumatic.  Eyes:     Conjunctiva/sclera: Conjunctivae normal.  Cardiovascular:     Rate and Rhythm: Normal rate and regular rhythm.     Heart sounds: No murmur heard. Pulmonary:     Effort: Pulmonary effort is normal. No respiratory distress.     Breath sounds: Normal breath sounds.  Abdominal:     General: Abdomen is flat. Bowel sounds are normal.     Palpations: Abdomen is soft.     Tenderness: There is abdominal tenderness in the right lower quadrant and left lower quadrant. There is no right CVA tenderness, left CVA tenderness, guarding or rebound.     Hernia: No hernia is present.  Musculoskeletal:        General: No swelling.     Cervical back: Neck supple.  Skin:    General: Skin is warm and dry.  Capillary Refill: Capillary refill takes less than 2 seconds.  Neurological:     Mental  Status: She is alert.  Psychiatric:        Mood and Affect: Mood normal.      UC Treatments / Results  Labs (all labs ordered are listed, but only abnormal results are displayed) Labs Reviewed  POCT URINALYSIS DIPSTICK, ED / UC - Abnormal; Notable for the following components:      Result Value   Hgb urine dipstick LARGE (*)    All other components within normal limits  URINE CULTURE  POC URINE PREG, ED    EKG   Radiology DG Abd 2 Views  Result Date: 10/29/2022 CLINICAL DATA:  Lower abdominal pain, back pain and dysuria for 3 days EXAM: ABDOMEN - 2 VIEW COMPARISON:  None Available. FINDINGS: Nonobstructive pattern of bowel gas with gas present to the distal colon. Large burden of stool in the colon. Possible gallstones, rib calcifications, or right renal calculi project in the right upper quadrant, assessment limited by overlying stool and bowel gas. There is no evidence of free air. No radio-opaque calculi or other significant radiographic abnormality is seen. IMPRESSION: 1. Nonobstructive pattern of bowel gas with gas present to the distal colon. Large burden of stool in the colon. 2. Possible gallstones, rib calcifications, or right renal calculi project in the right upper quadrant, assessment limited by overlying stool and bowel gas. Consider CT in the setting of otherwise unexplained abdominal and back pain. Electronically Signed   By: Delanna Ahmadi M.D.   On: 10/29/2022 13:49    Procedures Procedures (including critical care time)  Medications Ordered in UC Medications  ketorolac (TORADOL) injection 60 mg (has no administration in time range)    Initial Impression / Assessment and Plan / UC Course  I have reviewed the triage vital signs and the nursing notes.  Pertinent labs & imaging results that were available during my care of the patient were reviewed by me and considered in my medical decision making (see chart for details).  Vitals in triage reviewed, patient is  hemodynamically stable.  Active bowel sounds with diffuse right and lower quadrant tenderness on palpation.  Negative for CVA tenderness.  Urinalysis positive for hemoglobin, patient is on her menses.  Will send urine for culture. Negative urine pregnancy.  DG abdomen showed Possible gallstones, rib calcifications, or right renal calculi project in the right upper quadrant, assessment limited by overlying stool and bowel gas.  Patient denies history of kidney stone, suspect that it could likely be a small kidney stone that is not visualized on the x-ray.  Patient without nausea, vomiting or right upper quadrant pain, lower concern for gallbladder etiology.  Discussed we will treat prophylactically for nephrolithiasis, however, the treatment and diagnosis are limited by the studies available at the urgent care.  Advised to head to the nearest emergency department if no improvement in symptoms over the next 48 hours or any worsening of symptoms for potential CT scan and further evaluation.  Patient verbalized understanding, no questions at this time.      Final Clinical Impressions(s) / UC Diagnoses   Final diagnoses:  Dysuria  Flank pain     Discharge Instructions      Sospecho que su dolor en el costado y su disuria se deben a un posible clculo renal. Tome Flomax diariamente despus de la cena hasta que Arboriculturist. Puede alternar entre Tylenol o ibuprofeno segn sea necesario para Financial controller. No  tome ibuprofeno hasta maana, ya que le hemos administrado Toradol, que es un medicamento similar hoy en la clnica.  Haga un seguimiento en la sala de emergencias ms cercana si presenta un empeoramiento de los sntomas, un empeoramiento del dolor abdominal, nuseas, vmitos o cualquier cambio, ya que es posible que tengan que realizarle una tomografa computarizada para una mejor visualizacin de sus estructuras internas y no tenemos acceso a ella. una tomografa computarizada en la  atencin de [place].      ED Prescriptions     Medication Sig Dispense Auth. Provider   tamsulosin (FLOMAX) 0.4 MG CAPS capsule Take 1 capsule (0.4 mg total) by mouth daily after supper for 10 days. 30 capsule Louretta Shorten, [place], [place]   ibuprofen (ADVIL) 800 MG tablet Take 1 tablet (800 mg total) by mouth 3 (three) times daily. 21 tablet Louretta Shorten, [place], [place]   acetaminophen (TYLENOL) 500 MG tablet Take 1 tablet (500 mg total) by mouth every 6 (six) hours as needed. 30 tablet Mitsuru Dault, [place], [place]      PDMP not reviewed this encounter.   Rejoice Heatwole, [place], [place] 10/29/22 [phone]

## 2022-10-29 NOTE — ED Triage Notes (Signed)
Patient reports that she has had intermittent lower abdominal pain,intermittent bilateral lower back pain, and dysuria x 3 days.  Patient states she has been taking ibuprofen and the alst dose was yesterday.

## 2022-10-29 NOTE — Discharge Instructions (Addendum)
Sospecho que su dolor en el costado y su disuria se deben a un posible clculo renal. Tome Flomax diariamente despus de la cena hasta que Arboriculturist. Puede alternar entre Tylenol o ibuprofeno segn sea necesario para Financial controller. No tome ibuprofeno hasta maana, ya que le hemos administrado Toradol, que es un medicamento similar hoy en la clnica.  Haga un seguimiento en la sala de emergencias ms cercana si presenta un empeoramiento de los sntomas, un empeoramiento del dolor abdominal, nuseas, vmitos o cualquier cambio, ya que es posible que tengan que realizarle una tomografa computarizada para una mejor visualizacin de sus estructuras internas y no tenemos acceso a ella. una tomografa computarizada en la atencin de [place].

## 2022-10-30 ENCOUNTER — Other Ambulatory Visit: Payer: Self-pay

## 2022-10-30 ENCOUNTER — Inpatient Hospital Stay (HOSPITAL_COMMUNITY)
Admission: EM | Admit: 2022-10-30 | Discharge: 2022-11-01 | DRG: 392 | Disposition: A | Discharge status: Home / Self Care | Payer: Self-pay | Attending: Internal Medicine | Admitting: Internal Medicine

## 2022-10-30 ENCOUNTER — Emergency Department (HOSPITAL_COMMUNITY): Payer: Self-pay

## 2022-10-30 ENCOUNTER — Encounter (HOSPITAL_COMMUNITY): Payer: Self-pay | Admitting: Emergency Medicine

## 2022-10-30 DIAGNOSIS — K8020 Calculus of gallbladder without cholecystitis without obstruction: Secondary | ICD-10-CM | POA: Diagnosis present

## 2022-10-30 DIAGNOSIS — E871 Hypo-osmolality and hyponatremia: Secondary | ICD-10-CM

## 2022-10-30 DIAGNOSIS — K5730 Diverticulosis of large intestine without perforation or abscess without bleeding: Secondary | ICD-10-CM | POA: Diagnosis present

## 2022-10-30 DIAGNOSIS — D649 Anemia, unspecified: Secondary | ICD-10-CM | POA: Diagnosis present

## 2022-10-30 DIAGNOSIS — R300 Dysuria: Secondary | ICD-10-CM | POA: Diagnosis present

## 2022-10-30 DIAGNOSIS — K5289 Other specified noninfective gastroenteritis and colitis: Principal | ICD-10-CM | POA: Diagnosis present

## 2022-10-30 DIAGNOSIS — Z79899 Other long term (current) drug therapy: Secondary | ICD-10-CM

## 2022-10-30 DIAGNOSIS — Z603 Acculturation difficulty: Secondary | ICD-10-CM | POA: Diagnosis present

## 2022-10-30 DIAGNOSIS — Z87440 Personal history of urinary (tract) infections: Secondary | ICD-10-CM

## 2022-10-30 DIAGNOSIS — Z9049 Acquired absence of other specified parts of digestive tract: Secondary | ICD-10-CM

## 2022-10-30 LAB — PREGNANCY, URINE: Preg Test, Ur: NEGATIVE

## 2022-10-30 LAB — URINALYSIS, ROUTINE W REFLEX MICROSCOPIC
Bacteria, UA: NONE SEEN
Bilirubin Urine: NEGATIVE
Glucose, UA: NEGATIVE mg/dL
Ketones, ur: NEGATIVE mg/dL
Nitrite: NEGATIVE
Protein, ur: NEGATIVE mg/dL
Specific Gravity, Urine: 1.011 (ref 1.005–1.030)
pH: 5 (ref 5.0–8.0)

## 2022-10-30 LAB — CBC
HCT: 35.3 % — ABNORMAL LOW (ref 36.0–46.0)
Hemoglobin: 11.6 g/dL — ABNORMAL LOW (ref 12.0–15.0)
MCH: 29.2 pg (ref 26.0–34.0)
MCHC: 32.9 g/dL (ref 30.0–36.0)
MCV: 88.9 fL (ref 80.0–100.0)
Platelets: 340 10*3/uL (ref 150–400)
RBC: 3.97 MIL/uL (ref 3.87–5.11)
RDW: 14.1 % (ref 11.5–15.5)
WBC: 13.9 10*3/uL — ABNORMAL HIGH (ref 4.0–10.5)
nRBC: 0 % (ref 0.0–0.2)

## 2022-10-30 LAB — BASIC METABOLIC PANEL
Anion gap: 7 (ref 5–15)
BUN: 13 mg/dL (ref 6–20)
CO2: 23 mmol/L (ref 22–32)
Calcium: 8.6 mg/dL — ABNORMAL LOW (ref 8.9–10.3)
Chloride: 103 mmol/L (ref 98–111)
Creatinine, Ser: 0.59 mg/dL (ref 0.44–1.00)
GFR, Estimated: >60 mL/min (ref >60)
Glucose, Bld: 101 mg/dL — ABNORMAL HIGH (ref 70–99)
Potassium: 3.5 mmol/L (ref 3.5–5.1)
Sodium: 133 mmol/L — ABNORMAL LOW (ref 135–145)

## 2022-10-30 LAB — URINE CULTURE: Culture: NO GROWTH

## 2022-10-30 NOTE — ED Provider Triage Note (Signed)
Emergency Medicine Provider Triage Evaluation Note  Kristen Holt , a 47 y.o. female  was evaluated in triage.  Pt complains of bilateral back pain x 2 weeks. Was seen at [facility] yesterday, had blood in her urine but she is on her menses, however they were concerned about a kidney stone. Was given toradol and d/c with flomax, ibuprofen, and tylenol, still having pain. Hx appendectomy in 2013  Review of Systems  Positive: Back pain Negative: Fever, nausea, vomiting, diarrhea  Physical Exam  BP 127/84 (BP Location: Right Arm)   Pulse 82   Temp 98.3 F (36.8 C)   Resp 18   Ht 5\' 2"  (1.575 m)   Wt 65.8 kg   LMP 10/25/2022   SpO2 94%   BMI 26.52 kg/m  Gen:   Awake, no distress   Resp:  Normal effort  MSK:   Moves extremities without difficulty  Other:    Medical Decision Making  Medically screening exam initiated at 8:18 PM.  Appropriate orders placed.  Kristen Holt was informed that the remainder of the evaluation will be completed by another provider, this initial triage assessment does not replace that evaluation, and the importance of remaining in the ED until their evaluation is complete.  Workup initiated   Kateri Plummer, PA-C 10/30/22 2021

## 2022-10-30 NOTE — ED Triage Notes (Signed)
Pt reports bilateral back pain x 2 weeks. Was seen at [facility] yesterday. Was given meds at [facility] but reports they are not working.

## 2022-10-31 DIAGNOSIS — K5289 Other specified noninfective gastroenteritis and colitis: Principal | ICD-10-CM | POA: Diagnosis present

## 2022-10-31 DIAGNOSIS — E871 Hypo-osmolality and hyponatremia: Secondary | ICD-10-CM

## 2022-10-31 LAB — COMPREHENSIVE METABOLIC PANEL
ALT: 16 U/L (ref 0–44)
AST: 18 U/L (ref 15–41)
Albumin: 3.5 g/dL (ref 3.5–5.0)
Alkaline Phosphatase: 62 U/L (ref 38–126)
Anion gap: 6 (ref 5–15)
BUN: 10 mg/dL (ref 6–20)
CO2: 25 mmol/L (ref 22–32)
Calcium: 8.1 mg/dL — ABNORMAL LOW (ref 8.9–10.3)
Chloride: 106 mmol/L (ref 98–111)
Creatinine, Ser: 0.58 mg/dL (ref 0.44–1.00)
GFR, Estimated: >60 mL/min (ref >60)
Glucose, Bld: 110 mg/dL — ABNORMAL HIGH (ref 70–99)
Potassium: 3.7 mmol/L (ref 3.5–5.1)
Sodium: 137 mmol/L (ref 135–145)
Total Bilirubin: 0.6 mg/dL (ref 0.3–1.2)
Total Protein: 6.6 g/dL (ref 6.5–8.1)

## 2022-10-31 LAB — CBC
HCT: 34.5 % — ABNORMAL LOW (ref 36.0–46.0)
Hemoglobin: 11.1 g/dL — ABNORMAL LOW (ref 12.0–15.0)
MCH: 28.7 pg (ref 26.0–34.0)
MCHC: 32.2 g/dL (ref 30.0–36.0)
MCV: 89.1 fL (ref 80.0–100.0)
Platelets: 279 10*3/uL (ref 150–400)
RBC: 3.87 MIL/uL (ref 3.87–5.11)
RDW: 14.2 % (ref 11.5–15.5)
WBC: 11.7 10*3/uL — ABNORMAL HIGH (ref 4.0–10.5)
nRBC: 0 % (ref 0.0–0.2)

## 2022-10-31 LAB — HIV ANTIBODY (ROUTINE TESTING W REFLEX): HIV Screen 4th Generation wRfx: NONREACTIVE

## 2022-10-31 MED ORDER — MORPHINE SULFATE (PF) 4 MG/ML IV SOLN
4.0000 mg | Freq: Once | INTRAVENOUS | Status: AC
  Administered 2022-10-31: 4 mg via INTRAVENOUS
  Filled 2022-10-31: qty 1

## 2022-10-31 MED ORDER — LACTATED RINGERS IV SOLN: INTRAVENOUS | Status: DC

## 2022-10-31 MED ORDER — METRONIDAZOLE 500 MG/100ML IV SOLN: 500.0000 mg | Freq: Two times a day (BID) | INTRAVENOUS | Status: DC

## 2022-10-31 MED ORDER — SODIUM CHLORIDE 0.9 % IV BOLUS
1000.0000 mL | Freq: Once | INTRAVENOUS | Status: AC
  Administered 2022-10-31: 1000 mL via INTRAVENOUS

## 2022-10-31 MED ORDER — ONDANSETRON HCL 4 MG/2ML IJ SOLN
4.0000 mg | Freq: Once | INTRAMUSCULAR | Status: AC
  Administered 2022-10-31: 4 mg via INTRAVENOUS
  Filled 2022-10-31: qty 2

## 2022-10-31 MED ORDER — CIPROFLOXACIN IN D5W 400 MG/200ML IV SOLN
400.0000 mg | Freq: Once | INTRAVENOUS | Status: AC
  Administered 2022-10-31: 400 mg via INTRAVENOUS
  Filled 2022-10-31: qty 200

## 2022-10-31 MED ORDER — SODIUM CHLORIDE 0.9 % IV SOLN
2.0000 g | INTRAVENOUS | Status: DC
  Administered 2022-10-31 – 2022-11-01 (×2): 2 g via INTRAVENOUS
  Filled 2022-10-31 (×2): qty 20

## 2022-10-31 MED ORDER — POLYETHYLENE GLYCOL 3350 17 G PO PACK
17.0000 g | PACK | Freq: Every day | ORAL | Status: DC
  Administered 2022-10-31 – 2022-11-01 (×2): 17 g via ORAL
  Filled 2022-10-31 (×3): qty 1

## 2022-10-31 MED ORDER — METRONIDAZOLE 500 MG/100ML IV SOLN
500.0000 mg | Freq: Two times a day (BID) | INTRAVENOUS | Status: DC
  Administered 2022-10-31 – 2022-11-01 (×3): 500 mg via INTRAVENOUS
  Filled 2022-10-31 (×3): qty 100

## 2022-10-31 MED ORDER — TRAMADOL HCL 50 MG PO TABS
50.0000 mg | ORAL_TABLET | Freq: Four times a day (QID) | ORAL | Status: DC | PRN
  Administered 2022-10-31: 50 mg via ORAL
  Filled 2022-10-31: qty 1

## 2022-10-31 MED ORDER — INFLUENZA VAC SPLIT QUAD 0.5 ML IM SUSY: 0.5000 mL | PREFILLED_SYRINGE | INTRAMUSCULAR | Status: DC | PRN

## 2022-10-31 MED ORDER — ACETAMINOPHEN 325 MG PO TABS
650.0000 mg | ORAL_TABLET | Freq: Four times a day (QID) | ORAL | Status: DC | PRN
  Administered 2022-10-31 – 2022-11-01 (×2): 650 mg via ORAL
  Filled 2022-10-31 (×2): qty 2

## 2022-10-31 MED ORDER — METRONIDAZOLE 500 MG/100ML IV SOLN
500.0000 mg | Freq: Once | INTRAVENOUS | Status: DC
  Filled 2022-10-31: qty 100

## 2022-10-31 MED ORDER — ACETAMINOPHEN 650 MG RE SUPP: 650.0000 mg | Freq: Four times a day (QID) | RECTAL | Status: DC | PRN

## 2022-10-31 NOTE — H&P (Signed)
History and Physical    Kristen Holt [id number] DOB: 1975/09/01 DOA: 10/30/2022  PCP: Ladell Pier, MD  Patient coming from: Home  Chief Complaint: Abdominal pain  HPI: Kristen Holt is a 47 y.o. female with medical history significant of anemia, recurrent UTI, history of appendectomy. She was seen at urgent care on 10/29/2022 for bilateral flank pain, dysuria, and lower abdominal pain. Workup did not reveal UTI. There was concern for possible kidney stone and she was discharged with prescriptions for Flomax, ibuprofen, and Tylenol. She returns to the ED tonight with ongoing symptoms. Vital signs stable. Labs showing WBC 13.9, hemoglobin 11.6 (no significant change from baseline), sodium 133, creatinine 0.5, urine pregnancy test negative. UA with negative nitrite, trace leukocytes, and microscopy showing 0-5 WBCs, and no bacteria.  CT renal stone study showing: "IMPRESSION: 1. Focal circumferential bowel wall thickening and pericolonic inflammatory stranding involving the distal sigmoid colon surrounding a moderate volume of stool which may reflect changes of an infectious, inflammatory, or stercoral colitis. Moderate colonic stool burden is present proximal to the region of inflammatory change. 2. Moderate sigmoid diverticulosis. 3. Cholelithiasis.   Aortic Atherosclerosis (ICD10-I70.0)."   Patient received morphine, Zofran, Cipro, Flagyl, and 1 L normal saline bolus.  [facility] called to admit.  Patient is endorsing 1 week history of bilateral lower quadrant abdominal pain and low back pain.  She denies any falls or injury to her back.  She is endorsing dysuria and urinary frequency/urgency.  Reporting subjective fevers and chills.  She is reporting constipation, last bowel movement was yesterday.  Denies any blood in her stool.  She is sexually active but denies any vaginal/pelvic pain or discharge.  No other complaints.  Denies cough, shortness of breath, or  chest pain.  Review of Systems:  Review of Systems  All other systems reviewed and are negative.   Past Medical History:  Diagnosis Date   Acute appendicitis 01/13/2012   Anemia    COVID-19    Recurrent UTI    Thyroid disease     Past Surgical History:  Procedure Laterality Date   APPENDECTOMY     COLONOSCOPY     LAPAROSCOPIC APPENDECTOMY  01/18/2012   Procedure: APPENDECTOMY LAPAROSCOPIC;  Surgeon: Haywood Lasso, MD;  Location: [facility];  Service: General;  Laterality: N/A;     reports that she has never smoked. She has never used smokeless tobacco. She reports current alcohol use. She reports that she does not use drugs.  No Known Allergies  Family History  Problem Relation Age of Onset   Diabetes Mother    Healthy Father    Breast cancer Neg Hx    Colon cancer Neg Hx    Esophageal cancer Neg Hx    Rectal cancer Neg Hx    Stomach cancer Neg Hx     Prior to Admission medications   Medication Sig Start Date End Date Taking? Authorizing Provider  acetaminophen (TYLENOL) 500 MG tablet Take 1 tablet (500 mg total) by mouth every 6 (six) hours as needed. Patient taking differently: Take 500 mg by mouth every 6 (six) hours as needed for moderate pain. 10/29/22  Yes Louretta Shorten, [place], FNP  ferrous sulfate 325 (65 FE) MG tablet Take 325 mg by mouth daily with breakfast.   Yes [provider]  ibuprofen (ADVIL) 800 MG tablet Take 1 tablet (800 mg total) by mouth 3 (three) times daily. 10/29/22  Yes Louretta Shorten, [place], FNP  tamsulosin (FLOMAX) 0.4 MG CAPS capsule Take 1 capsule (  0.4 mg total) by mouth daily after supper for 10 days. 10/29/22 11/08/22  [place], [place], FNP    Physical Exam: Vitals:   10/30/22 2011 10/31/22 0000  BP: 127/84 132/88  Pulse: 82 87  Resp: 18 15  Temp: 98.3 F (36.8 C) 98.5 F (36.9 C)  SpO2: 94% 95%  Weight: 65.8 kg   Height: 5\' 2"  (1.575 m)     Physical Exam Vitals reviewed.  Constitutional:      General: She is not in  acute distress. HENT:     Head: Normocephalic and atraumatic.  Eyes:     Extraocular Movements: Extraocular movements intact.  Cardiovascular:     Rate and Rhythm: Normal rate and regular rhythm.     Pulses: Normal pulses.  Pulmonary:     Effort: Pulmonary effort is normal. No respiratory distress.     Breath sounds: Normal breath sounds. No wheezing or rales.  Abdominal:     General: Bowel sounds are normal. There is no distension.     Palpations: Abdomen is soft.     Tenderness: There is abdominal tenderness.     Comments: Generalized tenderness to palpation  Musculoskeletal:     Cervical back: Normal range of motion.     Right lower leg: No edema.     Left lower leg: No edema.  Skin:    General: Skin is warm and dry.  Neurological:     General: No focal deficit present.     Mental Status: She is alert and oriented to person, place, and time.     Labs on Admission: I have personally reviewed following labs and imaging studies  CBC: Recent Labs  Lab 10/30/22 2029  WBC 13.9*  HGB 11.6*  HCT 35.3*  MCV 88.9  PLT [id number]   Basic Metabolic Panel: Recent Labs  Lab 10/30/22 2029  NA 133*  K 3.5  CL 103  CO2 23  GLUCOSE 101*  BUN 13  CREATININE 0.59  CALCIUM 8.6*   GFR: Estimated Creatinine Clearance: 78.2 mL/min (by C-G formula based on SCr of 0.59 mg/dL). Liver Function Tests: No results for input(s): "AST", "ALT", "ALKPHOS", "BILITOT", "PROT", "ALBUMIN" in the last 168 hours. No results for input(s): "LIPASE", "AMYLASE" in the last 168 hours. No results for input(s): "AMMONIA" in the last 168 hours. Coagulation Profile: No results for input(s): "INR", "PROTIME" in the last 168 hours. Cardiac Enzymes: No results for input(s): "CKTOTAL", "CKMB", "CKMBINDEX", "TROPONINI" in the last 168 hours. BNP (last 3 results) No results for input(s): "PROBNP" in the last 8760 hours. HbA1C: No results for input(s): "HGBA1C" in the last 72 hours. CBG: No results for  input(s): "GLUCAP" in the last 168 hours. Lipid Profile: No results for input(s): "CHOL", "HDL", "LDLCALC", "TRIG", "CHOLHDL", "LDLDIRECT" in the last 72 hours. Thyroid Function Tests: No results for input(s): "TSH", "T4TOTAL", "FREET4", "T3FREE", "THYROIDAB" in the last 72 hours. Anemia Panel: No results for input(s): "VITAMINB12", "FOLATE", "FERRITIN", "TIBC", "IRON", "RETICCTPCT" in the last 72 hours. Urine analysis:    Component Value Date/Time   COLORURINE YELLOW 10/30/2022 2020   APPEARANCEUR CLEAR 10/30/2022 2020   LABSPEC 1.011 10/30/2022 2020   PHURINE 5.0 10/30/2022 2020   GLUCOSEU NEGATIVE 10/30/2022 2020   HGBUR MODERATE (A) 10/30/2022 2020   BILIRUBINUR NEGATIVE 10/30/2022 2020   BILIRUBINUR negative 02/14/2020 1443   BILIRUBINUR small 04/06/2017 0913   KETONESUR NEGATIVE 10/30/2022 2020   PROTEINUR NEGATIVE 10/30/2022 2020   UROBILINOGEN 0.2 10/29/2022 1303   NITRITE NEGATIVE 10/30/2022 2020  LEUKOCYTESUR TRACE (A) 10/30/2022 2020    Radiological Exams on Admission: CT Renal Stone Study  Result Date: 10/30/2022 CLINICAL DATA:  Abdominal, flank pain.  Back pain. EXAM: CT ABDOMEN AND PELVIS WITHOUT CONTRAST TECHNIQUE: Multidetector CT imaging of the abdomen and pelvis was performed following the standard protocol without IV contrast. RADIATION DOSE REDUCTION: This exam was performed according to the departmental dose-optimization program which includes automated exposure control, adjustment of the mA and/or kV according to patient size and/or use of iterative reconstruction technique. COMPARISON:  None FINDINGS: Lower chest: No acute abnormality. Hepatobiliary: Cholelithiasis without pericholecystic inflammatory change. Liver unremarkable. No intra or extrahepatic biliary ductal dilation. Pancreas: Unremarkable Spleen: Unremarkable Adrenals/Urinary Tract: Adrenal glands are unremarkable. Kidneys are normal, without renal calculi, focal lesion, or hydronephrosis. Bladder is  unremarkable. Stomach/Bowel: There is focal, circumferential bowel wall thickening and pericolonic inflammatory stranding involving the distal sigmoid colon surrounding a moderate volume of stool which may reflect changes of an infectious, inflammatory, or stercoral colitis. Superimposed moderate sigmoid diverticulosis. Moderate colonic stool burden is present proximal to the region of inflammatory change. The stomach and small bowel are unremarkable. Appendix absent. No free intraperitoneal gas or fluid. Vascular/Lymphatic: Aortic atherosclerosis. No enlarged abdominal or pelvic lymph nodes. Reproductive: Uterus and bilateral adnexa are unremarkable. Other: No abdominal wall hernia Musculoskeletal: No acute bone abnormality. No lytic or blastic bone lesion. IMPRESSION: 1. Focal circumferential bowel wall thickening and pericolonic inflammatory stranding involving the distal sigmoid colon surrounding a moderate volume of stool which may reflect changes of an infectious, inflammatory, or stercoral colitis. Moderate colonic stool burden is present proximal to the region of inflammatory change. 2. Moderate sigmoid diverticulosis. 3. Cholelithiasis. Aortic Atherosclerosis (ICD10-I70.0). Electronically Signed   By: Fidela Salisbury M.D.   On: 10/30/2022 22:16   DG Abd 2 Views  Result Date: 10/29/2022 CLINICAL DATA:  Lower abdominal pain, back pain and dysuria for 3 days EXAM: ABDOMEN - 2 VIEW COMPARISON:  None Available. FINDINGS: Nonobstructive pattern of bowel gas with gas present to the distal colon. Large burden of stool in the colon. Possible gallstones, rib calcifications, or right renal calculi project in the right upper quadrant, assessment limited by overlying stool and bowel gas. There is no evidence of free air. No radio-opaque calculi or other significant radiographic abnormality is seen. IMPRESSION: 1. Nonobstructive pattern of bowel gas with gas present to the distal colon. Large burden of stool in the  colon. 2. Possible gallstones, rib calcifications, or right renal calculi project in the right upper quadrant, assessment limited by overlying stool and bowel gas. Consider CT in the setting of otherwise unexplained abdominal and back pain. Electronically Signed   By: Delanna Ahmadi M.D.   On: 10/29/2022 13:49    Assessment and Plan  Stercoral colitis Patient is reporting lower abdominal pain and constipation.  CT showing focal circumferential bowel wall thickening and pericolonic inflammatory stranding involving the distal sigmoid colon surrounding a moderate volume of stool which may reflect changes of an infectious, inflammatory, or stercoral colitis.  Also showing moderate colonic stool burden proximal to the region of inflammatory change.  WBC count 13.9, no fever or signs of sepsis.  Continue treatment with ceftriaxone and Flagyl.  MiraLAX ordered, avoid opiates for pain.  Monitor WBC count.  Dysuria/urinary symptoms Urine culture from 10/29/2022 showing no growth.  Repeat UA done tonight not suggestive of infection.  No renal calculi seen on CT.  Mild hyponatremia Patient received IV fluids, repeat labs.  DVT prophylaxis: SCDs Code Status:  Full Code (discussed with the patient) Level of care: Med-Surg Admission status: It is my clinical opinion that referral for OBSERVATION is reasonable and necessary in this patient based on the above information provided. The aforementioned taken together are felt to place the patient at high risk for further clinical deterioration. However, it is anticipated that the patient may be medically stable for discharge from the hospital within 24 to 48 hours.   Shela Leff MD [facility]  If 7PM-7AM, please contact night-coverage [email or web address]  10/31/2022, 3:59 AM

## 2022-10-31 NOTE — Plan of Care (Signed)
  Problem: Education: Goal: Knowledge of General Education information will improve Description: Including pain rating scale, medication(s)/side effects and non-pharmacologic comfort measures Outcome: Progressing   Problem: Activity: Goal: Risk for activity intolerance will decrease Outcome: Progressing   Problem: Pain Managment: Goal: General experience of comfort will improve Outcome: Progressing   

## 2022-10-31 NOTE — Hospital Course (Addendum)
Taken from H&P.  Kristen Holt is a 47 y.o. female with medical history significant of anemia, recurrent UTI, history of appendectomy. She was seen at urgent care on 10/29/2022 for bilateral lower abdominal and flank pain, along with some dysuria. Workup did not reveal UTI. There was concern for possible kidney stone and she was discharged with prescriptions for Flomax, ibuprofen, and Tylenol. She returns to the ED tonight with ongoing symptoms. Vital signs stable. Labs showing WBC 13.9, hemoglobin 11.6 (no significant change from baseline), sodium 133, creatinine 0.5, urine pregnancy test negative. UA with negative nitrite, trace leukocytes, and microscopy showing 0-5 WBCs, and no bacteria.   CT renal stone study showing: "IMPRESSION: 1. Focal circumferential bowel wall thickening and pericolonic inflammatory stranding involving the distal sigmoid colon surrounding a moderate volume of stool which may reflect changes of an infectious, inflammatory, or stercoral colitis. Moderate colonic stool burden is present proximal to the region of inflammatory change. 2. Moderate sigmoid diverticulosis. 3. Cholelithiasis.  Patient received Cipro and Flagyl in ED and continued on ceftriaxone and Flagyl.  4/1: Vital stable.  Urine culture negative.  Having intermittent abdominal pain but overall improving.  Had a small bowel movement-another dose of MiraLAX given.  Patient is being discharged on 3 more days of ciprofloxacin and Flagyl for concern of stercoral colitis.  She was also given tramadol to be used as needed.  Patient will continue with rest of her home medications and need to have a close follow-up with her providers for further recommendations.

## 2022-10-31 NOTE — Plan of Care (Signed)

## 2022-10-31 NOTE — ED Provider Notes (Signed)
[place] EMERGENCY DEPARTMENT AT [facility] Provider Note   CSN: [id number] Arrival date & time: 10/30/22  1937     History  Chief Complaint  Patient presents with   Flank Pain    Kristen Holt is a 47 y.o. female.  The history is provided by the patient. A language interpreter was used.  Flank Pain  She has complained of pain across her lower abdomen for the last week, getting worse.  Pain is worse with eating, worse with urinating.  She went to an urgent care center where she was given some pain medication and told to come to the emergency department if symptoms worsened.  She feels that she was hot at home but did not check her temperature.  She has had some chills and sweats.  She denies nausea, vomiting.  She had diarrhea about a week ago, no diarrhea since then and no constipation but she does admit that she has to strain with her bowel movements frequently.   Home Medications Prior to Admission medications   Medication Sig Start Date End Date Taking? Authorizing Provider  acetaminophen (TYLENOL) 500 MG tablet Take 1 tablet (500 mg total) by mouth every 6 (six) hours as needed. 10/29/22   Garrison, [place] N, FNP  ferrous sulfate 325 (65 FE) MG tablet Take 325 mg by mouth daily with breakfast.    [provider]  ibuprofen (ADVIL) 800 MG tablet Take 1 tablet (800 mg total) by mouth 3 (three) times daily. 10/29/22   Garrison, [place] N, FNP  tamsulosin (FLOMAX) 0.4 MG CAPS capsule Take 1 capsule (0.4 mg total) by mouth daily after supper for 10 days. 10/29/22 11/08/22  Garrison, [place] N, FNP      Allergies    Patient has no known allergies.    Review of Systems   Review of Systems  Genitourinary:  Positive for flank pain.  All other systems reviewed and are negative.   Physical Exam Updated Vital Signs BP 132/88   Pulse 87   Temp 98.5 F (36.9 C)   Resp 15   Ht 5\' 2"  (1.575 m)   Wt 65.8 kg   LMP 10/25/2022 Comment: negative urine  pregnancy test 10-30-2022  SpO2 95%   BMI 26.52 kg/m  Physical Exam Vitals and nursing note reviewed.   47 year old female, resting comfortably and in no acute distress. Vital signs are normal. Oxygen saturation is 95%, which is normal. Head is normocephalic and atraumatic. PERRLA, EOMI. Oropharynx is clear. Neck is nontender and supple without adenopathy or JVD. Back is nontender and there is no CVA tenderness. Lungs are clear without rales, wheezes, or rhonchi. Chest is nontender. Heart has regular rate and rhythm without murmur. Abdomen is soft, flat, with marked tenderness across the lower abdomen and into the left mid and upper abdomen.  There is no rebound or guarding. Extremities have no cyanosis or edema, full range of motion is present. Skin is warm and dry without rash. Neurologic: Mental status is normal, cranial nerves are intact, moves all extremities equally.  ED Results / Procedures / Treatments   Labs (all labs ordered are listed, but only abnormal results are displayed) Labs Reviewed  CBC - Abnormal; Notable for the following components:      Result Value   WBC 13.9 (*)    Hemoglobin 11.6 (*)    HCT 35.3 (*)    All other components within normal limits  BASIC METABOLIC PANEL - Abnormal; Notable for the following components:  Sodium 133 (*)    Glucose, Bld 101 (*)    Calcium 8.6 (*)    All other components within normal limits  URINALYSIS, ROUTINE W REFLEX MICROSCOPIC - Abnormal; Notable for the following components:   Hgb urine dipstick MODERATE (*)    Leukocytes,Ua TRACE (*)    All other components within normal limits  PREGNANCY, URINE    EKG None  Radiology CT Renal Stone Study  Result Date: 10/30/2022 CLINICAL DATA:  Abdominal, flank pain.  Back pain. EXAM: CT ABDOMEN AND PELVIS WITHOUT CONTRAST TECHNIQUE: Multidetector CT imaging of the abdomen and pelvis was performed following the standard protocol without IV contrast. RADIATION DOSE REDUCTION:  This exam was performed according to the departmental dose-optimization program which includes automated exposure control, adjustment of the mA and/or kV according to patient size and/or use of iterative reconstruction technique. COMPARISON:  None FINDINGS: Lower chest: No acute abnormality. Hepatobiliary: Cholelithiasis without pericholecystic inflammatory change. Liver unremarkable. No intra or extrahepatic biliary ductal dilation. Pancreas: Unremarkable Spleen: Unremarkable Adrenals/Urinary Tract: Adrenal glands are unremarkable. Kidneys are normal, without renal calculi, focal lesion, or hydronephrosis. Bladder is unremarkable. Stomach/Bowel: There is focal, circumferential bowel wall thickening and pericolonic inflammatory stranding involving the distal sigmoid colon surrounding a moderate volume of stool which may reflect changes of an infectious, inflammatory, or stercoral colitis. Superimposed moderate sigmoid diverticulosis. Moderate colonic stool burden is present proximal to the region of inflammatory change. The stomach and small bowel are unremarkable. Appendix absent. No free intraperitoneal gas or fluid. Vascular/Lymphatic: Aortic atherosclerosis. No enlarged abdominal or pelvic lymph nodes. Reproductive: Uterus and bilateral adnexa are unremarkable. Other: No abdominal wall hernia Musculoskeletal: No acute bone abnormality. No lytic or blastic bone lesion. IMPRESSION: 1. Focal circumferential bowel wall thickening and pericolonic inflammatory stranding involving the distal sigmoid colon surrounding a moderate volume of stool which may reflect changes of an infectious, inflammatory, or stercoral colitis. Moderate colonic stool burden is present proximal to the region of inflammatory change. 2. Moderate sigmoid diverticulosis. 3. Cholelithiasis. Aortic Atherosclerosis (ICD10-I70.0). Electronically Signed   By: Fidela Salisbury M.D.   On: 10/30/2022 22:16   DG Abd 2 Views  Result Date:  10/29/2022 CLINICAL DATA:  Lower abdominal pain, back pain and dysuria for 3 days EXAM: ABDOMEN - 2 VIEW COMPARISON:  None Available. FINDINGS: Nonobstructive pattern of bowel gas with gas present to the distal colon. Large burden of stool in the colon. Possible gallstones, rib calcifications, or right renal calculi project in the right upper quadrant, assessment limited by overlying stool and bowel gas. There is no evidence of free air. No radio-opaque calculi or other significant radiographic abnormality is seen. IMPRESSION: 1. Nonobstructive pattern of bowel gas with gas present to the distal colon. Large burden of stool in the colon. 2. Possible gallstones, rib calcifications, or right renal calculi project in the right upper quadrant, assessment limited by overlying stool and bowel gas. Consider CT in the setting of otherwise unexplained abdominal and back pain. Electronically Signed   By: Delanna Ahmadi M.D.   On: 10/29/2022 13:49    Procedures Procedures    Medications Ordered in ED Medications - No data to display  ED Course/ Medical Decision Making/ A&P                             Medical Decision Making Risk Prescription drug management. Decision regarding hospitalization.   Lower abdominal pain.  Differential diagnosis is broad and includes, but  is not limited to, diverticulitis, urinary tract infection, urolithiasis, pelvic inflammatory disease.  I have reviewed and interpreted her laboratory test, and my interpretation is mild hyponatremia which is not felt to be clinically significant, mild leukocytosis which is nonspecific, mild anemia which is new compared with 12/01/2020.  CT of abdomen shows focal circumferential bowel wall thickening and pericolonic inflammatory stranding involving the distal sigmoid colon surrounding a moderate volume of stool which may reflect changes of an infectious, inflammatory, or stercoral colitis.  I have independently viewed the images, and agree with the  radiologist's interpretation.  Also noted is moderate sigmoid diverticulosis and cholelithiasis.  Patient is in significant pain.  I observed her with marked guarding getting up from a sitting position, walking 3 steps to the bed, and even getting into the bed.  I do not feel she is stable for discharge at this point.  I have ordered morphine for pain, ondansetron for nausea, intravenous ciprofloxacin and metronidazole for colitis.  I discussed case with Dr. Marlowe Sax of [facility], who agrees to admit the patient.  Final Clinical Impression(s) / ED Diagnoses Final diagnoses:  Stercoral colitis  Hyponatremia  Normochromic normocytic anemia    Rx / DC Orders ED Discharge Orders     None         Delora Fuel, MD [id number] [phone]

## 2022-10-31 NOTE — Progress Notes (Signed)
No charge progress note.  Kristen Holt is a 47 y.o. female with medical history significant of anemia, recurrent UTI, history of appendectomy. She was seen at urgent care on 10/29/2022 for bilateral lower abdominal and flank pain, along with some dysuria. Workup did not reveal UTI. There was concern for possible kidney stone and she was discharged with prescriptions for Flomax, ibuprofen, and Tylenol. She returns to the ED tonight with ongoing symptoms. Vital signs stable. Labs showing WBC 13.9, hemoglobin 11.6 (no significant change from baseline), sodium 133, creatinine 0.5, urine pregnancy test negative. UA with negative nitrite, trace leukocytes, and microscopy showing 0-5 WBCs, and no bacteria.   CT renal stone study showing: "IMPRESSION: 1. Focal circumferential bowel wall thickening and pericolonic inflammatory stranding involving the distal sigmoid colon surrounding a moderate volume of stool which may reflect changes of an infectious, inflammatory, or stercoral colitis. Moderate colonic stool burden is present proximal to the region of inflammatory change. 2. Moderate sigmoid diverticulosis. 3. Cholelithiasis.  Patient received Cipro and Flagyl in ED and continued on ceftriaxone and Flagyl.  Patient continued to have some lower abdominal pain but overall stating that she is getting better.  Still no bowel movement.  Her exam was benign.  Will continue current management-if remains stable and continue to improve then discharge home tomorrow on p.o. antibiotics

## 2022-11-01 ENCOUNTER — Other Ambulatory Visit (HOSPITAL_COMMUNITY): Payer: Self-pay

## 2022-11-01 ENCOUNTER — Other Ambulatory Visit: Payer: Self-pay

## 2022-11-01 DIAGNOSIS — E871 Hypo-osmolality and hyponatremia: Secondary | ICD-10-CM

## 2022-11-01 DIAGNOSIS — D649 Anemia, unspecified: Secondary | ICD-10-CM

## 2022-11-01 MED ORDER — TRAMADOL HCL 50 MG PO TABS
50.0000 mg | ORAL_TABLET | Freq: Four times a day (QID) | ORAL | 0 refills | Status: DC | PRN
  Filled 2022-11-01: qty 30, 8d supply, fill #0

## 2022-11-01 MED ORDER — METRONIDAZOLE 500 MG PO TABS
500.0000 mg | ORAL_TABLET | Freq: Two times a day (BID) | ORAL | 0 refills | Status: AC
  Filled 2022-11-01: qty 6, 3d supply, fill #0

## 2022-11-01 MED ORDER — LACTATED RINGERS IV BOLUS
1000.0000 mL | Freq: Once | INTRAVENOUS | Status: AC
  Administered 2022-11-01: 1000 mL via INTRAVENOUS

## 2022-11-01 MED ORDER — POLYETHYLENE GLYCOL 3350 17 G PO PACK
17.0000 g | PACK | Freq: Every day | ORAL | 0 refills | Status: DC
  Filled 2022-11-01: qty 14, 14d supply, fill #0

## 2022-11-01 MED ORDER — CIPROFLOXACIN HCL 500 MG PO TABS
500.0000 mg | ORAL_TABLET | Freq: Two times a day (BID) | ORAL | 0 refills | Status: AC
  Filled 2022-11-01: qty 6, 3d supply, fill #0

## 2022-11-01 MED ORDER — ALUM & MAG HYDROXIDE-SIMETH 200-200-20 MG/5ML PO SUSP
30.0000 mL | Freq: Four times a day (QID) | ORAL | Status: DC | PRN
  Administered 2022-11-01: 30 mL via ORAL
  Filled 2022-11-01: qty 30

## 2022-11-01 NOTE — Discharge Summary (Signed)
Physician Discharge Summary   Patient: Kristen Holt MRN: [id number] DOB: [birth year]  Admit date:     10/30/2022  Discharge date: 11/01/22  Discharge Physician: Lorella Nimrod   PCP: Ladell Pier, MD   Recommendations at discharge:  Please obtain CBC and BMP in 1 week Follow-up with primary care provider within a week  Discharge Diagnoses: Principal Problem:   Stercoral colitis Active Problems:   Normochromic normocytic anemia   Hyponatremia   Hospital Course: Taken from H&P.  Kristen Holt is a 47 y.o. female with medical history significant of anemia, recurrent UTI, history of appendectomy. She was seen at urgent care on 10/29/2022 for bilateral lower abdominal and flank pain, along with some dysuria. Workup did not reveal UTI. There was concern for possible kidney stone and she was discharged with prescriptions for Flomax, ibuprofen, and Tylenol. She returns to the ED tonight with ongoing symptoms. Vital signs stable. Labs showing WBC 13.9, hemoglobin 11.6 (no significant change from baseline), sodium 133, creatinine 0.5, urine pregnancy test negative. UA with negative nitrite, trace leukocytes, and microscopy showing 0-5 WBCs, and no bacteria.   CT renal stone study showing: "IMPRESSION: 1. Focal circumferential bowel wall thickening and pericolonic inflammatory stranding involving the distal sigmoid colon surrounding a moderate volume of stool which may reflect changes of an infectious, inflammatory, or stercoral colitis. Moderate colonic stool burden is present proximal to the region of inflammatory change. 2. Moderate sigmoid diverticulosis. 3. Cholelithiasis.  Patient received Cipro and Flagyl in ED and continued on ceftriaxone and Flagyl.  4/1: Vital stable.  Urine culture negative.  Having intermittent abdominal pain but overall improving.  Had a small bowel movement-another dose of MiraLAX given.  Patient is being discharged on 3 more days  of ciprofloxacin and Flagyl for concern of stercoral colitis.  She was also given tramadol to be used as needed.  Patient will continue with rest of her home medications and need to have a close follow-up with her providers for further recommendations.   Consultants: None Procedures performed: None Disposition: Home Diet recommendation:  Discharge Diet Orders (From admission, onward)     Start     Ordered   11/01/22 0000  Diet - low sodium heart healthy        11/01/22 1013           Regular diet DISCHARGE MEDICATION: Allergies as of 11/01/2022   No Known Allergies      Medication List     STOP taking these medications    ibuprofen 800 MG tablet Commonly known as: ADVIL       TAKE these medications    acetaminophen 500 MG tablet Commonly known as: TYLENOL Take 1 tablet (500 mg total) by mouth every 6 (six) hours as needed. What changed: reasons to take this   ciprofloxacin 500 MG tablet Commonly known as: Cipro Take 1 tablet (500 mg total) by mouth 2 (two) times daily for 3 days.   ferrous sulfate 325 (65 FE) MG tablet Take 325 mg by mouth daily with breakfast.   metroNIDAZOLE 500 MG tablet Commonly known as: Flagyl Take 1 tablet (500 mg total) by mouth 2 (two) times daily for 3 days.   polyethylene glycol 17 g packet Commonly known as: MIRALAX / GLYCOLAX Take 17 g by mouth daily.   tamsulosin 0.4 MG Caps capsule Commonly known as: FLOMAX Take 1 capsule (0.4 mg total) by mouth daily after supper for 10 days.   traMADol 50 MG tablet Commonly known as: Veatrice Bourbon  Take 1 tablet (50 mg total) by mouth every 6 (six) hours as needed for moderate pain or severe pain.        Follow-up Information     Ladell Pier, MD. Schedule an appointment as soon as possible for a visit in 1 week(s).   Specialty: Internal Medicine Contact information: [address] [place] [address] [phone]                Discharge Exam: Danley Danker  Weights   10/30/22 2011  Weight: 65.8 kg   General.  Well-developed lady, in no acute distress. Pulmonary.  Lungs clear bilaterally, normal respiratory effort. CV.  Regular rate and rhythm, no JVD, rub or murmur. Abdomen.  Soft, nontender, nondistended, BS positive. CNS.  Alert and oriented .  No focal neurologic deficit. Extremities.  No edema, no cyanosis, pulses intact and symmetrical. Psychiatry.  Judgment and insight appears normal.   Condition at discharge: stable  The results of significant diagnostics from this hospitalization (including imaging, microbiology, ancillary and laboratory) are listed below for reference.   Imaging Studies: CT Renal Stone Study  Result Date: 10/30/2022 CLINICAL DATA:  Abdominal, flank pain.  Back pain. EXAM: CT ABDOMEN AND PELVIS WITHOUT CONTRAST TECHNIQUE: Multidetector CT imaging of the abdomen and pelvis was performed following the standard protocol without IV contrast. RADIATION DOSE REDUCTION: This exam was performed according to the departmental dose-optimization program which includes automated exposure control, adjustment of the mA and/or kV according to patient size and/or use of iterative reconstruction technique. COMPARISON:  None FINDINGS: Lower chest: No acute abnormality. Hepatobiliary: Cholelithiasis without pericholecystic inflammatory change. Liver unremarkable. No intra or extrahepatic biliary ductal dilation. Pancreas: Unremarkable Spleen: Unremarkable Adrenals/Urinary Tract: Adrenal glands are unremarkable. Kidneys are normal, without renal calculi, focal lesion, or hydronephrosis. Bladder is unremarkable. Stomach/Bowel: There is focal, circumferential bowel wall thickening and pericolonic inflammatory stranding involving the distal sigmoid colon surrounding a moderate volume of stool which may reflect changes of an infectious, inflammatory, or stercoral colitis. Superimposed moderate sigmoid diverticulosis. Moderate colonic stool burden is  present proximal to the region of inflammatory change. The stomach and small bowel are unremarkable. Appendix absent. No free intraperitoneal gas or fluid. Vascular/Lymphatic: Aortic atherosclerosis. No enlarged abdominal or pelvic lymph nodes. Reproductive: Uterus and bilateral adnexa are unremarkable. Other: No abdominal wall hernia Musculoskeletal: No acute bone abnormality. No lytic or blastic bone lesion. IMPRESSION: 1. Focal circumferential bowel wall thickening and pericolonic inflammatory stranding involving the distal sigmoid colon surrounding a moderate volume of stool which may reflect changes of an infectious, inflammatory, or stercoral colitis. Moderate colonic stool burden is present proximal to the region of inflammatory change. 2. Moderate sigmoid diverticulosis. 3. Cholelithiasis. Aortic Atherosclerosis (ICD10-I70.0). Electronically Signed   By: Fidela Salisbury M.D.   On: 10/30/2022 22:16   DG Abd 2 Views  Result Date: 10/29/2022 CLINICAL DATA:  Lower abdominal pain, back pain and dysuria for 3 days EXAM: ABDOMEN - 2 VIEW COMPARISON:  None Available. FINDINGS: Nonobstructive pattern of bowel gas with gas present to the distal colon. Large burden of stool in the colon. Possible gallstones, rib calcifications, or right renal calculi project in the right upper quadrant, assessment limited by overlying stool and bowel gas. There is no evidence of free air. No radio-opaque calculi or other significant radiographic abnormality is seen. IMPRESSION: 1. Nonobstructive pattern of bowel gas with gas present to the distal colon. Large burden of stool in the colon. 2. Possible gallstones, rib calcifications, or right  renal calculi project in the right upper quadrant, assessment limited by overlying stool and bowel gas. Consider CT in the setting of otherwise unexplained abdominal and back pain. Electronically Signed   By: Delanna Ahmadi M.D.   On: 10/29/2022 13:49    Microbiology: Results for orders placed  or performed during the hospital encounter of 10/29/22  Urine Culture     Status: None   Collection Time: 10/29/22  1:22 PM   Specimen: Urine, Clean Catch  Result Value Ref Range Status   Specimen Description URINE, CLEAN CATCH  Final   Special Requests NONE  Final   Culture   Final    NO GROWTH Performed at [facility], [place] [address], [place], [place] [address]    Report Status 10/30/2022 FINAL  Final    Labs: CBC: Recent Labs  Lab 10/30/22 2029 10/31/22 0510  WBC 13.9* 11.7*  HGB 11.6* 11.1*  HCT 35.3* 34.5*  MCV 88.9 89.1  PLT 340 [id number]   Basic Metabolic Panel: Recent Labs  Lab 10/30/22 2029 10/31/22 0510  NA 133* 137  K 3.5 3.7  CL 103 106  CO2 23 25  GLUCOSE 101* 110*  BUN 13 10  CREATININE 0.59 0.58  CALCIUM 8.6* 8.1*   Liver Function Tests: Recent Labs  Lab 10/31/22 0510  AST 18  ALT 16  ALKPHOS 62  BILITOT 0.6  PROT 6.6  ALBUMIN 3.5   CBG: No results for input(s): "GLUCAP" in the last 168 hours.  Discharge time spent: greater than 30 minutes.  This record has been created using Systems analyst. Errors have been sought and corrected,but may not always be located. Such creation errors do not reflect on the standard of care.   Signed: Lorella Nimrod, MD [facility] 11/01/2022

## 2022-11-01 NOTE — TOC CM/SW Note (Signed)
Transition of Care [facility]) Screening Note  Patient Details  Name: Kristen Holt Date of Birth: [birth year]  Transition of Care [facility]) CM/SW Contact:    Sherie Don, LCSW Phone Number: 11/01/2022, 8:53 AM  Transition of Care Department [facility]) has reviewed patient and no TOC needs have been identified at this time. We will continue to monitor patient advancement through interdisciplinary progression rounds. If new patient transition needs arise, please place a TOC consult.

## 2022-11-01 NOTE — Plan of Care (Signed)
  Problem: Coping: Goal: Level of anxiety will decrease Outcome: Progressing   Problem: Pain Managment: Goal: General experience of comfort will improve Outcome: Progressing   Problem: Safety: Goal: Ability to remain free from injury will improve Outcome: Progressing   

## 2022-11-02 ENCOUNTER — Ambulatory Visit: Payer: Self-pay

## 2022-11-02 ENCOUNTER — Telehealth: Payer: Self-pay

## 2022-11-02 NOTE — Telephone Encounter (Signed)
From the discharge call:  She said she feels much better now than she did this morning when she called the clinic. She said that she moved her bowels. She has no vomiting or diarrhea, just intermittent nausea, headaches and abdominal pain but better than she was feeling earlier.  She said she has all medications and did not have any questions about the meds or need to review the med list.  She did say that she thinks the medications are too strong and make her nauseous   Follow-up Provider: Geryl Rankins, NP - 11/03/2022.

## 2022-11-02 NOTE — Transitions of Care (Post Inpatient/ED Visit) (Signed)
   11/02/2022  Name: Kristen Holt MRN: [id number] DOB: 1976-01-12  Today's TOC FU Call Status: Today's TOC FU Call Status:: Successful TOC FU Call Competed TOC FU Call Complete Date: 11/02/22  Transition Care Management Follow-up Telephone Call Date of Discharge: 11/01/22 Discharge Facility: [facility]) Type of Discharge: Inpatient Admission Primary Inpatient Discharge Diagnosis:: stercoral colitis How have you been since you were released from the hospital?: Better Any questions or concerns?: Yes Patient Questions/Concerns:: She said she feels much better now than she did this morning when she called the clinic. She said that she moved her bowels. She has no vomiting or diarrhea, just intermittent nausea, headaches and abdominal pain but better than she was feeling earlier. Patient Questions/Concerns Addressed: Notified Provider of Patient Questions/Concerns  Items Reviewed: Did you receive and understand the discharge instructions provided?: Yes Medications obtained and verified?: Yes (Medications Reviewed) (She said she has all medications and did not have any questions about the meds or need to review the med list.  She did say that she thinks the medications are too strong and make her nauseous) Any new allergies since your discharge?: No Dietary orders reviewed?: Yes Type of Diet Ordered:: heart healthy.  She said she is only able to tolerate small portions of food before she feels nauseous. Do you have support at home?: Yes People in Home: child(ren), adult  Home Care and Equipment/Supplies: [place] Ordered?: No Any new equipment or medical supplies ordered?: No  Functional Questionnaire: Do you need assistance with bathing/showering or dressing?: No Do you need assistance with meal preparation?: Yes (daughter assists) Do you need assistance with eating?: No Do you have difficulty maintaining continence: No Do you need assistance with getting  out of bed/getting out of a chair/moving?: No Do you have difficulty managing or taking your medications?: Yes (Daughter assists if needed)  Follow up appointments reviewed: PCP Follow-up appointment confirmed?: Yes Date of PCP follow-up appointment?: 11/03/22 Follow-up Provider: Geryl Rankins, NP Specialist Hospital Follow-up appointment confirmed?: NA Do you need transportation to your follow-up appointment?: No Do you understand care options if your condition(s) worsen?: Yes-patient verbalized understanding    SIGNATURE  Eden Lathe, RN

## 2022-11-02 NOTE — Telephone Encounter (Addendum)
    Used Spanish interpreter Lisabeth Register [phone].  Chief Complaint: Pt. Discharged from hospital 11/01/22. Still having abdominal pain, bloating, constipation. Had small bowel movement this morning. Asking to be worked in for hospital follow up.  Symptoms: Above Frequency: 10/30/22 Pertinent Negatives: Patient denies  Disposition: [] ED /[] Urgent Care (no appt availability in office) / [] Appointment(In office/virtual)/ []  [place] [facility]/ [] Home Care/ [] Refused Recommended Disposition /[] [place] [facility]/ [x]  Follow-up with PCP Additional Notes: Instructed to go to ED for worsening of symptoms. Please advise pt.  Answer Assessment - Initial Assessment Questions 1. LOCATION: "Where does it hurt?"      Chest down to lower abdomen 2. RADIATION: "Does the pain shoot anywhere else?" (e.g., chest, back)     No 3. ONSET: "When did the pain begin?" (e.g., minutes, hours or days ago)      Today 4. SUDDEN: "Gradual or sudden onset?"     Gradual 5. PATTERN "Does the pain come and go, or is it constant?"    - If it comes and goes: "How long does it last?" "Do you have pain now?"     (Note: Comes and goes means the pain is intermittent. It goes away completely between bouts.)    - If constant: "Is it getting better, staying the same, or getting worse?"      (Note: Constant means the pain never goes away completely; most serious pain is constant and gets worse.)      Comes and goes 6. SEVERITY: "How bad is the pain?"  (e.g., Scale 1-10; mild, moderate, or severe)    - MILD (1-3): Doesn't interfere with normal activities, abdomen soft and not tender to touch.     - MODERATE (4-7): Interferes with normal activities or awakens from sleep, abdomen tender to touch.     - SEVERE (8-10): Excruciating pain, doubled over, unable to do any normal activities.       5 7. RECURRENT SYMPTOM: "Have you ever had this type of stomach pain before?" If Yes, ask: "When was the last time?" and "What happened that  time?"      Yes 8. CAUSE: "What do you think is causing the stomach pain?"     Constipation 9. RELIEVING/AGGRAVATING FACTORS: "What makes it better or worse?" (e.g., antacids, bending or twisting motion, bowel movement)     Bowel movement 10. OTHER SYMPTOMS: "Do you have any other symptoms?" (e.g., back pain, diarrhea, fever, urination pain, vomiting)       Feels bloated 11. PREGNANCY: "Is there any chance you are pregnant?" "When was your last menstrual period?"       No  Protocols used: Abdominal Pain - [facility]

## 2022-11-02 NOTE — Telephone Encounter (Addendum)
Patient was D'cd on 11/01/2022. Patient was told that she needed to follow up with PCP office ASAP. Patient voices that she still have some discomfort in her stomach and she has been taken the medication that was given to her on discharge.  Patient denies fever, vomiting or diarrhea Patient voices that she was  not been given any follow-up appointments for GI. [facility] to schedule appointment as she is a patient there already . Appointment given for 01/07/2023 at 3:30. Patient also given an appointment with provider at [facility] for tomorrow at 2:50 pm. As requested since appointment with GI is couple months away.  Interpreter [phone]

## 2022-11-03 ENCOUNTER — Encounter: Payer: Self-pay | Admitting: Nurse Practitioner

## 2022-11-03 ENCOUNTER — Ambulatory Visit: Payer: Self-pay | Attending: Nurse Practitioner | Admitting: Nurse Practitioner

## 2022-11-03 ENCOUNTER — Other Ambulatory Visit: Payer: Self-pay

## 2022-11-03 ENCOUNTER — Telehealth: Payer: Self-pay

## 2022-11-03 VITALS — BP 100/67 | HR 66 | Ht 62.0 in | Wt 157.8 lb

## 2022-11-03 DIAGNOSIS — R112 Nausea with vomiting, unspecified: Secondary | ICD-10-CM

## 2022-11-03 MED ORDER — ONDANSETRON 4 MG PO TBDP
4.0000 mg | ORAL_TABLET | Freq: Three times a day (TID) | ORAL | 1 refills | Status: DC | PRN
  Filled 2022-11-03: qty 30, 10d supply, fill #0

## 2022-11-03 MED ORDER — POLYETHYLENE GLYCOL 3350 17 G PO PACK
17.0000 g | PACK | Freq: Every day | ORAL | 6 refills | Status: AC
  Filled 2022-11-03: qty 14, 14d supply, fill #0

## 2022-11-03 NOTE — Telephone Encounter (Signed)
.   Interpreter# [phone] Call place to patient to ensure she was taken miralax . Unable to reach VM left.

## 2022-11-03 NOTE — Progress Notes (Signed)
Still feeling abdominal pain.

## 2022-11-03 NOTE — Progress Notes (Unsigned)
Assessment & Plan:  Kristen Holt was seen today for abdominal pain.  Diagnoses and all orders for this visit:   HFU Follow up with GI as instructed. Call if symptoms worsen: abdominal pain despite taking tramadol, vomiting despite taking zofran or persistent fever  Nausea and vomiting, unspecified vomiting type -     ondansetron (ZOFRAN-ODT) 4 MG disintegrating tablet; Take 1 tablet (4 mg total) by mouth every 8 (eight) hours as needed for nausea or vomiting. Do not chew or swallow. Place on tongue   Patient has been counseled on age-appropriate routine health concerns for screening and prevention. These are reviewed and up-to-date. Referrals have been placed accordingly. Immunizations are up-to-date or declined.    Subjective:   Chief Complaint  Patient presents with   Abdominal Pain   Hospitalization Follow-up   HPI Kristen Holt 47 y.o. female presents to office today for [place]  She has a medical history significant of anemia, recurrent UTI, history of appendectomy.    She was admitted to the hospital on 10-30-2022 and discharged on 11-01-2022 with flagyl, cipro and tramadol for pain. Diagnosed with stercoral colitis. as abdominal pain    ROS  Past Medical History:  Diagnosis Date   Acute appendicitis 01/13/2012   Anemia    COVID-19    Recurrent UTI    Thyroid disease     Past Surgical History:  Procedure Laterality Date   APPENDECTOMY     COLONOSCOPY     LAPAROSCOPIC APPENDECTOMY  01/18/2012   Procedure: APPENDECTOMY LAPAROSCOPIC;  Surgeon: Haywood Lasso, MD;  Location: [facility];  Service: General;  Laterality: N/A;    Family History  Problem Relation Age of Onset   Diabetes Mother    Healthy Father    Breast cancer Neg Hx    Colon cancer Neg Hx    Esophageal cancer Neg Hx    Rectal cancer Neg Hx    Stomach cancer Neg Hx     Social History Reviewed with no changes to be made today.   Outpatient Medications Prior to Visit  Medication Sig  Dispense Refill   acetaminophen (TYLENOL) 500 MG tablet Take 1 tablet (500 mg total) by mouth every 6 (six) hours as needed. (Patient taking differently: Take 500 mg by mouth every 6 (six) hours as needed for moderate pain.) 30 tablet 0   ciprofloxacin (CIPRO) 500 MG tablet Take 1 tablet (500 mg total) by mouth 2 (two) times daily for 3 days. 6 tablet 0   ferrous sulfate 325 (65 FE) MG tablet Take 325 mg by mouth daily with breakfast.     metroNIDAZOLE (FLAGYL) 500 MG tablet Take 1 tablet (500 mg total) by mouth 2 (two) times daily for 3 days. 6 tablet 0   polyethylene glycol (MIRALAX / GLYCOLAX) 17 g packet Take 17 g by mouth daily. 14 each 0   tamsulosin (FLOMAX) 0.4 MG CAPS capsule Take 1 capsule (0.4 mg total) by mouth daily after supper for 10 days. 30 capsule 0   traMADol (ULTRAM) 50 MG tablet Take 1 tablet (50 mg total) by mouth every 6 (six) hours as needed for moderate pain or severe pain. 30 tablet 0   No facility-administered medications prior to visit.    No Known Allergies     Objective:    BP 100/67   Pulse 66   Ht 5\' 2"  (1.575 m)   Wt 157 lb 12.8 oz (71.6 kg)   LMP 10/25/2022 (Exact Date) Comment: negative urine pregnancy test 10-30-2022  SpO2 98%   BMI 28.86 kg/m  Wt Readings from Last 3 Encounters:  11/03/22 157 lb 12.8 oz (71.6 kg)  10/30/22 145 lb (65.8 kg)  12/01/20 142 lb (64.4 kg)    Physical Exam       Patient has been counseled extensively about nutrition and exercise as well as the importance of adherence with medications and regular follow-up. The patient was given clear instructions to go to ER or return to medical center if symptoms don't improve, worsen or new problems develop. The patient verbalized understanding.   Follow-up: Return if symptoms worsen or fail to improve.   Gildardo Pounds, FNP-BC [facility] [place]   11/03/2022, 2:56 PM

## 2022-11-04 ENCOUNTER — Telehealth: Payer: Self-pay

## 2022-11-04 ENCOUNTER — Encounter: Payer: Self-pay | Admitting: Nurse Practitioner

## 2022-11-04 NOTE — Telephone Encounter (Signed)
Second attempt. Unable to reach . Message left on VM

## 2022-11-04 NOTE — Telephone Encounter (Signed)
Spoke with patient. Patient voiced that she is taking Miralax as ordered. Patient voiced that she is in the bathroom now and she is constipated.  Suggested to take miralax with warm liquid such as coffee, tea or warm water with lemon also to ensure she staying hydrated.Advised that she call us if constipation is not relieved. Patient voiced understanding of all discussed .

## 2022-11-04 NOTE — Telephone Encounter (Signed)
Copied from [place] [id number]. Topic: General - Other >> Nov 04, 2022  9:23 AM Everette C wrote: Reason for CRM: The patient has returned a missed call from C. Owens Shark RN  Please contact again when possible

## 2022-11-09 ENCOUNTER — Ambulatory Visit: Payer: Self-pay | Admitting: *Deleted

## 2022-11-09 ENCOUNTER — Other Ambulatory Visit: Payer: Self-pay

## 2022-11-09 NOTE — Telephone Encounter (Signed)
Interpreter: [phone] Chief Complaint: vaginal itching Symptoms: itching of vulva, mouth pain Frequency: 4 days Pertinent Negatives: Patient denies discharge Disposition: [] ED /[] Urgent Care (no appt availability in office) / [x] Appointment(In office/virtual)/ []  [facility]/ [] Home Care/ [] Refused Recommended Disposition /[] [place] [facility]/ []  Follow-up with PCP Additional Notes: Appointment has been scheduled- home care advise given until she can be seen.

## 2022-11-09 NOTE — Telephone Encounter (Signed)
Summary: vaginal itching, Vaginal odor   Vaginal itching, vaginal odor, sore tongue, no appetite  3 days Still having back pain, when taking meds it goes away but then it returns  (3/30) Often tired, dizzy sometimes, no energy (2 weeks)  No appointments until May         Reason for Disposition  MODERATE-SEVERE itching (i.e., interferes with school, work, or sleep)  Answer Assessment - Initial Assessment Questions 1. SYMPTOM: "What's the main symptom you're concerned about?" (e.g., pain, itching, dryness)     Vaginal itching 2. LOCATION: "Where is the  itching located?" (e.g., inside/outside, left/right)     outside 3. ONSET: "When did the  itching  start?"     4 days 4. PAIN: "Is there any pain?" If Yes, ask: "How bad is it?" (Scale: 1-10; mild, moderate, severe)   -  MILD (1-3): Doesn't interfere with normal activities.    -  MODERATE (4-7): Interferes with normal activities (e.g., work or school) or awakens from sleep.     -  SEVERE (8-10): Excruciating pain, unable to do any normal activities.     Mild/moderate 5. ITCHING: "Is there any itching?" If Yes, ask: "How bad is it?" (Scale: 1-10; mild, moderate, severe)     Yes, moderate 6. CAUSE: "What do you think is causing the discharge?" "Have you had the same problem before? What happened then?"     Recent antibiotic use 7. OTHER SYMPTOMS: "Do you have any other symptoms?" (e.g., fever, itching, vaginal bleeding, pain with urination, injury to genital area, vaginal foreign body)     No discharge  Protocols used: Vaginal Symptoms-A-AH, Vulvar Symptoms-A-AH

## 2022-11-10 ENCOUNTER — Ambulatory Visit: Payer: Self-pay | Admitting: Physician Assistant

## 2022-11-10 NOTE — Progress Notes (Deleted)
Patient ID: Kristen Holt, female   DOB: 07-29-1976, 47 y.o.   MRN: [id number]   After hospitalization 3/30-4/08/2022 Discharge Diagnoses: Principal Problem:   Stercoral colitis Active Problems:   Normochromic normocytic anemia   Hyponatremia     Hospital Course: Taken from H&P.   Kristen Holt is a 47 y.o. female with medical history significant of anemia, recurrent UTI, history of appendectomy. She was seen at urgent care on 10/29/2022 for bilateral lower abdominal and flank pain, along with some dysuria. Workup did not reveal UTI. There was concern for possible kidney stone and she was discharged with prescriptions for Flomax, ibuprofen, and Tylenol. She returns to the ED tonight with ongoing symptoms. Vital signs stable. Labs showing WBC 13.9, hemoglobin 11.6 (no significant change from baseline), sodium 133, creatinine 0.5, urine pregnancy test negative. UA with negative nitrite, trace leukocytes, and microscopy showing 0-5 WBCs, and no bacteria.   CT renal stone study showing: "IMPRESSION: 1. Focal circumferential bowel wall thickening and pericolonic inflammatory stranding involving the distal sigmoid colon surrounding a moderate volume of stool which may reflect changes of an infectious, inflammatory, or stercoral colitis. Moderate colonic stool burden is present proximal to the region of inflammatory change. 2. Moderate sigmoid diverticulosis. 3. Cholelithiasis.   Patient received Cipro and Flagyl in ED and continued on ceftriaxone and Flagyl.   4/1: Vital stable.  Urine culture negative.  Having intermittent abdominal pain but overall improving.  Had a small bowel movement-another dose of MiraLAX given.   Patient is being discharged on 3 more days of ciprofloxacin and Flagyl for concern of stercoral colitis.  She was also given tramadol to be used as needed.   Patient will continue with rest of her home medications and need to have a close follow-up with  her providers for further recommendations.

## 2022-11-10 NOTE — Progress Notes (Deleted)
Patient ID: Kristen Holt, female   DOB: 07/02/1976, 47 y.o.   MRN: [id number]   After hospitalization 3/30-4/08/2022 Discharge Diagnoses: Principal Problem:   Stercoral colitis Active Problems:   Normochromic normocytic anemia   Hyponatremia     Hospital Course: Taken from H&P.   [name] is a 47 y.o. female with medical history significant of anemia, recurrent UTI, history of appendectomy. She was seen at urgent care on 10/29/2022 for bilateral lower abdominal and flank pain, along with some dysuria. Workup did not reveal UTI. There was concern for possible kidney stone and she was discharged with prescriptions for Flomax, ibuprofen, and Tylenol. She returns to the ED tonight with ongoing symptoms. Vital signs stable. Labs showing WBC 13.9, hemoglobin 11.6 (no significant change from baseline), sodium 133, creatinine 0.5, urine pregnancy test negative. UA with negative nitrite, trace leukocytes, and microscopy showing 0-5 WBCs, and no bacteria.   CT renal stone study showing: "IMPRESSION: 1. Focal circumferential bowel wall thickening and pericolonic inflammatory stranding involving the distal sigmoid colon surrounding a moderate volume of stool which may reflect changes of an infectious, inflammatory, or stercoral colitis. Moderate colonic stool burden is present proximal to the region of inflammatory change. 2. Moderate sigmoid diverticulosis. 3. Cholelithiasis.   Patient received Cipro and Flagyl in ED and continued on ceftriaxone and Flagyl.   4/1: Vital stable.  Urine culture negative.  Having intermittent abdominal pain but overall improving.  Had a small bowel movement-another dose of MiraLAX given.   Patient is being discharged on 3 more days of ciprofloxacin and Flagyl for concern of stercoral colitis.  She was also given tramadol to be used as needed.   Patient will continue with rest of her home medications and need to have a close follow-up with  her providers for further recommendations. 

## 2022-11-11 ENCOUNTER — Ambulatory Visit: Payer: Self-pay | Admitting: Physician Assistant

## 2022-12-14 ENCOUNTER — Encounter: Payer: Self-pay | Admitting: Internal Medicine

## 2022-12-14 ENCOUNTER — Ambulatory Visit: Payer: Self-pay | Attending: Internal Medicine | Admitting: Internal Medicine

## 2022-12-14 ENCOUNTER — Other Ambulatory Visit: Payer: Self-pay

## 2022-12-14 VITALS — BP 100/66 | HR 71 | Temp 98.0°F | Ht 62.0 in | Wt 157.0 lb

## 2022-12-14 DIAGNOSIS — M545 Low back pain, unspecified: Secondary | ICD-10-CM

## 2022-12-14 DIAGNOSIS — M25552 Pain in left hip: Secondary | ICD-10-CM

## 2022-12-14 DIAGNOSIS — M25551 Pain in right hip: Secondary | ICD-10-CM

## 2022-12-14 DIAGNOSIS — G8929 Other chronic pain: Secondary | ICD-10-CM

## 2022-12-14 DIAGNOSIS — Z1331 Encounter for screening for depression: Secondary | ICD-10-CM

## 2022-12-14 DIAGNOSIS — L309 Dermatitis, unspecified: Secondary | ICD-10-CM

## 2022-12-14 MED ORDER — NYSTATIN-TRIAMCINOLONE 100000-0.1 UNIT/GM-% EX OINT
1.0000 | TOPICAL_OINTMENT | Freq: Two times a day (BID) | CUTANEOUS | 0 refills | Status: AC
  Filled 2022-12-14: qty 30, 15d supply, fill #0

## 2022-12-14 NOTE — Progress Notes (Signed)
Patient ID: Kristen Holt, female    DOB: [birth year]  MRN: [id number]  CC: Hospitalization Follow-up (Hosp f/u. Ottis Stain on hips, lower back, ovaries & vagina when doing any kind of exertion/Reports rash on buttocks, mainly on R side, burning / itching /Yes to pap for another appt. )   Subjective: Kristen Holt is a 47 y.o. female who presents for chronic ds management Her concerns today include:  Patient with history of DUB, anemia, hypothyroid that is normalized off medication.   [employer] Language interpreter used during this encounter. #Armando [phone]  Patient was seen by our nurse practitioner last month posthospitalization for colitis.  She has upcoming appointment with gastroenterologist next month.  Pt c/o rash on buttocks worse on LT side x 5 days Itchy, burning and red  C/o pain in hips and across lower back when moving constantly and working; thinks pain radiates to her ovaries as she gets pain across lower abdominal as well.  Does housekeeping for a living. Goes away with rest.  Takes Tylenol sometimes which helps   Positive depression screen today. Pt reports she is not depress or SI.  Thinks she misread the questions in filling out the questionnaire. Patient Active Problem List   Diagnosis Date Noted   Stercoral colitis 10/31/2022   Hyponatremia 10/31/2022   Rectal discomfort 12/02/2020   Normochromic normocytic anemia 12/15/2019   Constipation 12/15/2019   Fatigue 12/15/2019   Depression 01/01/2017   Hypothyroid 01/01/2017   Pap smear for cervical cancer screening 08/21/2013   Allergic rhinitis 02/01/2013   Acute appendicitis 01/13/2012     Current Outpatient Medications on File Prior to Visit  Medication Sig Dispense Refill   acetaminophen (TYLENOL) 500 MG tablet Take 1 tablet (500 mg total) by mouth every 6 (six) hours as needed. (Patient taking differently: Take 500 mg by mouth every 6 (six) hours as needed for moderate pain.) 30 tablet 0    polyethylene glycol (MIRALAX / GLYCOLAX) 17 g packet Take 17 g by mouth daily. FOR CONSTIPATION 14 each 6   ferrous sulfate 325 (65 FE) MG tablet Take 325 mg by mouth daily with breakfast. (Patient not taking: Reported on 12/14/2022)     ondansetron (ZOFRAN-ODT) 4 MG disintegrating tablet Take 1 tablet (4 mg total) by mouth every 8 (eight) hours as needed for nausea or vomiting. Do not chew or swallow. Place on tongue (Patient not taking: Reported on 12/14/2022) 30 tablet 1   traMADol (ULTRAM) 50 MG tablet Take 1 tablet (50 mg total) by mouth every 6 (six) hours as needed for moderate pain or severe pain. (Patient not taking: Reported on 12/14/2022) 30 tablet 0   No current facility-administered medications on file prior to visit.    No Known Allergies  Social History   Socioeconomic History   Marital status: Single    Spouse name: Not on file   Number of children: Not on file   Years of education: Not on file   Highest education level: Not on file  Occupational History   Not on file  Tobacco Use   Smoking status: Never   Smokeless tobacco: Never  Vaping Use   Vaping Use: Never used  Substance and Sexual Activity   Alcohol use: Yes    Comment: ocassionally    Drug use: No   Sexual activity: Yes    Partners: Male  Other Topics Concern   Not on file  Social History Narrative   ** Merged History Encounter **  Social Determinants of Health   Financial Resource Strain: Not on file  Food Insecurity: Not on file  Transportation Needs: Not on file  Physical Activity: Not on file  Stress: Not on file  Social Connections: Not on file  Intimate Partner Violence: Not on file    Family History  Problem Relation Age of Onset   Diabetes Mother    Healthy Father    Breast cancer Neg Hx    Colon cancer Neg Hx    Esophageal cancer Neg Hx    Rectal cancer Neg Hx    Stomach cancer Neg Hx     Past Surgical History:  Procedure Laterality Date   APPENDECTOMY     COLONOSCOPY      LAPAROSCOPIC APPENDECTOMY  01/18/2012   Procedure: APPENDECTOMY LAPAROSCOPIC;  Surgeon: Currie Paris, MD;  Location: [facility];  Service: General;  Laterality: N/A;    ROS: Review of Systems Negative except as stated above  PHYSICAL EXAM: BP 100/66 (BP Location: Left Arm, Patient Position: Sitting, Cuff Size: Normal)   Pulse 71   Temp 98 F (36.7 C) (Oral)   Ht 5\' 2"  (1.575 m)   Wt 157 lb (71.2 kg)   SpO2 100%   BMI 28.72 kg/m   Physical Exam  General appearance - alert, well appearing, and in no distress Mental status - normal mood, behavior, speech, dress, motor activity, and thought processes Musculoskeletal -patient has good flexion/extension/rotation of both hips.  No tenderness on palpation of the lumbar spine or surrounding paraspinal muscles. Skin -CMA Clarissa present: Patient has rash that consists of fine bumps noted on the left inner buttock close to the rectum.Marland Kitchen  No erythema noted.      Latest Ref Rng & Units 10/31/2022    5:10 AM 10/30/2022    8:29 PM 12/01/2020    4:38 PM  CMP  Glucose 70 - 99 mg/dL 161  096  99   BUN 6 - 20 mg/dL 10  13  9    Creatinine 0.44 - 1.00 mg/dL 0.45  4.09  8.11   Sodium 135 - 145 mmol/L 137  133  129   Potassium 3.5 - 5.1 mmol/L 3.7  3.5  3.8   Chloride 98 - 111 mmol/L 106  103  103   CO2 22 - 32 mmol/L 25  23    Calcium 8.9 - 10.3 mg/dL 8.1  8.6  8.9   Total Protein 6.5 - 8.1 g/dL 6.6   6.5   Total Bilirubin 0.3 - 1.2 mg/dL 0.6   <9.1   Alkaline Phos 38 - 126 U/L 62   61   AST 15 - 41 U/L 18   21   ALT 0 - 44 U/L 16      Lipid Panel  No results found for: "CHOL", "TRIG", "HDL", "CHOLHDL", "VLDL", "LDLCALC", "LDLDIRECT"  CBC    Component Value Date/Time   WBC 11.7 (H) 10/31/2022 0510   RBC 3.87 10/31/2022 0510   HGB 11.1 (L) 10/31/2022 0510   HGB 12.3 12/01/2020 1638   HCT 34.5 (L) 10/31/2022 0510   HCT 38.3 12/01/2020 1638   PLT 279 10/31/2022 0510   PLT 335 12/01/2020 1638   MCV 89.1 10/31/2022 0510   MCV 93  12/01/2020 1638   MCH 28.7 10/31/2022 0510   MCHC 32.2 10/31/2022 0510   RDW 14.2 10/31/2022 0510   RDW 13.2 12/01/2020 1638   LYMPHSABS 2.3 12/01/2020 1638   MONOABS 0.9 11/12/2019 1542   EOSABS 0.3 12/01/2020  1638   BASOSABS 0.1 12/01/2020 1638    ASSESSMENT AND PLAN:  1. Dermatitis - nystatin-triamcinolone ointment (MYCOLOG); Apply 1 Application topically 2 (two) times daily.  Dispense: 30 g; Refill: 0  2. Bilateral hip pain 3. Chronic bilateral low back pain without sciatica Most likely due to mechanical overuse as this occurs mainly when she is working.  Advised taking a break intermittently for 3 to 5 minutes when working to rest her joints and lower back.  Recommend use of Naprosyn or ibuprofen as needed.  4. Positive depression screening Patient reports that she is not depressed.  Does not feel she needs any medication and declines seeing a counselor as well.    Patient was given the opportunity to ask questions.  Patient verbalized understanding of the plan and was able to repeat key elements of the plan.   This documentation was completed using Paediatric nurse.  Any transcriptional errors are unintentional.  No orders of the defined types were placed in this encounter.    Requested Prescriptions    No prescriptions requested or ordered in this encounter    No follow-ups on file.  Jonah Blue, MD, FACP

## 2023-01-07 ENCOUNTER — Ambulatory Visit: Payer: Self-pay | Admitting: Gastroenterology

## 2023-02-10 ENCOUNTER — Ambulatory Visit
Admission: EM | Admit: 2023-02-10 | Discharge: 2023-02-10 | Disposition: A | Discharge status: Home / Self Care | Payer: Self-pay | Attending: Family Medicine | Admitting: Family Medicine

## 2023-02-10 DIAGNOSIS — N3090 Cystitis, unspecified without hematuria: Secondary | ICD-10-CM | POA: Insufficient documentation

## 2023-02-10 LAB — POCT URINALYSIS DIP (MANUAL ENTRY)
Bilirubin, UA: NEGATIVE
Glucose, UA: 100 mg/dL — AB
Ketones, POC UA: NEGATIVE mg/dL
Nitrite, UA: POSITIVE — AB
Protein Ur, POC: 30 mg/dL — AB
Spec Grav, UA: <=1.005 — AB (ref 1.010–1.025)
Urobilinogen, UA: 1 E.U./dL
pH, UA: 5.5 (ref 5.0–8.0)

## 2023-02-10 MED ORDER — NITROFURANTOIN MONOHYD MACRO 100 MG PO CAPS: 100.0000 mg | ORAL_CAPSULE | Freq: Two times a day (BID) | ORAL | 0 refills | Status: AC

## 2023-02-10 NOTE — ED Triage Notes (Signed)
Patient presents to UC for abdominal pain, dysuria, urinary freq since 4 days. States she took AZO. Symptoms have worsened.

## 2023-02-10 NOTE — Discharge Instructions (Signed)
The urine had some white blood cells and red blood cells and nitrites.  This could be signs of a urinary infection (en la orina fueron senales de infeccion: globulos blancos y rojos, y nitritos)  Take nitrofurantoin 100 mg--1 capsule 2 times daily for 5 days (Tome 1 capsula por la voca 2 veces al dia por 5 dias.)  We have sent the urine for culture, and staff will notify you if anything needs to be changed to any medication. ( Hemos mandado la orina para [place], y si el antibiotico necesita ser cambiado, le vamos a [employer] [facility])

## 2023-02-10 NOTE — ED Provider Notes (Signed)
[facility]    CSN: [id number] Arrival date & time: 02/10/23  1643      History   Chief Complaint Chief Complaint  Patient presents with   Abdominal Pain    HPI Kristen Holt is a 47 y.o. female.    Abdominal Pain Here for dysuria, urinary frequency and incomplete bladder emptying.  Symptoms began on July 7.  No fever or chills and no nausea or vomiting.  She is having some bloating feeling in her lower abdomen.  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   Hyponatremia 10/31/2022   Rectal discomfort 12/02/2020   Normochromic normocytic anemia 12/15/2019   Constipation 12/15/2019   Fatigue 12/15/2019   Depression 01/01/2017   Hypothyroid 01/01/2017   Pap smear for cervical cancer screening 08/21/2013   Allergic rhinitis 02/01/2013   Acute appendicitis 01/13/2012    Past Surgical History:  Procedure Laterality Date   APPENDECTOMY     COLONOSCOPY     LAPAROSCOPIC APPENDECTOMY  01/18/2012   Procedure: APPENDECTOMY LAPAROSCOPIC;  Surgeon: Currie Paris, MD;  Location: [facility] OR;  Service: General;  Laterality: N/A;    OB History     Gravida  5   Para  4   Term  4   Preterm      AB      Living  4      SAB      IAB      Ectopic      Multiple      Live Births               Home Medications    Prior to Admission medications   Medication Sig Start Date End Date Taking? Authorizing Provider  nitrofurantoin, macrocrystal-monohydrate, (MACROBID) 100 MG capsule Take 1 capsule (100 mg total) by mouth 2 (two) times daily for 5 days. 02/10/23 02/15/23 Yes Zenia Resides, MD  nystatin-triamcinolone ointment [facility]) Apply 1 Application topically 2 (two) times daily. 12/14/22   Marcine Matar, MD  polyethylene glycol (MIRALAX / GLYCOLAX) 17 g packet Take 17 g by mouth daily. FOR CONSTIPATION 11/03/22   Claiborne Rigg, NP    Family History Family History  Problem Relation Age of Onset   Diabetes Mother    Healthy Father    Breast cancer Neg Hx    Colon cancer Neg Hx    Esophageal cancer Neg Hx    Rectal cancer Neg Hx    Stomach cancer Neg Hx     Social History Social History   Tobacco Use   Smoking status: Never   Smokeless tobacco: Never  Vaping Use   Vaping status: Never Used  Substance Use Topics   Alcohol use: Yes    Comment: ocassionally    Drug use: No     Allergies   Patient has no known allergies.   Review of Systems Review of Systems  Gastrointestinal:  Positive for abdominal pain.     Physical Exam Triage Vital Signs ED Triage Vitals [02/10/23 1658]  Encounter Vitals Group     BP 114/73     Systolic BP Percentile      Diastolic BP Percentile      Pulse Rate  75     Resp 16     Temp 98.2 F (36.8 C)     Temp Source Oral     SpO2 95 %     Weight      Height      Head Circumference      Peak Flow      Pain Score      Pain Loc      Pain Education      Exclude from Growth Chart    No data found.  Updated Vital Signs BP 114/73 (BP Location: Left Arm)   Pulse 75   Temp 98.2 F (36.8 C) (Oral)   Resp 16   LMP 01/17/2023   SpO2 95%   Visual Acuity Right Eye Distance:   Left Eye Distance:   Bilateral Distance:    Right Eye Near:   Left Eye Near:    Bilateral Near:     Physical Exam Vitals reviewed.  Constitutional:      General: She is not in acute distress.    Appearance: She is not ill-appearing, toxic-appearing or diaphoretic.  HENT:     Mouth/Throat:     Mouth: Mucous membranes are moist.  Eyes:     Extraocular Movements: Extraocular movements intact.     Pupils: Pupils are equal, round, and reactive to light.  Cardiovascular:     Rate and Rhythm: Normal rate and regular rhythm.     Heart sounds:  No murmur heard. Pulmonary:     Effort: Pulmonary effort is normal.     Breath sounds: Normal breath sounds.  Abdominal:     Palpations: Abdomen is soft.     Tenderness: There is no abdominal tenderness.  Skin:    Coloration: Skin is not jaundiced or pale.  Neurological:     General: No focal deficit present.     Mental Status: She is alert and oriented to person, place, and time.  Psychiatric:        Behavior: Behavior normal.      UC Treatments / Results  Labs (all labs ordered are listed, but only abnormal results are displayed) Labs Reviewed  POCT URINALYSIS DIP (MANUAL ENTRY) - Abnormal; Notable for the following components:      Result Value   Color, UA orange (*)    Glucose, UA =100 (*)    Spec Grav, UA <=1.005 (*)    Blood, UA moderate (*)    Protein Ur, POC =30 (*)    Nitrite, UA Positive (*)    Leukocytes, UA Small (1+) (*)    All other components within normal limits  URINE CULTURE    EKG   Radiology No results found.  Procedures Procedures (including critical care time)  Medications Ordered in UC Medications - No data to display  Initial Impression / Assessment and Plan / UC Course  I have reviewed the triage vital signs and the nursing notes.  Pertinent labs & imaging results that were available during my care of the patient were reviewed by me and considered in my medical decision making (see chart for details).        Urinalysis shows red blood cells, leukocytes, and nitrites.  Urine culture is sent.  Treatment is sent with Macrodantin.  Final Clinical Impressions(s) / UC Diagnoses   Final diagnoses:  Cystitis     Discharge Instructions      The urine had some white blood cells and red blood cells and nitrites.  This could be signs of  a urinary infection (en la orina fueron senales de infeccion: globulos blancos y rojos, y nitritos)  Take nitrofurantoin 100 mg--1 capsule 2 times daily for 5 days (Tome 1 capsula por la voca 2 veces  al dia por 5 dias.)  We have sent the urine for culture, and staff will notify you if anything needs to be changed to any medication. ( Hemos mandado la orina para [place], y si el antibiotico necesita ser cambiado, le vamos a [employer] [facility])     ED Prescriptions     Medication Sig Dispense Auth. Provider   nitrofurantoin, macrocrystal-monohydrate, (MACROBID) 100 MG capsule Take 1 capsule (100 mg total) by mouth 2 (two) times daily for 5 days. 10 capsule Zenia Resides, MD      PDMP not reviewed this encounter.   Zenia Resides, MD 02/10/23 [phone]

## 2023-02-11 ENCOUNTER — Ambulatory Visit: Payer: Self-pay | Admitting: Internal Medicine

## 2023-02-13 LAB — URINE CULTURE: Culture: >=100000 — AB
